# Patient Record
Sex: Female | Born: 1935 | Race: White | Hispanic: No | Smoking: Never smoker
Health system: Southern US, Community
[De-identification: ages and names within clinical notes are randomized; demographics above are authoritative.]

## PROBLEM LIST (undated history)

## (undated) DIAGNOSIS — R600 Localized edema: Secondary | ICD-10-CM

## (undated) DIAGNOSIS — I509 Heart failure, unspecified: Secondary | ICD-10-CM

## (undated) DIAGNOSIS — R630 Anorexia: Secondary | ICD-10-CM

## (undated) DIAGNOSIS — N182 Chronic kidney disease, stage 2 (mild): Secondary | ICD-10-CM

## (undated) DIAGNOSIS — IMO0002 Reserved for concepts with insufficient information to code with codable children: Secondary | ICD-10-CM

## (undated) DIAGNOSIS — E119 Type 2 diabetes mellitus without complications: Secondary | ICD-10-CM

## (undated) DIAGNOSIS — K219 Gastro-esophageal reflux disease without esophagitis: Secondary | ICD-10-CM

## (undated) DIAGNOSIS — R0602 Shortness of breath: Secondary | ICD-10-CM

## (undated) DIAGNOSIS — I219 Acute myocardial infarction, unspecified: Secondary | ICD-10-CM

## (undated) DIAGNOSIS — E785 Hyperlipidemia, unspecified: Secondary | ICD-10-CM

## (undated) DIAGNOSIS — I639 Cerebral infarction, unspecified: Secondary | ICD-10-CM

## (undated) DIAGNOSIS — R413 Other amnesia: Secondary | ICD-10-CM

## (undated) DIAGNOSIS — M1990 Unspecified osteoarthritis, unspecified site: Secondary | ICD-10-CM

## (undated) DIAGNOSIS — I10 Essential (primary) hypertension: Secondary | ICD-10-CM

## (undated) DIAGNOSIS — I2510 Atherosclerotic heart disease of native coronary artery without angina pectoris: Secondary | ICD-10-CM

## (undated) HISTORY — PX: LUMBAR DISC SURGERY: SHX700

## (undated) HISTORY — PX: CHOLECYSTECTOMY: SHX55

## (undated) HISTORY — PX: ABDOMINAL HYSTERECTOMY: SHX81

## (undated) HISTORY — PX: BACK SURGERY: SHX140

## (undated) HISTORY — DX: Other amnesia: R41.3

## (undated) HISTORY — DX: Reserved for concepts with insufficient information to code with codable children: IMO0002

## (undated) HISTORY — PX: APPENDECTOMY: SHX54

## (undated) HISTORY — PX: CARPAL TUNNEL RELEASE: SHX101

## (undated) HISTORY — PX: HAMMER TOE SURGERY: SHX385

## (undated) HISTORY — PX: COLECTOMY: SHX59

## (undated) HISTORY — DX: Anorexia: R63.0

---

## 1999-02-10 ENCOUNTER — Encounter: Admission: RE | Admit: 1999-02-10 | Discharge: 1999-02-10 | Payer: Self-pay | Admitting: Internal Medicine

## 1999-02-10 ENCOUNTER — Encounter: Payer: Self-pay | Admitting: Internal Medicine

## 2000-12-26 ENCOUNTER — Ambulatory Visit (HOSPITAL_COMMUNITY): Admission: RE | Admit: 2000-12-26 | Discharge: 2000-12-26 | Payer: Self-pay | Admitting: Gastroenterology

## 2003-08-13 ENCOUNTER — Encounter: Admission: RE | Admit: 2003-08-13 | Discharge: 2003-08-13 | Payer: Self-pay | Admitting: Internal Medicine

## 2004-09-21 ENCOUNTER — Encounter: Admission: RE | Admit: 2004-09-21 | Discharge: 2004-09-21 | Payer: Self-pay | Admitting: Internal Medicine

## 2004-09-28 ENCOUNTER — Ambulatory Visit: Payer: Self-pay | Admitting: Gastroenterology

## 2004-10-14 ENCOUNTER — Ambulatory Visit: Payer: Self-pay | Admitting: Gastroenterology

## 2006-09-21 ENCOUNTER — Ambulatory Visit: Payer: Self-pay | Admitting: Vascular Surgery

## 2010-05-04 ENCOUNTER — Other Ambulatory Visit: Payer: Self-pay | Admitting: Internal Medicine

## 2010-05-04 DIAGNOSIS — R413 Other amnesia: Secondary | ICD-10-CM

## 2010-05-06 ENCOUNTER — Ambulatory Visit
Admission: RE | Admit: 2010-05-06 | Discharge: 2010-05-06 | Disposition: A | Discharge status: Home / Self Care | Payer: MEDICARE | Source: Ambulatory Visit | Attending: Internal Medicine | Admitting: Internal Medicine

## 2010-05-06 DIAGNOSIS — R413 Other amnesia: Secondary | ICD-10-CM

## 2010-06-28 NOTE — Procedures (Signed)
DUPLEX DEEP VENOUS EXAM - LOWER EXTREMITY   INDICATION:  Rule out DVT.   HISTORY:  Edema:  Right leg for 1 week  Trauma/Surgery:  No  Pain:  Right leg for 1 week  PE:  No  Previous DVT:  No  Anticoagulants:  No  Other:   DUPLEX EXAM:                CFV   SFV   PopV  PTV    GSV                R  L  R  L  R  L  R   L  R  L  Thrombosis    0  0  0  0  0  0  0   0  0  0  Spontaneous   +  +  +     +     +  Phasic        +  +  +     +     +  Augmentation  +  +  +     +     +  Compressible  +  +  +     +     +      +  Competent     +  +  +     +     +   Legend:  + - yes  o - no  p - partial  D - decreased   IMPRESSION:  1. No evidence of right leg DVT.  2. Right popliteal cyst in the distal thigh to popliteal fossa      measuring approximately 1.49 cm AP x      1.88 cm transverse.  3. Dr. Jacky Kindle was notified of these results.    _____________________________  Di Kindle. Edilia Bo, M.D.   DP/MEDQ  D:  09/21/2006  T:  09/22/2006  Job:  [id number]

## 2011-12-06 ENCOUNTER — Inpatient Hospital Stay (HOSPITAL_COMMUNITY)
Admission: AD | Admit: 2011-12-06 | Discharge: 2011-12-10 | DRG: 287 | Disposition: A | Discharge status: Home / Self Care | Payer: Medicare Other | Source: Ambulatory Visit | Attending: Cardiovascular Disease | Admitting: Cardiovascular Disease

## 2011-12-06 ENCOUNTER — Encounter (HOSPITAL_COMMUNITY): Payer: Self-pay | Admitting: Cardiology

## 2011-12-06 DIAGNOSIS — Z7982 Long term (current) use of aspirin: Secondary | ICD-10-CM

## 2011-12-06 DIAGNOSIS — Y9210 Unspecified residential institution as the place of occurrence of the external cause: Secondary | ICD-10-CM | POA: Diagnosis not present

## 2011-12-06 DIAGNOSIS — I2510 Atherosclerotic heart disease of native coronary artery without angina pectoris: Principal | ICD-10-CM | POA: Diagnosis present

## 2011-12-06 DIAGNOSIS — I129 Hypertensive chronic kidney disease with stage 1 through stage 4 chronic kidney disease, or unspecified chronic kidney disease: Secondary | ICD-10-CM | POA: Diagnosis present

## 2011-12-06 DIAGNOSIS — E119 Type 2 diabetes mellitus without complications: Secondary | ICD-10-CM | POA: Diagnosis present

## 2011-12-06 DIAGNOSIS — R600 Localized edema: Secondary | ICD-10-CM

## 2011-12-06 DIAGNOSIS — IMO0002 Reserved for concepts with insufficient information to code with codable children: Secondary | ICD-10-CM | POA: Diagnosis not present

## 2011-12-06 DIAGNOSIS — I10 Essential (primary) hypertension: Secondary | ICD-10-CM | POA: Diagnosis present

## 2011-12-06 DIAGNOSIS — N182 Chronic kidney disease, stage 2 (mild): Secondary | ICD-10-CM

## 2011-12-06 DIAGNOSIS — M129 Arthropathy, unspecified: Secondary | ICD-10-CM | POA: Diagnosis present

## 2011-12-06 DIAGNOSIS — K219 Gastro-esophageal reflux disease without esophagitis: Secondary | ICD-10-CM | POA: Diagnosis present

## 2011-12-06 DIAGNOSIS — Z8673 Personal history of transient ischemic attack (TIA), and cerebral infarction without residual deficits: Secondary | ICD-10-CM

## 2011-12-06 DIAGNOSIS — E785 Hyperlipidemia, unspecified: Secondary | ICD-10-CM

## 2011-12-06 DIAGNOSIS — E876 Hypokalemia: Secondary | ICD-10-CM | POA: Diagnosis present

## 2011-12-06 DIAGNOSIS — Y840 Cardiac catheterization as the cause of abnormal reaction of the patient, or of later complication, without mention of misadventure at the time of the procedure: Secondary | ICD-10-CM | POA: Diagnosis not present

## 2011-12-06 DIAGNOSIS — I200 Unstable angina: Secondary | ICD-10-CM | POA: Diagnosis present

## 2011-12-06 DIAGNOSIS — R609 Edema, unspecified: Secondary | ICD-10-CM | POA: Diagnosis present

## 2011-12-06 DIAGNOSIS — I2582 Chronic total occlusion of coronary artery: Secondary | ICD-10-CM | POA: Diagnosis present

## 2011-12-06 DIAGNOSIS — Z79899 Other long term (current) drug therapy: Secondary | ICD-10-CM

## 2011-12-06 HISTORY — DX: Chronic kidney disease, stage 2 (mild): N18.2

## 2011-12-06 HISTORY — DX: Hyperlipidemia, unspecified: E78.5

## 2011-12-06 HISTORY — DX: Unspecified osteoarthritis, unspecified site: M19.90

## 2011-12-06 HISTORY — DX: Atherosclerotic heart disease of native coronary artery without angina pectoris: I25.10

## 2011-12-06 HISTORY — DX: Cerebral infarction, unspecified: I63.9

## 2011-12-06 HISTORY — DX: Gastro-esophageal reflux disease without esophagitis: K21.9

## 2011-12-06 HISTORY — DX: Essential (primary) hypertension: I10

## 2011-12-06 HISTORY — DX: Localized edema: R60.0

## 2011-12-06 LAB — COMPREHENSIVE METABOLIC PANEL
BUN: 13 mg/dL (ref 6–23)
Calcium: 9.9 mg/dL (ref 8.4–10.5)
Creatinine, Ser: 0.68 mg/dL (ref 0.50–1.10)
GFR calc Af Amer: >90 mL/min (ref >90)
Glucose, Bld: 76 mg/dL (ref 70–99)
Sodium: 140 mEq/L (ref 135–145)
Total Protein: 6.1 g/dL (ref 6.0–8.3)

## 2011-12-06 LAB — PROTIME-INR
INR: 1 (ref 0.00–1.49)
Prothrombin Time: 13.1 seconds (ref 11.6–15.2)

## 2011-12-06 LAB — TROPONIN I: Troponin I: <0.3 ng/mL (ref <0.30)

## 2011-12-06 LAB — CBC WITH DIFFERENTIAL/PLATELET
Basophils Relative: 0 % (ref 0–1)
Eosinophils Absolute: 0.2 10*3/uL (ref 0.0–0.7)
Eosinophils Relative: 3 % (ref 0–5)
Hemoglobin: 12.1 g/dL (ref 12.0–15.0)
MCH: 29.5 pg (ref 26.0–34.0)
MCHC: 35.4 g/dL (ref 30.0–36.0)
Monocytes Relative: 9 % (ref 3–12)
Neutrophils Relative %: 64 % (ref 43–77)

## 2011-12-06 LAB — PRO B NATRIURETIC PEPTIDE: Pro B Natriuretic peptide (BNP): 579.5 pg/mL — ABNORMAL HIGH (ref 0–450)

## 2011-12-06 LAB — MAGNESIUM: Magnesium: 1.4 mg/dL — ABNORMAL LOW (ref 1.5–2.5)

## 2011-12-06 LAB — CK TOTAL AND CKMB (NOT AT ARMC): Relative Index: INVALID (ref 0.0–2.5)

## 2011-12-06 MED ORDER — OMEGA-3-ACID ETHYL ESTERS 1 G PO CAPS
1.0000 g | ORAL_CAPSULE | Freq: Two times a day (BID) | ORAL | Status: DC
  Administered 2011-12-06 – 2011-12-10 (×8): 1 g via ORAL
  Filled 2011-12-06 (×9): qty 1

## 2011-12-06 MED ORDER — ONDANSETRON HCL 4 MG/2ML IJ SOLN: 4.0000 mg | Freq: Four times a day (QID) | INTRAMUSCULAR | Status: DC | PRN

## 2011-12-06 MED ORDER — NITROGLYCERIN 0.4 MG SL SUBL: 0.4000 mg | SUBLINGUAL_TABLET | SUBLINGUAL | Status: DC | PRN

## 2011-12-06 MED ORDER — HEPARIN BOLUS VIA INFUSION
2500.0000 [IU] | Freq: Once | INTRAVENOUS | Status: AC
  Administered 2011-12-06: 2500 [IU] via INTRAVENOUS
  Filled 2011-12-06: qty 2500

## 2011-12-06 MED ORDER — PANTOPRAZOLE SODIUM 40 MG PO TBEC
40.0000 mg | DELAYED_RELEASE_TABLET | Freq: Every day | ORAL | Status: DC
  Administered 2011-12-07 – 2011-12-10 (×4): 40 mg via ORAL
  Filled 2011-12-06 (×4): qty 1

## 2011-12-06 MED ORDER — INSULIN ASPART 100 UNIT/ML ~~LOC~~ SOLN: 0.0000 [IU] | SUBCUTANEOUS | Status: DC

## 2011-12-06 MED ORDER — HEPARIN (PORCINE) IN NACL 100-0.45 UNIT/ML-% IJ SOLN
700.0000 [IU]/h | INTRAMUSCULAR | Status: DC
  Administered 2011-12-06: 550 [IU]/h via INTRAVENOUS
  Filled 2011-12-06 (×2): qty 250

## 2011-12-06 MED ORDER — ASPIRIN 81 MG PO CHEW
324.0000 mg | CHEWABLE_TABLET | ORAL | Status: AC
Start: 2011-12-07 — End: 2011-12-07
  Administered 2011-12-07: 324 mg via ORAL
  Filled 2011-12-06: qty 4

## 2011-12-06 MED ORDER — SODIUM CHLORIDE 0.9 % IJ SOLN: 3.0000 mL | INTRAMUSCULAR | Status: DC | PRN

## 2011-12-06 MED ORDER — METOPROLOL TARTRATE 25 MG PO TABS
25.0000 mg | ORAL_TABLET | Freq: Two times a day (BID) | ORAL | Status: DC
  Administered 2011-12-06 – 2011-12-09 (×6): 25 mg via ORAL
  Filled 2011-12-06 (×8): qty 1

## 2011-12-06 MED ORDER — SODIUM CHLORIDE 0.9 % IV SOLN: 250.0000 mL | INTRAVENOUS | Status: DC | PRN

## 2011-12-06 MED ORDER — ALPRAZOLAM 0.25 MG PO TABS: 0.2500 mg | ORAL_TABLET | Freq: Two times a day (BID) | ORAL | Status: DC | PRN

## 2011-12-06 MED ORDER — INSULIN ASPART 100 UNIT/ML ~~LOC~~ SOLN
0.0000 [IU] | Freq: Three times a day (TID) | SUBCUTANEOUS | Status: DC
  Administered 2011-12-08 – 2011-12-09 (×2): 1 [IU] via SUBCUTANEOUS
  Administered 2011-12-09: 13:00:00 via SUBCUTANEOUS
  Administered 2011-12-10 (×2): 2 [IU] via SUBCUTANEOUS

## 2011-12-06 MED ORDER — ASPIRIN EC 81 MG PO TBEC
81.0000 mg | DELAYED_RELEASE_TABLET | Freq: Every day | ORAL | Status: DC
  Administered 2011-12-08 – 2011-12-10 (×3): 81 mg via ORAL
  Filled 2011-12-06 (×4): qty 1

## 2011-12-06 MED ORDER — SODIUM CHLORIDE 0.9 % IV SOLN: INTRAVENOUS | Status: DC

## 2011-12-06 MED ORDER — ATORVASTATIN CALCIUM 10 MG PO TABS
10.0000 mg | ORAL_TABLET | Freq: Every day | ORAL | Status: DC
  Filled 2011-12-06: qty 1

## 2011-12-06 MED ORDER — SODIUM CHLORIDE 0.9 % IV SOLN
INTRAVENOUS | Status: DC
  Administered 2011-12-06: 20:00:00 via INTRAVENOUS

## 2011-12-06 MED ORDER — DIPHENHYDRAMINE HCL 25 MG PO CAPS: 25.0000 mg | ORAL_CAPSULE | Freq: Every evening | ORAL | Status: DC | PRN

## 2011-12-06 MED ORDER — ACETAMINOPHEN 325 MG PO TABS: 650.0000 mg | ORAL_TABLET | ORAL | Status: DC | PRN

## 2011-12-06 MED ORDER — SODIUM CHLORIDE 0.9 % IJ SOLN: 3.0000 mL | Freq: Two times a day (BID) | INTRAMUSCULAR | Status: DC

## 2011-12-06 NOTE — Progress Notes (Signed)
ANTICOAGULATION CONSULT NOTE - Initial Consult  Pharmacy Consult for heparin Indication: chest pain/ACS  No Known Allergies  Patient Measurements:   Heparin Dosing Weight: 40 kg  Vital Signs: Temp: 98.3 F (36.8 C) (10/23 1930) Temp src: Oral (10/23 1930) BP: 207/84 mmHg (10/23 1930) Pulse Rate: 66  (10/23 1930)  Labs: No results found for this basename: HGB:2,HCT:3,PLT:3,APTT:3,LABPROT:3,INR:3,HEPARINUNFRC:3,CREATININE:3,CKTOTAL:3,CKMB:3,TROPONINI:3 in the last 72 hours  CrCl is unknown because no creatinine reading has been taken and the patient has no height on file.   Medical History: Past Medical History  Diagnosis Date  . Hypertension   . Diabetes mellitus without complication   . Arthritis   . Stroke   . GERD (gastroesophageal reflux disease)   . Chronic kidney disease   . Unstable angina 12/06/2011  . CKD (chronic kidney disease) stage 3, GFR 30-59 ml/min 12/06/2011  . CKD (chronic kidney disease) stage 2, GFR 60-89 ml/min 12/06/2011  . Edema leg 12/06/2011  . Hyperlipidemia 12/06/2011    Medications:  Prescriptions prior to admission  Medication Sig Dispense Refill  . aspirin 81 MG chewable tablet Chew 81 mg by mouth daily.      . fish oil-omega-3 fatty acids 1000 MG capsule Take 1 g by mouth 2 (two) times daily.      Marland Kitchen glipiZIDE (GLUCOTROL) 10 MG tablet Take 10 mg by mouth daily.      . metFORMIN (GLUCOPHAGE) 500 MG tablet Take 500 mg by mouth 2 (two) times daily with a meal.      . Multiple Vitamin (MULTIVITAMIN WITH MINERALS) TABS Take 1 tablet by mouth daily.      Marland Kitchen omeprazole (PRILOSEC) 20 MG capsule Take 20 mg by mouth daily.      . propranolol-hydrochlorothiazide (INDERIDE) 40-25 MG per tablet Take 1 tablet by mouth daily.        Assessment: 78 year old woman to be admitted for ACS.  Heparin IV to start. Goal of Therapy:  Heparin level 0.3-0.7 units/ml Monitor platelets by anticoagulation protocol: Yes   Plan:  Give 2500 units bolus x  1 Start heparin infusion at 550 units/hr Check anti-Xa level in 8 hours and daily while on heparin Continue to monitor H&H and platelets  Mickeal Skinner 12/06/2011,7:46 PM

## 2011-12-06 NOTE — H&P (Signed)
Julia [name] is an 78 y.o. female.   Chief Complaint: chest pain, DOE  HPI: 78 yo, [name] with 2 week episodes of chest and neck pressure associated with DOE.  On Sunday several episodes while walking in [employer],   Significantly SOB, but with rest would resolve.  But every time she exerted the DOE would return along with neck and chest pressure.  Another episode occurred yesterday.   In the office with minimal activity she is symptom free.  She was seen by Dr. Allyson Sabal in the office and felt this was significant unstable angina and needed hospitalization.  She did have an echo here  In the office with normal EF and essentially normal Echo.  Admitting to CCU.  Past Medical History  Diagnosis Date  . Hypertension   . Diabetes mellitus without complication   . Arthritis   . Stroke   . GERD (gastroesophageal reflux disease)   . Chronic kidney disease   . Unstable angina 12/06/2011  . CKD (chronic kidney disease) stage 3, GFR 30-59 ml/min 12/06/2011  . CKD (chronic kidney disease) stage 2, GFR 60-89 ml/min 12/06/2011  . Edema leg 12/06/2011  . Hyperlipidemia 12/06/2011    History reviewed. No pertinent past surgical history.  History reviewed. No pertinent family history. Social History:  reports that she has never smoked. She has never used smokeless tobacco. She reports that she does not drink alcohol or use illicit drugs. Widowed 3 children, 5 grandchildren  Allergies: No Known Allergies  OUTPATIENT MEDICATIONS: ASA 81 mg daily Propanolol 40/25 mg daily glucophage 500 mg BID Multivitamin Nexium 40 mg daily Fish oil 1000mg  BID    No results found for this or any previous visit (from the past 48 hour(s)). No results found.  ROS: General:no colds or fevers,no wt changes  Skin:no rashes or ulcers HEENT:no blurred vision ZO:XWRUE pressure as described PUL:DOE GI:no nausea or vomiting, no indigestion though with hx of GERD, no diarrhea, no melena GU:no hematuria, no  dysuria MS:+ arthritis Neuro:near syncope with DOE on Sunday Endo:+ DM, no thyroid disease   VS: BP 150/80 Lt  150/80 Rt  P 88  R P  SP02 97%, room air,   Weight  88 pounds PE: General:Alert and oriented, pleasant affect, NAD Skin:warm and dry, brisk capillary refill HEENT:normocephalic, sclera clear Neck:supple, no JVD, no bruits Heart:S1S2 RRR, without murmur gallup rub or click Lungs:clear without rales, rhonchi or wheezes Abd:+ BS, soft non tender Ext:no edema Neuro:alert and oriented X 3, MAE, follows commands    Assessment/Plan Principal Problem:  *Unstable angina Active Problems:  HTN (hypertension)  CKD (chronic kidney disease) stage 2, GFR 60-89 ml/min  Edema leg  Hyperlipidemia  PLAN:admit, rule out MI with cardiac enzymes, IV heparin, sl NTG (though without ambulation hopefully no symptoms)  Will change propranolol to lopressor for now and hold HCTZ with cardiac cath for tomorrow. Admit to step down for close observation.  Hx stage 2 CKD and diabetes and hyperlipidemia which make for high risk CAD.   AVWUJW,JXBJY R 12/06/2011, 6:16 PM    Agree with note written by Julia [name] RNP  Pt with + CRF and s/o progressive DOE and sscp with presyncope. Exam benign. EKG w/o acute changes. Plan admit to step down, IV NTG/Hep. Diagnostic cath in AM. Preliminary 2D in office showed nl LV fxn with signif LVH.  Runell Gess 12/06/2011 9:28 PM

## 2011-12-07 ENCOUNTER — Encounter (HOSPITAL_COMMUNITY): Payer: Self-pay | Admitting: Cardiology

## 2011-12-07 ENCOUNTER — Encounter (HOSPITAL_COMMUNITY)
Admission: AD | Disposition: A | Discharge status: Home / Self Care | Payer: Self-pay | Source: Ambulatory Visit | Attending: Cardiovascular Disease

## 2011-12-07 ENCOUNTER — Ambulatory Visit (HOSPITAL_COMMUNITY): Admit: 2011-12-07 | Payer: Self-pay | Admitting: Cardiology

## 2011-12-07 HISTORY — PX: CARDIAC CATHETERIZATION: SHX172

## 2011-12-07 HISTORY — PX: LEFT HEART CATHETERIZATION WITH CORONARY ANGIOGRAM: SHX5451

## 2011-12-07 LAB — GLUCOSE, CAPILLARY
Glucose-Capillary: 67 mg/dL — ABNORMAL LOW (ref 70–99)
Glucose-Capillary: 68 mg/dL — ABNORMAL LOW (ref 70–99)
Glucose-Capillary: 73 mg/dL (ref 70–99)
Glucose-Capillary: 95 mg/dL (ref 70–99)
Glucose-Capillary: 96 mg/dL (ref 70–99)

## 2011-12-07 LAB — TROPONIN I: Troponin I: <0.3 ng/mL (ref <0.30)

## 2011-12-07 LAB — BASIC METABOLIC PANEL
CO2: 29 mEq/L (ref 19–32)
Glucose, Bld: 68 mg/dL — ABNORMAL LOW (ref 70–99)
Potassium: 3.1 mEq/L — ABNORMAL LOW (ref 3.5–5.1)
Sodium: 137 mEq/L (ref 135–145)

## 2011-12-07 LAB — HEMOGLOBIN A1C
Hgb A1c MFr Bld: 7 % — ABNORMAL HIGH (ref <5.7)
Mean Plasma Glucose: 154 mg/dL — ABNORMAL HIGH (ref <117)

## 2011-12-07 LAB — LIPID PANEL
Cholesterol: 207 mg/dL — ABNORMAL HIGH (ref 0–200)
HDL: 37 mg/dL — ABNORMAL LOW (ref >39)
Total CHOL/HDL Ratio: 5.6 RATIO
Triglycerides: 276 mg/dL — ABNORMAL HIGH (ref <150)
VLDL: 55 mg/dL — ABNORMAL HIGH (ref 0–40)

## 2011-12-07 LAB — CBC
HCT: 35.4 % — ABNORMAL LOW (ref 36.0–46.0)
MCH: 29.1 pg (ref 26.0–34.0)
MCHC: 34.7 g/dL (ref 30.0–36.0)
MCV: 83.9 fL (ref 78.0–100.0)
RDW: 12.3 % (ref 11.5–15.5)

## 2011-12-07 LAB — POCT ACTIVATED CLOTTING TIME: Activated Clotting Time: 135 seconds

## 2011-12-07 SURGERY — LEFT HEART CATHETERIZATION WITH CORONARY ANGIOGRAM
Anesthesia: LOCAL

## 2011-12-07 MED ORDER — TICAGRELOR 90 MG PO TABS
90.0000 mg | ORAL_TABLET | Freq: Two times a day (BID) | ORAL | Status: DC
  Administered 2011-12-08: 90 mg via ORAL
  Filled 2011-12-07 (×2): qty 1

## 2011-12-07 MED ORDER — BIVALIRUDIN 250 MG IV SOLR
INTRAVENOUS | Status: AC
  Filled 2011-12-07: qty 250

## 2011-12-07 MED ORDER — HEPARIN (PORCINE) IN NACL 2-0.9 UNIT/ML-% IJ SOLN
INTRAMUSCULAR | Status: AC
  Filled 2011-12-07: qty 1000

## 2011-12-07 MED ORDER — FENTANYL CITRATE 0.05 MG/ML IJ SOLN
INTRAMUSCULAR | Status: AC
  Filled 2011-12-07: qty 2

## 2011-12-07 MED ORDER — SODIUM CHLORIDE 0.9 % IV SOLN: 1.0000 mL/kg/h | INTRAVENOUS | Status: AC

## 2011-12-07 MED ORDER — SODIUM CHLORIDE 0.9 % IJ SOLN: 3.0000 mL | Freq: Two times a day (BID) | INTRAMUSCULAR | Status: DC

## 2011-12-07 MED ORDER — HEPARIN (PORCINE) IN NACL 100-0.45 UNIT/ML-% IJ SOLN
700.0000 [IU]/h | INTRAMUSCULAR | Status: DC
  Filled 2011-12-07: qty 250

## 2011-12-07 MED ORDER — HEPARIN (PORCINE) IN NACL 100-0.45 UNIT/ML-% IJ SOLN
700.0000 [IU]/h | INTRAMUSCULAR | Status: DC
  Administered 2011-12-08: 700 [IU]/h via INTRAVENOUS
  Filled 2011-12-07: qty 250

## 2011-12-07 MED ORDER — ATORVASTATIN CALCIUM 80 MG PO TABS
80.0000 mg | ORAL_TABLET | Freq: Every day | ORAL | Status: DC
  Administered 2011-12-07 – 2011-12-09 (×3): 80 mg via ORAL
  Filled 2011-12-07 (×4): qty 1

## 2011-12-07 MED ORDER — DEXTROSE-NACL 5-0.45 % IV SOLN
INTRAVENOUS | Status: DC
  Administered 2011-12-07: 09:00:00 via INTRAVENOUS

## 2011-12-07 MED ORDER — PANTOPRAZOLE SODIUM 40 MG PO TBEC: 40.0000 mg | DELAYED_RELEASE_TABLET | Freq: Every day | ORAL | Status: DC

## 2011-12-07 MED ORDER — ACETAMINOPHEN 325 MG PO TABS
650.0000 mg | ORAL_TABLET | ORAL | Status: DC | PRN
  Administered 2011-12-07: 650 mg via ORAL
  Filled 2011-12-07 (×2): qty 2

## 2011-12-07 MED ORDER — ONDANSETRON HCL 4 MG/2ML IJ SOLN: 4.0000 mg | Freq: Four times a day (QID) | INTRAMUSCULAR | Status: DC | PRN

## 2011-12-07 MED ORDER — ZOLPIDEM TARTRATE 5 MG PO TABS: 5.0000 mg | ORAL_TABLET | Freq: Every evening | ORAL | Status: DC | PRN

## 2011-12-07 MED ORDER — ASPIRIN 81 MG PO CHEW: 81.0000 mg | CHEWABLE_TABLET | Freq: Every day | ORAL | Status: DC

## 2011-12-07 MED ORDER — TICAGRELOR 90 MG PO TABS
180.0000 mg | ORAL_TABLET | Freq: Once | ORAL | Status: AC
  Administered 2011-12-07: 180 mg via ORAL
  Filled 2011-12-07: qty 2

## 2011-12-07 MED ORDER — MIDAZOLAM HCL 2 MG/2ML IJ SOLN
INTRAMUSCULAR | Status: AC
  Filled 2011-12-07: qty 2

## 2011-12-07 MED ORDER — NITROGLYCERIN 0.2 MG/ML ON CALL CATH LAB
INTRAVENOUS | Status: AC
  Filled 2011-12-07: qty 1

## 2011-12-07 MED ORDER — SODIUM CHLORIDE 0.9 % IV SOLN
250.0000 mL | INTRAVENOUS | Status: DC
  Administered 2011-12-08: 250 mL via INTRAVENOUS

## 2011-12-07 MED ORDER — POTASSIUM CHLORIDE CRYS ER 20 MEQ PO TBCR
40.0000 meq | EXTENDED_RELEASE_TABLET | Freq: Once | ORAL | Status: AC
  Administered 2011-12-07: 40 meq via ORAL
  Filled 2011-12-07: qty 2

## 2011-12-07 MED ORDER — SODIUM CHLORIDE 0.9 % IJ SOLN: 3.0000 mL | INTRAMUSCULAR | Status: DC | PRN

## 2011-12-07 MED ORDER — LIDOCAINE HCL (PF) 1 % IJ SOLN
INTRAMUSCULAR | Status: AC
  Filled 2011-12-07: qty 30

## 2011-12-07 MED ORDER — HEPARIN BOLUS VIA INFUSION
1000.0000 [IU] | Freq: Once | INTRAVENOUS | Status: AC
  Administered 2011-12-07: 1000 [IU] via INTRAVENOUS
  Filled 2011-12-07: qty 1000

## 2011-12-07 NOTE — CV Procedure (Signed)
[facility] CARDIAC CATHETERIZATION REPORT  NAME:  Julia [name]   MRN: [id number] DOB:  03-14-1927   ADMIT DATE: 12/06/2011  INTERVENTIONAL CARDIOLOGIST: Marykay Lex, M.D., MS PRIMARY CARE PROVIDER: Minda Meo, MD PRIMARY CARDIOLOGIST:   PATIENT:  Julia [name] is a 78 y.o. female with 2 week episodes of chest and neck pressure associated with DOE. On Sunday several episodes while walking in [employer], Significantly SOB, but with rest would resolve. But every time she exerted the DOE would return along with neck and chest pressure. Another episode occurred yesterday. In the office with minimal activity she is symptom free.  She was seen by Dr. Allyson Sabal in the office and felt this was significant unstable angina and needed hospitalization. She did have an echo In the office with normal EF and essentially normal Echo.  She was admitted with the plan for LHC with possible PCI today.  PRE-OPERATIVE DIAGNOSIS:    Unstable Angina  PROCEDURES PERFORMED:    Left Heart Catheterization with Native Coronary Angiography via 5 Fr Right Common Femoral Artery access  Left Ventriculography in RAO progection.  PROCEDURE: Consent: Risks of procedure as well as the alternatives and risks of each were explained to the (patient/caregiver).  Consent for procedure obtained. Consent for signed by MD and patient with RN witness -- placed on chart.  PROCEDURE: The patient was brought to the 2nd Floor [facility] in the fasting state and prepped and draped in the usual sterile fashion for Right groin access. Sterile technique was used including antiseptics, cap, gloves, gown, hand hygiene, mask and sheet.  Skin prep: Chlorhexidine  Time Out: Verified patient identification, verified procedure, site/side was marked, verified correct patient position, special equipment/implants available, medications/allergies/relevent history reviewed, required  imaging and test results available.  Performed  Access: Right Common Femora Artery; 5 Fr Sheath -- Fluoroscopically guided Seldinger Technique. Diagnostic:  Catheters advanced and exchanged over standard 0.35 J wire  Left Coronary Artery Angiography: JL4  Right Coronary Artery Angiography: JR4  LV Hemodynamics (LV Gram): Angled pigtail; LV Gram performed using hand injection of 12ml  Hemodynamics:  Central Aortic / Mean Pressures: 118/44 mmHg; 70 mmHg  Left Ventricular Pressures / EDP: 117/4 mmHg; 10 mmHg  Left Ventriculography:  EF: ~65 %  Wall Motion: normal  Coronary Anatomy:  Left Main: Large caliber vessel with an "upward takeoff" that bifurcates normally into the LAD and Circumflex; angiographically normal LAD: Large caliber vessel, heavily calcified proximally with mild luminal irregularities; shortly after a small SP1 and a larger SP2, the vessel is ~50-60% stenosed before and ~99-100% (subtotally occluded) after D1.  This bifurcation is calcified.  The distal LAD is filled via "homocollaterals" from the SP and D1 branches.  There is TIMI 1 flow from the "re-constitution" site (~12-42mm beyond the occlusion) distally to the apex  D1: Small to moderate caliber vessel, minimal ostial involvement in the LAD stenosis.  The vessel bifurcates with minimal luminal irregularities. Left Circumflex: Large caliber vessel with 2 small OM branches proximally before giving off OM1 in the AV Groove.  The vessel then continues in the AV Groove where it bifurcates into OM2 and the Left posterolateral system with OM3 and a bifurcating LPL (LPL1&2)  OM1: Small caliber, proximal ~50%  OM 2: Small to moderate caliber, proximal eccentric ~60%  OM 3: Small to moderate caliber, minimal luminal irregularities.  RCA: Moderate-to-large Caliber, "domininant" vessel with ~50-60% irregular stenosis in the mid vessel just proximal to the "  genu".  The vessel continues on as the RPDA with a minimal RPL  branch providing the AV Nodal artery.  RPDA: moderate caliber vessel that tapers to the apex; mid ~50-60% short tubular stenosis, otherwise minimal luminal irregularities.  ANESTHESIA:   Local Lidocaine 16 ml SEDATION:  1 mg IV Versed, 25 mcg IV fentanyl MEDICATIONS: Omnipaque 55ml  EBL:   < 10 ml  PATIENT DISPOSITION:    The patient was transferred to the PACU holding area in a hemodynamicaly stable, chest pain free condition.  The patient tolerated the procedure well, and there were no complications.  The patient was stable before, during, and after the procedure.  The Sheath will be removed in the PACU with direct manual pressure for hemostasis  POST-OPERATIVE DIAGNOSIS:    Severe single vessel CAD with what appears to be sub-acute subtotal occlusion of the mid LAD after the take-off of D1 and SP2 in a calcified segment of vessel.    Moderate mid RCA stenosis  Preserved LVEF with normal LVEDP with no WMA noted on LV Gram or Echocardiogram  With normal LAD territory wall motion, there is reason to presume that this distribution is viable and is therefore the most obvious "culprit lesion"  PLAN OF CARE:  At the time of planning to move forward with possible PCI of the LAD, I was alerted to a potential emergency "acute MI" patient in the [facility] ER.  As [name] condition is far less critical, I felt if prudent to abort the plan to proceed with PCI, in order to be available for the ER patient.  After it was determined that ER patient could be stabilized without emergent catheterization, I reviewed the films again to decide whether to proceed with PCI.  I am somewhat concerned about the extent of calcification at the occlusion site, and feel that it is prudent to post-pone the PCI until the films can be reviewed by my colleague, Dr. Tresa Endo, to determine if Rotablator therapy may be beneficial for this lesion.    Standard post-diagnostic catheterization care,restarting Heparin  8 hrs after hemostasis  Will load with Brilinta tonite, and as Dr. Tresa Endo to review th films in the AM to plan "staged" PCI of the Mid LAD and determine if Rotablation will be necessary.  I did discuss the case in detail with the patient and her son & daughter.  I have also discussed her with Drs. Allyson Sabal and Jacky Kindle who are in agreement with the plan.   Marykay Lex, M.D., M.S. [facility] [address] [place], [place]  [address]  [phone]  12/07/2011 7:53 PM

## 2011-12-07 NOTE — Progress Notes (Signed)
Inpatient Diabetes Program Recommendations  AACE/ADA: New Consensus Statement on Inpatient Glycemic Control (2013)  Target Ranges:  Prepandial:   less than 140 mg/dL      Peak postprandial:   less than 180 mg/dL (1-2 hours)      Critically ill patients:  140 - 180 mg/dL   Novolog orders need clarification.  TID and Q 4 coverage is entered.  So far today, patient is not needing any insulin.    Inpatient Diabetes Program Recommendations Correction (SSI): DC Sensitive correction scale TID if patient is NPO use Q 4  Thank you  Piedad Climes RN,BSN,CDE Inpatient Diabetes Coordinator

## 2011-12-07 NOTE — Progress Notes (Signed)
ANTICOAGULATION CONSULT NOTE  Pharmacy Consult for heparin Indication: chest pain/ACS  No Known Allergies  Patient Measurements: Height: 4\' 8"  (142.2 cm) Weight: 85 lb 12.1 oz (38.9 kg) IBW/kg (Calculated) : 36.3  Heparin Dosing Weight: 40 kg  Vital Signs: Temp: 98.4 F (36.9 C) (10/24 1154) Temp src: Oral (10/24 1154) BP: 163/54 mmHg (10/24 1154) Pulse Rate: 52  (10/24 1437)  Labs:  Basename 12/07/11 0800 12/07/11 0314 12/07/11 0313 12/06/11 2203 12/06/11 2202  HGB 12.3 -- -- -- 12.1  HCT 35.4* -- -- -- 34.2*  PLT 186 -- -- -- 191  APTT -- -- -- -- 38*  LABPROT -- 13.7 -- -- 13.1  INR -- 1.06 -- -- 1.00  HEPARINUNFRC 0.53 0.23* -- -- --  CREATININE -- 0.65 -- -- 0.68  CKTOTAL -- -- -- 57 --  CKMB -- -- -- 3.1 --  TROPONINI <0.30 -- <0.30 <0.30 --    Estimated Creatinine Clearance: 31.1 ml/min (by C-G formula based on Cr of 0.65).  Assessment: 78 year old female with chest pain was admitted for cardiac work up.  She was started on IV heparin last night at heparin rate of 700 units/hr.  Her heparin level this morning was 0.54 which is within the desired goal range.  She is without noted bleeding complications and plans are for cardiac cath.  Her CBC is stable as well as her platelets.  Goal of Therapy:  Heparin level 0.3-0.7 units/ml Monitor platelets by anticoagulation protocol: Yes   Plan:  1.  Continue current heparin therapy at 700 units/hr 2.  F/U after cath   Nadara Mustard, PharmD., MS Clinical Pharmacist Pager:  [phone] Thank you for allowing pharmacy to be part of this patients care team. 12/07/2011,3:13 PM

## 2011-12-07 NOTE — Progress Notes (Signed)
To the cath lab by bed. Stable. 

## 2011-12-07 NOTE — Progress Notes (Signed)
Pt CBG 67 at 0340. Pt given 4 oz orange juice. Pt CBG 89 at 0400. Pt resting comfortably. Will continue to monitor.

## 2011-12-07 NOTE — Progress Notes (Signed)
ANTICOAGULATION CONSULT NOTE  Pharmacy Consult for heparin Indication: chest pain/ACS  No Known Allergies   Labs:  Basename 12/07/11 0800 12/07/11 0314 12/07/11 0313 12/06/11 2203 12/06/11 2202  HGB 12.3 -- -- -- 12.1  HCT 35.4* -- -- -- 34.2*  PLT 186 -- -- -- 191  APTT -- -- -- -- 38*  LABPROT -- 13.7 -- -- 13.1  INR -- 1.06 -- -- 1.00  HEPARINUNFRC 0.53 0.23* -- -- --  CREATININE -- 0.65 -- -- 0.68  CKTOTAL -- -- -- 57 --  CKMB -- -- -- 3.1 --  TROPONINI <0.30 -- <0.30 <0.30 --    Estimated Creatinine Clearance: 31.1 ml/min (by C-G formula based on Cr of 0.65).  Assessment: 78 year old female with chest pain was admitted for cardiac work up.    S/p cath with restart of heparin 8 hours s/p sheath removal  Goal of Therapy:  Heparin level 0.3-0.7 units/ml Monitor platelets by anticoagulation protocol: Yes   Plan:  1.  Continue current heparin therapy at 700 units/hr (to start at 3 AM) 2.  PCI planned for tomorrow at 12 noon  Okey Regal, PharmD [phone]  12/07/2011,5:38 PM

## 2011-12-07 NOTE — Progress Notes (Signed)
ANTICOAGULATION CONSULT NOTE  Pharmacy Consult for heparin Indication: chest pain/ACS  No Known Allergies  Patient Measurements: Height: 4\' 8"  (142.2 cm) Weight: 88 lb (39.917 kg) IBW/kg (Calculated) : 36.3  Heparin Dosing Weight: 40 kg  Vital Signs: Temp: 97.8 F (36.6 C) (10/24 0338) Temp src: Oral (10/24 0338) BP: 142/58 mmHg (10/24 0338) Pulse Rate: 54  (10/24 0338)  Labs:  Basename 12/07/11 0314 12/07/11 0313 12/06/11 2203 12/06/11 2202  HGB -- -- -- 12.1  HCT -- -- -- 34.2*  PLT -- -- -- 191  APTT -- -- -- 38*  LABPROT 13.7 -- -- 13.1  INR 1.06 -- -- 1.00  HEPARINUNFRC 0.23* -- -- --  CREATININE 0.65 -- -- 0.68  CKTOTAL -- -- 57 --  CKMB -- -- 3.1 --  TROPONINI -- <0.30 <0.30 --    Estimated Creatinine Clearance: 31.1 ml/min (by C-G formula based on Cr of 0.65).  Assessment: 78 year old gemale with chest pain for Heparin. Goal of Therapy:  Heparin level 0.3-0.7 units/ml Monitor platelets by anticoagulation protocol: Yes   Plan:  Heparin 1000 units IV bolus, then increase heparin 700 units/hr F/U after cath  Lafayette Dunlevy, Gary Fleet 12/07/2011,4:37 AM

## 2011-12-07 NOTE — Progress Notes (Addendum)
[facility] DAILY PROGRESS NOTE  NAME:  Julia [name]  MRN: [id number] DOB:  07/14/1927   ADMIT DATE: 12/06/2011  Patient Description   78 y.o. female with PMH below: presented with SSx c/w unstable (crescendo) angina -- DOE & chest / neck pressure with minimal exertion.   Past Medical History  Diagnosis Date  . Hypertension   . Diabetes mellitus without complication   . Arthritis   . Stroke   . GERD (gastroesophageal reflux disease)   . Chronic kidney disease   . Unstable angina 12/06/2011  . CKD (chronic kidney disease) stage 2, GFR 60-89 ml/min 12/06/2011  . Edema leg 12/06/2011  . Hyperlipidemia 12/06/2011   Length of Stay:  LOS: 1 day   Subjective:   Today Julia [name] is resting comfortably.  No CP or SOB overnight while on bed rest. Did have a leg cramp last PM.  Objective:  Temp:  [97.3 F (36.3 C)-98.3 F (36.8 C)] 97.8 F (36.6 C) (10/24 0338) Pulse Rate:  [52-66] 57  (10/24 0400) Resp:  [15-25] 19  (10/24 0400) BP: (134-207)/(47-84) 165/47 mmHg (10/24 0801) SpO2:  [95 %-99 %] 95 % (10/24 0400) Weight:  [38.9 kg (85 lb 12.1 oz)-39.917 kg (88 lb)] 38.9 kg (85 lb 12.1 oz) (10/24 0500) Weight change:  Physical Exam: General appearance: alert, cooperative, appears stated age and thin & frail elderly woman. Lungs: clear to auscultation bilaterally and normal percussion bilaterally Heart: regular rate and rhythm, S1, S2 normal, no murmur, click, rub or gallop Extremities: extremities normal, atraumatic, no cyanosis or edema Pulses: 2+ and symmetric; no femoral bruit.  Intake/Output from previous day: 10/23 0701 - 10/24 0700 In: 329.5 [I.V.:329.5] Out: 2 [Urine:2]  Intake/Output Summary (Last 24 hours) at 12/07/11 0803 Last data filed at 12/07/11 0700  Gross per 24 hour  Intake  329.5 ml  Output      2 ml  Net  327.5 ml    Lab 12/07/11 0314 12/06/11 2202  NA 137 140  K 3.1* 3.0*  CL 100 103  CO2 29 28  BUN 13 13    CREATININE 0.65 0.68     Lab 12/06/11 2202  WBC 5.6  HGB 12.1  HCT 34.2*  PLT 191   No components found with this basename: TROP:3, BNP:3  Imaging:  Echo: from office - nl LVEF & no WMA.  MAR Reviewed  Assessment/Plan:  Principal Problem:  *Unstable angina Active Problems:  HTN (hypertension)  CKD (chronic kidney disease) stage 2, GFR 60-89 ml/min  Edema leg  Hyperlipidemia  Admitted overnight for SSx concerning for progressive / crescendo angina -- Unstable Angina -- following clinic visit with Dr. Allyson Sabal.   Hydrated o/n due to ~CKD2.   Plan is LHC +/- PCI today. Will replete K+.  Performing MD:  Marykay Lex, M.D., M.S.  Procedure:  Left Heart Catheterization with Coronary Angiography +/- Percutaneous Coronary Intervention.  The procedure with Risks/Benefits/Alternatives and Indications was reviewed with the patient.  All questions were answered.    Risks / Complications include, but not limited to: Death, MI, CVA/TIA, VF/VT (with defibrillation), Bradycardia (need for temporary pacer placement), contrast induced nephropathy - increased risk with CKD2, bleeding / bruising / hematoma / pseudoaneurysm, vascular or coronary injury (with possible emergent CT or Vascular Surgery), adverse medication reactions, infection.    The patient voice understanding and agree to proceed.   I have signed the consent form and placed it on the chart for patient signature  and RN witness.    Marykay Lex, M.D., M.S. [facility] [address] [place], [place]  [address]  [phone]  12/07/2011 8:05 AM   Time Spent Directly with Patient:  15 minutes

## 2011-12-08 ENCOUNTER — Encounter (HOSPITAL_COMMUNITY)
Admission: AD | Disposition: A | Discharge status: Home / Self Care | Payer: Self-pay | Source: Ambulatory Visit | Attending: Cardiovascular Disease

## 2011-12-08 ENCOUNTER — Encounter (HOSPITAL_COMMUNITY): Payer: Self-pay | Admitting: Cardiology

## 2011-12-08 DIAGNOSIS — I2510 Atherosclerotic heart disease of native coronary artery without angina pectoris: Secondary | ICD-10-CM

## 2011-12-08 DIAGNOSIS — E876 Hypokalemia: Secondary | ICD-10-CM | POA: Diagnosis present

## 2011-12-08 HISTORY — PX: CORONARY ANGIOPLASTY WITH STENT PLACEMENT: SHX49

## 2011-12-08 HISTORY — DX: Atherosclerotic heart disease of native coronary artery without angina pectoris: I25.10

## 2011-12-08 HISTORY — PX: PERCUTANEOUS CORONARY STENT INTERVENTION (PCI-S): SHX5485

## 2011-12-08 LAB — BASIC METABOLIC PANEL
BUN: 10 mg/dL (ref 6–23)
CO2: 22 mEq/L (ref 19–32)
Chloride: 98 mEq/L (ref 96–112)
GFR calc Af Amer: >90 mL/min (ref >90)
Glucose, Bld: 112 mg/dL — ABNORMAL HIGH (ref 70–99)
Potassium: 3.5 mEq/L (ref 3.5–5.1)
Sodium: 134 mEq/L — ABNORMAL LOW (ref 135–145)

## 2011-12-08 LAB — CBC
HCT: 35.7 % — ABNORMAL LOW (ref 36.0–46.0)
HCT: 37.4 % (ref 36.0–46.0)
Hemoglobin: 12.7 g/dL (ref 12.0–15.0)
Hemoglobin: 13.5 g/dL (ref 12.0–15.0)
MCH: 29.9 pg (ref 26.0–34.0)
MCHC: 35.6 g/dL (ref 30.0–36.0)
MCHC: 36.1 g/dL — ABNORMAL HIGH (ref 30.0–36.0)
RBC: 4.29 MIL/uL (ref 3.87–5.11)
RBC: 4.51 MIL/uL (ref 3.87–5.11)
WBC: 7.7 10*3/uL (ref 4.0–10.5)
WBC: 8.4 10*3/uL (ref 4.0–10.5)

## 2011-12-08 LAB — GLUCOSE, CAPILLARY
Glucose-Capillary: 122 mg/dL — ABNORMAL HIGH (ref 70–99)
Glucose-Capillary: 73 mg/dL (ref 70–99)
Glucose-Capillary: 96 mg/dL (ref 70–99)

## 2011-12-08 LAB — POCT ACTIVATED CLOTTING TIME: Activated Clotting Time: 539 seconds

## 2011-12-08 LAB — HEMOGLOBIN AND HEMATOCRIT, BLOOD
HCT: 31.1 % — ABNORMAL LOW (ref 36.0–46.0)
Hemoglobin: 10.9 g/dL — ABNORMAL LOW (ref 12.0–15.0)

## 2011-12-08 SURGERY — PERCUTANEOUS CORONARY STENT INTERVENTION (PCI-S)
Anesthesia: LOCAL | Laterality: Bilateral

## 2011-12-08 MED ORDER — DIAZEPAM 2 MG PO TABS
2.0000 mg | ORAL_TABLET | ORAL | Status: AC
  Administered 2011-12-08: 2 mg via ORAL
  Filled 2011-12-08: qty 1

## 2011-12-08 MED ORDER — FENTANYL CITRATE 0.05 MG/ML IJ SOLN
INTRAMUSCULAR | Status: AC
  Filled 2011-12-08: qty 2

## 2011-12-08 MED ORDER — ISOSORBIDE MONONITRATE ER 30 MG PO TB24
30.0000 mg | ORAL_TABLET | Freq: Every day | ORAL | Status: DC
  Administered 2011-12-08 – 2011-12-10 (×3): 30 mg via ORAL
  Filled 2011-12-08 (×3): qty 1

## 2011-12-08 MED ORDER — SODIUM CHLORIDE 0.9 % IJ SOLN: 3.0000 mL | INTRAMUSCULAR | Status: DC | PRN

## 2011-12-08 MED ORDER — ASPIRIN 81 MG PO CHEW
324.0000 mg | CHEWABLE_TABLET | ORAL | Status: DC
Start: 2011-12-09 — End: 2011-12-08
  Filled 2011-12-08: qty 4

## 2011-12-08 MED ORDER — ASPIRIN EC 81 MG PO TBEC: 81.0000 mg | DELAYED_RELEASE_TABLET | Freq: Every day | ORAL | Status: DC

## 2011-12-08 MED ORDER — ACETAMINOPHEN 325 MG PO TABS: 650.0000 mg | ORAL_TABLET | ORAL | Status: DC | PRN

## 2011-12-08 MED ORDER — POTASSIUM CHLORIDE CRYS ER 20 MEQ PO TBCR
40.0000 meq | EXTENDED_RELEASE_TABLET | Freq: Two times a day (BID) | ORAL | Status: DC
  Administered 2011-12-08 – 2011-12-09 (×3): 40 meq via ORAL
  Filled 2011-12-08 (×4): qty 2

## 2011-12-08 MED ORDER — SODIUM CHLORIDE 0.9 % IV SOLN: INTRAVENOUS | Status: DC

## 2011-12-08 MED ORDER — SODIUM CHLORIDE 0.9 % IJ SOLN
3.0000 mL | Freq: Two times a day (BID) | INTRAMUSCULAR | Status: DC
Start: 2011-12-08 — End: 2011-12-08

## 2011-12-08 MED ORDER — RANOLAZINE ER 500 MG PO TB12
500.0000 mg | ORAL_TABLET | Freq: Two times a day (BID) | ORAL | Status: DC
  Administered 2011-12-08 – 2011-12-10 (×4): 500 mg via ORAL
  Filled 2011-12-08 (×5): qty 1

## 2011-12-08 MED ORDER — ONDANSETRON HCL 4 MG/2ML IJ SOLN: 4.0000 mg | Freq: Four times a day (QID) | INTRAMUSCULAR | Status: DC | PRN

## 2011-12-08 MED ORDER — ATROPINE SULFATE 1 MG/ML IJ SOLN
INTRAMUSCULAR | Status: AC
  Filled 2011-12-08: qty 1

## 2011-12-08 MED ORDER — BIVALIRUDIN 250 MG IV SOLR
INTRAVENOUS | Status: AC
  Filled 2011-12-08: qty 250

## 2011-12-08 MED ORDER — SODIUM CHLORIDE 0.9 % IV SOLN
250.0000 mL | INTRAVENOUS | Status: DC
Start: 2011-12-08 — End: 2011-12-08

## 2011-12-08 MED ORDER — LIDOCAINE HCL (PF) 1 % IJ SOLN
INTRAMUSCULAR | Status: AC
  Filled 2011-12-08: qty 30

## 2011-12-08 MED ORDER — HEPARIN (PORCINE) IN NACL 2-0.9 UNIT/ML-% IJ SOLN
INTRAMUSCULAR | Status: AC
  Filled 2011-12-08: qty 1000

## 2011-12-08 MED ORDER — HYDRALAZINE HCL 20 MG/ML IJ SOLN: 10.0000 mg | Freq: Once | INTRAMUSCULAR | Status: DC

## 2011-12-08 MED ORDER — NITROGLYCERIN 0.2 MG/ML ON CALL CATH LAB
INTRAVENOUS | Status: AC
  Filled 2011-12-08: qty 1

## 2011-12-08 MED ORDER — MIDAZOLAM HCL 2 MG/2ML IJ SOLN
INTRAMUSCULAR | Status: AC
  Filled 2011-12-08: qty 2

## 2011-12-08 NOTE — Progress Notes (Signed)
Pt's rt. groin still bruised and swollen. Pressure held for 30 mins. Site is soft and tender. Site also auscultated to chest for bruit. Swishing sound heard over the Rt. Femoral artery. 2 more nurses asked to auscultate the site and also confirmed  they heard bruit. Wilburt Finlay ,PA on call notified  and ordered to check for H and H. Ice pack place over the area with instruction to keep the rt leg still. Pt VS has remained stable. No complaints of pain. Will continue to frequently monitor patient. Pt instructed to call if she feel any different and if she starts to have back pain.Chrisandra Wiemers [place]

## 2011-12-08 NOTE — Care Management Note (Signed)
    Page 1 of 1   12/08/2011     12:10:35 PM   CARE MANAGEMENT NOTE 12/08/2011  Patient:  Julia [name], Julia [name]   Account Number:  [id number]  Date Initiated:  12/08/2011  Documentation initiated by:  Junius Creamer  Subjective/Objective Assessment:   adm  w angina     Action/Plan:   lives w fam, pcp dr Jacky Kindle   Anticipated DC Date:     Anticipated DC Plan:        DC Planning Services  CM consult      Choice offered to / List presented to:             Status of service:   Medicare Important Message given?   (If response is "NO", the following Medicare IM given date fields will be blank) Date Medicare IM given:   Date Additional Medicare IM given:    Discharge Disposition:    Per UR Regulation:  Reviewed for med. necessity/level of care/duration of stay  If discussed at Long Length of Stay Meetings, dates discussed:    Comments:  10/25 12n debbie Shaymus Eveleth rn,bsn [phone] gave pt brilinta card for 30days free and copay assist that may help w meds.

## 2011-12-08 NOTE — Progress Notes (Signed)
Right groin sheath removed with BP_146/55 tolerated well. Below the site noted to get puffy while manual pressure is applied.  Site pressed by 2 staff for 20 min. Puffiness subsided and soft to touch. Pressure dressing applied and instructed to call for any sign of bleeding and bedrest emphasized. Continue to monitor. Latest BP 130/55.

## 2011-12-08 NOTE — CV Procedure (Signed)
Attempted PCI to chronically occluded severely calcified LAD  Tonna Corner, 78 y.o., female  Full note dictated.  DICTATION # [id number], [id number]  Unsuccessful attempt at PCI due to never able to cross calcified chronic occlusion. 3.0 XB LAD guide, multiple wires including PT2 moderate support, Fielder XT, Whisper, and [place] all 300 cm with 1.25x10 Sprinter balloon.  Mid distal LAD collateralized via septals with TIMI 3 flow.  Pt tolerated well with stable hemodynamics and no evidence for dissection or perforation.   REC:  Increased medical therapy with beta blocker, nitrates, ranexa, and possibly amlodipine.  Consider elective CABG if fails medical therapy.  [name], MD, [facility] 12/08/2011 4:50 PM

## 2011-12-08 NOTE — Progress Notes (Signed)
Back from the cath lab sleepy, right groin sheath intact. Bedrest emphasized and not to bend right knee.

## 2011-12-08 NOTE — Progress Notes (Signed)
Courtesy visit

## 2011-12-08 NOTE — Progress Notes (Signed)
To cath lab by bed stable. 

## 2011-12-08 NOTE — Progress Notes (Signed)
Subjective: Mild chest tightness last pm, no complaints currently  Objective: Vital signs in last 24 hours: Temp:  [97.8 F (36.6 C)-98.4 F (36.9 C)] 98.4 F (36.9 C) (10/25 0352) Pulse Rate:  [49-69] 60  (10/25 0352) Resp:  [15-25] 18  (10/25 0352) BP: (125-163)/(40-63) 147/61 mmHg (10/25 0352) SpO2:  [97 %-99 %] 98 % (10/25 0352) Weight:  [38.8 kg (85 lb 8.6 oz)] 38.8 kg (85 lb 8.6 oz) (10/25 0500) Weight change: -1.116 kg (-2 lb 7.4 oz)   Intake/Output from previous day:+195 10/24 0701 - 10/25 0700 In: 806.3 [I.V.:806.3] Out: 651 [Urine:651] Intake/Output this shift:    PE: General:alert and oriented, mild chest tightness last pm Heart:S1S2 RRR Lungs:clear  ZOX:WRUE non tender Ext:no edema, rt groin without hematoma    Lab Results:  Basename 12/08/11 0458 12/07/11 0800  WBC 7.7 6.8  HGB 12.7 12.3  HCT 35.7* 35.4*  PLT 199 186   BMET  Basename 12/07/11 0314 12/06/11 2202  NA 137 140  K 3.1* 3.0*  CL 100 103  CO2 29 28  GLUCOSE 68* 76  BUN 13 13  CREATININE 0.65 0.68  CALCIUM 9.4 9.9    Basename 12/07/11 0800 12/07/11 0313  TROPONINI <0.30 <0.30    Lab Results  Component Value Date   CHOL 207* 12/07/2011   HDL 37* 12/07/2011   LDLCALC 115* 12/07/2011   TRIG 276* 12/07/2011   CHOLHDL 5.6 12/07/2011   Lab Results  Component Value Date   HGBA1C 7.0* 12/06/2011     Lab Results  Component Value Date   TSH 1.764 12/06/2011    Hepatic Function Panel  Basename 12/06/11 2202  PROT 6.1  ALBUMIN 3.2*  AST 19  ALT 14  ALKPHOS 58  BILITOT 0.9  BILIDIR --  IBILI --    Basename 12/07/11 0314  CHOL 207*   No results found for this basename: PROTIME in the last 72 hours    EKG: Orders placed during the hospital encounter of 12/06/11  . EKG 12-LEAD  . EKG 12-LEAD  . EKG 12-LEAD    Studies/Results: Cardiac cath: POST-OPERATIVE DIAGNOSIS:  Severe single vessel CAD with what appears to be sub-acute subtotal occlusion of the mid LAD  after the take-off of D1 and SP2 in a calcified segment of vessel.  Moderate mid RCA stenosis  Preserved LVEF with normal LVEDP with no WMA noted on LV Gram or Echocardiogram  With normal LAD territory wall motion, there is reason to presume that this distribution is viable and is therefore the most obvious "culprit lesion"    Medications: I have reviewed the patient's current medications.    Marland Kitchen aspirin EC  81 mg Oral Daily  . atorvastatin  80 mg Oral q1800  . bivalirudin      . fentaNYL      . heparin      . insulin aspart  0-9 Units Subcutaneous TID WC  . lidocaine      . metoprolol tartrate  25 mg Oral BID  . midazolam      . nitroGLYCERIN      . omega-3 acid ethyl esters  1 g Oral BID  . pantoprazole  40 mg Oral Q1200  . pantoprazole  40 mg Oral Daily  . potassium chloride  40 mEq Oral Once  . sodium chloride  3 mL Intravenous Q12H  . Ticagrelor  180 mg Oral Once  . Ticagrelor  90 mg Oral BID  . DISCONTD: aspirin  81 mg Oral Daily  .  DISCONTD: atorvastatin  10 mg Oral q1800  . DISCONTD: insulin aspart  0-9 Units Subcutaneous Q4H  . DISCONTD: sodium chloride  3 mL Intravenous Q12H   Assessment/Plan: Principal Problem:  *Unstable angina Active Problems:  HTN (hypertension)  CKD (chronic kidney disease) stage 2, GFR 60-89 ml/min  Edema leg  Hyperlipidemia  CAD (coronary artery disease), severe single vessel LAD disease, nl. EF  Hypokalemia  PLAN: for PCI today with Dr. Tresa Endo of LAD.   Recheck labs and place cath orders.  LOS: 2 days   INGOLD,LAURA R 12/08/2011, 8:04 AM   Patient seen and examined. Agree with assessment and plan. I have reviewed angios. I am concerned that this is a chronic calcified occlusion. Discussed risk of vessel perforation. With significant calcification may need HSRA if able to cross lesion. I expressed risk concerns with [patient and family. Discussed CABG options. Pt would like to proceed with attempt at percutaneous  intervention.   [name], MD, [facility] 12/08/2011 2:18 PM

## 2011-12-09 LAB — BASIC METABOLIC PANEL
CO2: 19 mEq/L (ref 19–32)
Calcium: 8.2 mg/dL — ABNORMAL LOW (ref 8.4–10.5)
Creatinine, Ser: 0.7 mg/dL (ref 0.50–1.10)
GFR calc Af Amer: >90 mL/min (ref >90)
GFR calc non Af Amer: 79 mL/min — ABNORMAL LOW (ref >90)
Sodium: 137 mEq/L (ref 135–145)

## 2011-12-09 LAB — CBC
MCH: 28.8 pg (ref 26.0–34.0)
MCHC: 34.2 g/dL (ref 30.0–36.0)
Platelets: 196 10*3/uL (ref 150–400)
RDW: 12.5 % (ref 11.5–15.5)

## 2011-12-09 LAB — GLUCOSE, CAPILLARY
Glucose-Capillary: 147 mg/dL — ABNORMAL HIGH (ref 70–99)
Glucose-Capillary: 142 mg/dL — ABNORMAL HIGH (ref 70–99)

## 2011-12-09 MED ORDER — SODIUM CHLORIDE 0.9 % IJ SOLN
3.0000 mL | Freq: Two times a day (BID) | INTRAMUSCULAR | Status: DC
  Administered 2011-12-09 – 2011-12-10 (×3): 3 mL via INTRAVENOUS

## 2011-12-09 MED ORDER — TRAMADOL HCL 50 MG PO TABS: 50.0000 mg | ORAL_TABLET | Freq: Four times a day (QID) | ORAL | Status: DC | PRN

## 2011-12-09 MED ORDER — METOPROLOL TARTRATE 25 MG PO TABS
37.5000 mg | ORAL_TABLET | Freq: Two times a day (BID) | ORAL | Status: DC
  Administered 2011-12-09 – 2011-12-10 (×2): 37.5 mg via ORAL
  Filled 2011-12-09 (×3): qty 1

## 2011-12-09 NOTE — Cardiovascular Report (Signed)
Julia [name], Julia [name]              ACCOUNT NO.:  [id number]  MEDICAL RECORD NO.:  [id number]  LOCATION:  2916                         FACILITY:  [facility]  PHYSICIAN:  Nicki Guadalajara, M.D.     DATE OF BIRTH:  03-19-1927  DATE OF PROCEDURE:  12/08/2011 DATE OF DISCHARGE:                           CARDIAC CATHETERIZATION   PROCEDURE:  Attempted percutaneous coronary intervention of the left anterior descending.  INDICATIONS:  Ms. Bona Ravi is an 78 year old female who has a history of hypertension, diabetes mellitus, GERD, mild renal insufficiency, and remote stroke.  The patient had experienced 2 weeks of increasing shortness of breath, chest and neck pressure.  She had been seen recently in the office and was felt to have unstable angina and needing hospitalization.  Yesterday, she underwent cardiac catheterization by Dr. Herbie Baltimore, which revealed a probable chronic total occlusion of the LAD and a severely calcified segment after a diagonal vessel with TIMI II flow distally, in addition to 60% RCA stenosis, 80% PDA stenosis.  Dr. Herbie Baltimore was considering intervening on the patient when the patient arrived with possible __________.  Consequently, I was asked that I evaluate the patient for consideration of possible attempt at opening up the chronic occlusion with need for probable rotational atherectomy.  I did review the angiographic findings.  This did suggest probable chronic occlusion of the LAD after diagonal vessel.  This segment was severely calcified.  There did not appear to be any antegrade flow through the stenosis and occlusion, but there were septal collaterals, which seemed to supply the mid LAD and beyond.  The risks and benefits of the procedure were discussed in detail with the patient and family including potential risk for vessel perforation, inability to cross the lesion, and potential risk for need for emergent bypass surgery.  The patient understood the risks,  benefits, and wished to proceed with an attempt at trying to open her LAD percutaneously.  PROCEDURE:  After premedication with Versed 1 mg plus fentanyl 25 mcg, the patient was prepped and draped in usual fashion.  Her right femoral artery was punctured anteriorly and a 7-French sheath was placed in the event high-speed rotational atherectomy was to be performed.  The patient had already been pre-loaded with Brilinta by Dr. Herbie Baltimore. Angiomax bolus plus infusion was administered and ACT was documented to be therapeutic.  A 6-French FL 3 XB LAD guide was used.  Initially, a 300 cm Choice PT2 moderate support was advanced and was able to enter into this probably chronically occluded LAD segment after the diagonal vessel.  However, multiple attempts at crossing this area were unsuccessful.  A 1.25-x10 mm over-the-wire sprinter balloon was then used for wire support.  Despite the balloon, the wire was unable to cross the occlusion.  This wire was then exchanged for a Fielder XT 300 wire.  Again multiple attempts were made to cross the occlusion with the [facility] wire.  Each time it seemed as if the wire was abutting against a brick wall.  Despite low-level balloon dilatation for additional support.  This was not successful in crossing the occlusion.  This wire was then removed and exchanged for long 300 cm Whisper wire.  Again multiple  attempts were made to cross the occlusion unsuccessfully. Finally a Mailman wire was inserted.  Again despite attempts at crossing the calcified chronic total occlusion, this was unsuccessful.  At this point, it was felt that since there was fairly improved TIMI-III flow down the mid LAD through septal collaterals that the best approach would be to abort further attempt.  The vessel was very stable.  There was no evidence for any dissection or perforation.  I explained this to the patient in detail.  Angiomax was discontinued.  During the procedure, she did  receive IC nitroglycerin.  She left the catheterization laboratory with stable hemodynamics.  HEMODYNAMIC DATA:  Central aortic pressure was 132/50.  ANGIOGRAPHIC DATA:  Left main coronary artery was angiographically normal and bifurcated into an LAD and left circumflex system.  The LAD after septal perforating artery and after a diagonal vessel was totally occluded.  There was approximately an inch gap where there was no antegrade flow and a very calcified segment representing the chronic total occlusion.  However, there were septal collaterals, which seemed to fill the mid distal LAD and flow to this vessel today seemed improved and was TIMI-III.  After multiple attempts at trying to cross the chronic total occlusion, these were all unsuccessful and consequently a decision was made to discontinue the procedure since the patient was stable and there was no evidence for any dissection or vessel perforation.  I did discuss this with the family in detail.  The patient will be aggressively treated with medical therapy and if continued recurrent symptomatology develops, she may be a candidate for elective CABG revascularization surgery.          ______________________________ Nicki Guadalajara, M.D.     TK/MEDQ  D:  12/08/2011  T:  12/09/2011  Job:  [id number]  cc:   Nanetta Batty, M.D. Landry Corporal, MD

## 2011-12-09 NOTE — Progress Notes (Signed)
CARDIAC REHAB PHASE I   PRE:  Rate/Rhythm: 64 sinus rhythm  BP:  Supine:   Sitting: 128/41  Standing:    SaO2: 98% RA  MODE:  Ambulation: 300 ft   POST:  Rate/Rhythem: 62 sinus rhythm  BP:  Supine:   Sitting: 138/51  Standing:    SaO2: 96% RA  Pt ambulated in hallway x1 assist with rolling walker.  Steady gait, slight pull to left. Pt education completed.  Understanding verbalized.  Pt oriented to outpatient cardiac rehab.  At pt request, referral will be made to [facility].    [place], [place]

## 2011-12-09 NOTE — Progress Notes (Signed)
[facility] Progress Note  Subjective:  No chest pain. No SOB.  Objective:   Vital Signs in the last 24 hours: Temp:  [97.2 F (36.2 C)-98.3 F (36.8 C)] 97.9 F (36.6 C) (10/26 0730) Pulse Rate:  [44-66] 66  (10/26 0956) Resp:  [12-16] 16  (10/26 0730) BP: (85-175)/(33-58) 118/40 mmHg (10/26 0800) SpO2:  [97 %-100 %] 98 % (10/26 0800)  Intake/Output from previous day: 10/25 0701 - 10/26 0700 In: 1977 [I.V.:1977] Out: 300 [Urine:300]  Scheduled:   . aspirin EC  81 mg Oral Daily  . atorvastatin  80 mg Oral q1800  . bivalirudin      . diazepam  2 mg Oral On Call  . fentaNYL      . heparin      . hydrALAZINE  10 mg Intravenous Once  . insulin aspart  0-9 Units Subcutaneous TID WC  . isosorbide mononitrate  30 mg Oral Daily  . lidocaine      . metoprolol tartrate  25 mg Oral BID  . midazolam      . nitroGLYCERIN      . omega-3 acid ethyl esters  1 g Oral BID  . pantoprazole  40 mg Oral Q1200  . potassium chloride  40 mEq Oral BID  . ranolazine  500 mg Oral BID  . sodium chloride  3 mL Intravenous Q12H  . DISCONTD: aspirin EC  81 mg Oral Daily  . DISCONTD: sodium chloride  3 mL Intravenous Q12H  . DISCONTD: sodium chloride  3 mL Intravenous Q12H  . DISCONTD: Ticagrelor  90 mg Oral BID    Physical Exam:   General appearance: alert, cooperative and no distress Neck: no adenopathy, no carotid bruit, no JVD and supple, symmetrical, trachea midline Lungs: clear to auscultation bilaterally Heart: S1, S2 normal and 1/6 sem Abdomen: soft, non-tender; bowel sounds normal; no masses,  no organomegaly Extremities: no edema, redness or tenderness in the calves or thighs ecchymosis R groin, distal pulses 2+.   Rate: 67  Rhythm: normal sinus rhythm  Lab Results: CBC    Component Value Date/Time   WBC 6.8 12/09/2011 0450   RBC 3.09* 12/09/2011 0450   HGB 8.9* 12/09/2011 0450   HCT 26.0* 12/09/2011 0450   PLT 196 12/09/2011 0450   MCV  84.1 12/09/2011 0450   MCH 28.8 12/09/2011 0450   MCHC 34.2 12/09/2011 0450   RDW 12.5 12/09/2011 0450   LYMPHSABS 1.4 12/06/2011 2202   MONOABS 0.5 12/06/2011 2202   EOSABS 0.2 12/06/2011 2202   BASOSABS 0.0 12/06/2011 2202      Basename 12/09/11 0450 12/08/11 0931  NA 137 134*  K 4.8 3.5  CL 111 98  CO2 19 22  GLUCOSE 91 112*  BUN 10 10  CREATININE 0.70 0.57    Basename 12/07/11 0800 12/07/11 0313  TROPONINI <0.30 <0.30   Hepatic Function Panel  Basename 12/06/11 2202  PROT 6.1  ALBUMIN 3.2*  AST 19  ALT 14  ALKPHOS 58  BILITOT 0.9  BILIDIR --  IBILI --    Basename 12/07/11 0314  INR 1.06    Lipid Panel     Component Value Date/Time   CHOL 207* 12/07/2011 0314   TRIG 276* 12/07/2011 0314   HDL 37* 12/07/2011 0314   CHOLHDL 5.6 12/07/2011 0314   VLDL 55* 12/07/2011 0314   LDLCALC 115* 12/07/2011 0314     Imaging:  No results found.    Assessment/Plan:   Principal  Problem:  *Unstable angina Active Problems:  HTN (hypertension)  CKD (chronic kidney disease) stage 2, GFR 60-89 ml/min  Edema leg  Hyperlipidemia  CAD (coronary artery disease), severe single vessel LAD disease, nl. EF  Hypokalemia  No chest pain. No SOB. Improved flow to mid-distal LAD post more proximal chronic occlusion from septal collaterals. Decreased HB today; s/p angiomax. Ecchymosis right groin.  No active bleeding. Will check stool, Fe studies.  Will increase lopressor to 37.5 mg bid. Transfer to telemetry. Possible home tomorrow.    [name], MD, [facility] 12/09/2011, 10:26 AM

## 2011-12-09 NOTE — Progress Notes (Signed)
12/09/11 0800  Vitals  Pulse Rate 62   Pulse Rate Source Monitor;Apical;Radial  ECG Heart Rate 62   Cardiac Rhythm NSR;Heart block  Heart Block Type 1st degree AVB  BP ! 118/40 mmHg  MAP (mmHg) 59   BP Location Left arm  BP Method Automatic  Patient Position, if appropriate Lying  Oxygen Therapy  SpO2 98 %  O2 Device None (Room air)   Dr.Harding notified of 2 point decrease in hemoglobin since last night. Right femoral site level 1, soft with bruising noted.  Orders received.  Will continue to monitor patient closely.

## 2011-12-09 NOTE — Progress Notes (Deleted)
Pt's rt. groin still bruised and swollen. Pressure held for 30 mins. Site is soft and tender. Site also auscultated to check for bruit. Swishing sound heard over the Rt. Femoral artery. 2 more nurses asked to auscultate the site and also confirmed  they heard bruit. Wilburt Finlay ,PA on call notified  and ordered to check for H and H. Ice pack place over the area with instruction to keep the rt leg still. Pt VS has remained stable. No complaints of pain. Will continue to frequently monitor patient. Pt instructed to call if she feel any different and if she starts to have back pain.Trysta Showman [place]

## 2011-12-10 LAB — CBC
Hemoglobin: 8.3 g/dL — ABNORMAL LOW (ref 12.0–15.0)
MCH: 29.1 pg (ref 26.0–34.0)
MCHC: 34.6 g/dL (ref 30.0–36.0)
RDW: 12.5 % (ref 11.5–15.5)

## 2011-12-10 LAB — FERRITIN: Ferritin: 68 ng/mL (ref 10–291)

## 2011-12-10 LAB — GLUCOSE, CAPILLARY

## 2011-12-10 MED ORDER — METOPROLOL TARTRATE 12.5 MG HALF TABLET: 37.5000 mg | ORAL_TABLET | Freq: Two times a day (BID) | ORAL | Status: DC

## 2011-12-10 MED ORDER — OMEGA-3-ACID ETHYL ESTERS 1 G PO CAPS: 1.0000 g | ORAL_CAPSULE | Freq: Two times a day (BID) | ORAL | Status: DC

## 2011-12-10 MED ORDER — ATORVASTATIN CALCIUM 80 MG PO TABS: 80.0000 mg | ORAL_TABLET | Freq: Every day | ORAL | Status: DC

## 2011-12-10 MED ORDER — FERROUS SULFATE 325 (65 FE) MG PO TABS
325.0000 mg | ORAL_TABLET | Freq: Three times a day (TID) | ORAL | Status: DC
  Filled 2011-12-10 (×2): qty 1

## 2011-12-10 MED ORDER — NITROGLYCERIN 0.4 MG SL SUBL: 0.4000 mg | SUBLINGUAL_TABLET | SUBLINGUAL | Status: DC | PRN

## 2011-12-10 MED ORDER — FERROUS SULFATE 325 (65 FE) MG PO TABS: 325.0000 mg | ORAL_TABLET | Freq: Three times a day (TID) | ORAL | Status: DC

## 2011-12-10 MED ORDER — ISOSORBIDE MONONITRATE ER 30 MG PO TB24: 30.0000 mg | ORAL_TABLET | Freq: Every day | ORAL | Status: DC

## 2011-12-10 MED ORDER — RANOLAZINE ER 500 MG PO TB12: 500.0000 mg | ORAL_TABLET | Freq: Two times a day (BID) | ORAL | Status: DC

## 2011-12-10 NOTE — Progress Notes (Signed)
[facility]  Subjective: Feeling better.  Objective: Vital signs in last 24 hours: Temp:  [97.5 F (36.4 C)-98.3 F (36.8 C)] 98.3 F (36.8 C) (10/27 0500) Pulse Rate:  [61-71] 67  (10/27 1003) Resp:  [17-18] 18  (10/27 0500) BP: (111-138)/(41-57) 129/41 mmHg (10/27 1003) SpO2:  [95 %-97 %] 95 % (10/27 0500) Last BM Date: 12/08/11  Intake/Output from previous day: 10/26 0701 - 10/27 0700 In: 600 [P.O.:600] Out: 300 [Urine:300] Intake/Output this shift: Total I/O In: 243 [P.O.:240; I.V.:3] Out: -   Medications Current Facility-Administered Medications  Medication Dose Route Frequency Provider Last Rate Last Dose  . acetaminophen (TYLENOL) tablet 650 mg  650 mg Oral Q4H PRN [name], MD      . ALPRAZolam Prudy Feeler) tablet 0.25 mg  0.25 mg Oral BID PRN Nada Boozer, NP      . aspirin EC tablet 81 mg  81 mg Oral Daily Nada Boozer, NP   81 mg at 12/10/11 1001  . atorvastatin (LIPITOR) tablet 80 mg  80 mg Oral q1800 Marykay Lex, MD   80 mg at 12/09/11 1727  . diphenhydrAMINE (BENADRYL) capsule 25 mg  25 mg Oral QHS PRN Abelino Derrick, PA      . hydrALAZINE (APRESOLINE) injection 10 mg  10 mg Intravenous Once Wilburt Finlay, PA      . insulin aspart (novoLOG) injection 0-9 Units  0-9 Units Subcutaneous TID WC Nada Boozer, NP   2 Units at 12/10/11 0758  . isosorbide mononitrate (IMDUR) 24 hr tablet 30 mg  30 mg Oral Daily [name], MD   30 mg at 12/10/11 1003  . metoprolol tartrate (LOPRESSOR) tablet 37.5 mg  37.5 mg Oral BID [name], MD   37.5 mg at 12/10/11 1003  . nitroGLYCERIN (NITROSTAT) SL tablet 0.4 mg  0.4 mg Sublingual Q5 Min x 3 PRN Nada Boozer, NP      . omega-3 acid ethyl esters (LOVAZA) capsule 1 g  1 g Oral BID Nada Boozer, NP   1 g at 12/10/11 1001  . ondansetron (ZOFRAN) injection 4 mg  4 mg Intravenous Q6H PRN Nada Boozer, NP      . ondansetron [facility]) injection 4 mg  4 mg Intravenous Q6H PRN Marykay Lex, MD       . pantoprazole (PROTONIX) EC tablet 40 mg  40 mg Oral Q1200 Nada Boozer, NP   40 mg at 12/09/11 1300  . ranolazine (RANEXA) 12 hr tablet 500 mg  500 mg Oral BID [name], MD   500 mg at 12/10/11 1003  . sodium chloride 0.9 % injection 3 mL  3 mL Intravenous Q12H [name], MD   3 mL at 12/10/11 1002  . traMADol (ULTRAM) tablet 50 mg  50 mg Oral Q6H PRN Abelino Derrick, PA      . zolpidem (AMBIEN) tablet 5 mg  5 mg Oral QHS PRN Abelino Derrick, PA        PE: General appearance: alert, cooperative and no distress Lungs: clear to auscultation bilaterally Heart: regular rate and rhythm, S1, S2 normal, no murmur, click, rub or gallop Extremities: No LEE Pulses: 2+ and symmetric Skin: Moderate hematoma in the right groin.  Large area of ecchymosis.  MIld tender.  Neurologic: Grossly normal  Lab Results:   Basename 12/10/11 0530 12/09/11 0450 12/08/11 2217 12/08/11 0931  WBC 5.9 6.8 -- 8.4  HGB 8.3* 8.9* 10.9* --  HCT  24.0* 26.0* 31.1* --  PLT 162 196 -- 209   BMET  Basename 12/09/11 0450 12/08/11 0931  NA 137 134*  K 4.8 3.5  CL 111 98  CO2 19 22  GLUCOSE 91 112*  BUN 10 10  CREATININE 0.70 0.57  CALCIUM 8.2* 9.3    Assessment/Plan  Principal Problem:  *Unstable angina Active Problems:  HTN (hypertension)  CKD (chronic kidney disease) stage 2, GFR 60-89 ml/min  Edema leg  Hyperlipidemia  CAD (coronary artery disease), severe single vessel LAD disease, nl. EF  Hypokalemia  Plan:   1.  Hgb 8.3 down from 10.9 on 10/25.  The patient had a BM this AM but it was not heme checked.  2.  BP and HR stable. 3.  S/P LHC with unsuccessful attempt at PCI due to not being able to cross calcified chronic occlusion.  Groin hematoma.  Appears stable.  Mildly tender. 4.  Disposition.  Monitor Hgb as OPt.   LOS: 4 days    HAGER, BRYAN 12/10/2011 11:25 AM  I have seen & examined the patient along with Mr. Leron Croak.  I agree with his findings, exam & recommendations.  She  seems to be relatively stable with no further anginal symptoms.  Her R groin hematoma is large, but soft & only mildly tender with a soft bruit.   H/H drifting down but hemodynamically stable, not tachycardic & no dyspnea.  She is hoping to go home today.  We discussed the concerns about her anemia, but that with her stability does not warrant transfusion.  Will start Iron supplementation.  We compromised on our plan -- if she is able to ambulate in the hall without difficulty -- no angina, dyspnea or leg pain, she can be discharged with OP H/H check early this week along with a R groin arterial doppler US to evaluate for possible pseudoaneurysm.  She will be discharged on Imdur, Ranexa & BB for anginal relief.  ROV by early next week unless Vascular US or H/H check grossly abnormal.  Marykay Lex, M.D., M.S. [facility] [address] [place], [place]  [address]  [phone] Pager # [phone] 12/10/2011 12:01 PM

## 2011-12-11 MED FILL — Dextrose Inj 5%: INTRAVENOUS | Qty: 50 | Status: AC

## 2011-12-11 NOTE — Discharge Summary (Signed)
Physician Discharge Summary  Patient ID: Julia [name] MRN: [id number] DOB/AGE: 17-Oct-1927 78 y.o.  Admit date: 12/06/2011 Discharge date: 12/11/2011  Admission Diagnoses:  Unstable angina  Discharge Diagnoses:  Principal Problem:  *Unstable angina Active Problems:  HTN (hypertension)  CKD (chronic kidney disease) stage 2, GFR 60-89 ml/min  Edema leg  Hyperlipidemia  CAD (coronary artery disease), severe single vessel LAD disease, nl. EF  Hypokalemia   Discharged Condition: stable  Hospital Course:   78 yo, [name] with 2 week episodes of chest and neck pressure associated with DOE. On Sunday several episodes while walking in [employer], Significantly SOB, but with rest would resolve. But every time she exerted the DOE would return along with neck and chest pressure. Another episode occurred yesterday. In the office with minimal activity she is symptom free.   She was seen by Dr. Allyson Sabal in the office and felt this was significant unstable angina and needed hospitalization. She did have an echo here In the office with normal EF and essentially normal Echo.  Left heart cath was completed and an unsuccessful attempt at PCI. Unable to cross calcified chronic occlusion.   Patient developed a moderate sized right groin hematoma.  HGB decreased to  8.3 but was relatively stable compared to previous day -- hemodynamically stable.   Lopressor was increased to 37.5mg , BID The patient ambulated through the unit prior to discharge without difficulty.  She was discharged in stable condition after being seen by Dr. Herbie Baltimore.  Right groin ultrasound ordered as an OP for 10/29 as well as recheck of HGB.     Consults: None  Significant Diagnostic Studies:  Unsuccessful attempt at PCI due to never able to cross calcified chronic occlusion.  3.0 XB LAD guide, multiple wires including PT2 moderate support, Fielder XT, Whisper, and [place] all 300 cm with 1.25x10 Sprinter balloon. Mid distal LAD collateralized  via septals with TIMI 3 flow.  Pt tolerated well with stable hemodynamics and no evidence for dissection or perforation.  REC: Increased medical therapy with beta blocker, nitrates, ranexa, and possibly amlodipine. Consider elective CABG if fails medical therapy.  [name], MD, [facility]  12/08/2011  Treatments:  Lopressor 37.5mg  bid  Discharge Exam: Blood pressure 106/44, pulse 58, temperature 98.5 F (36.9 C), temperature source Oral, resp. rate 16, height 4\' 8"  (1.422 m), weight 38.8 kg (85 lb 8.6 oz), SpO2 99.00%.   Disposition: 01-Home or Self Care  Discharge Orders    Future Orders Please Complete By Expires   Amb Referral to Cardiac Rehabilitation      Diet - low sodium heart healthy      Increase activity slowly      Discharge instructions      Comments:   No Lifting more than a half gallon of milk for three days.       Medication List     As of 12/11/2011  1:21 PM    STOP taking these medications         propranolol-hydrochlorothiazide 40-25 MG per tablet   Commonly known as: INDERIDE      TAKE these medications         aspirin 81 MG chewable tablet   Chew 81 mg by mouth daily.      atorvastatin 80 MG tablet   Commonly known as: LIPITOR   Take 1 tablet (80 mg total) by mouth daily at 6 PM.      ferrous sulfate 325 (65 FE) MG tablet   Take 1 tablet (325  mg total) by mouth 3 (three) times daily with meals.      fish oil-omega-3 fatty acids 1000 MG capsule   Take 1 g by mouth 2 (two) times daily.      glipiZIDE 10 MG tablet   Commonly known as: GLUCOTROL   Take 10 mg by mouth daily.      isosorbide mononitrate 30 MG 24 hr tablet   Commonly known as: IMDUR   Take 1 tablet (30 mg total) by mouth daily.      metFORMIN 500 MG tablet   Commonly known as: GLUCOPHAGE   Take 500 mg by mouth 2 (two) times daily with a meal.      metoprolol tartrate 12.5 mg Tabs   Commonly known as: LOPRESSOR   Take 1.5 tablets (37.5 mg total) by mouth 2 (two) times  daily.      multivitamin with minerals Tabs   Take 1 tablet by mouth daily.      nitroGLYCERIN 0.4 MG SL tablet   Commonly known as: NITROSTAT   Place 1 tablet (0.4 mg total) under the tongue every 5 (five) minutes x 3 doses as needed for chest pain.      omega-3 acid ethyl esters 1 G capsule   Commonly known as: LOVAZA   Take 1 capsule (1 g total) by mouth 2 (two) times daily.      omeprazole 20 MG capsule   Commonly known as: PRILOSEC   Take 20 mg by mouth daily.      ranolazine 500 MG 12 hr tablet   Commonly known as: RANEXA   Take 1 tablet (500 mg total) by mouth 2 (two) times daily.           Follow-up Information    Follow up with Runell Gess, MD. (Our office will call you with the appointment date and time.)    Contact information:   [address] [place] [address] [phone]         Signed: Wilburt Finlay 12/11/2011, 1:21 PM  I personally saw & examined the patient on the day of discharge.   Admitted with SSx of crescendo angina, found to have 100% mid LAD occlusion.  On staged procedure, unsuccessful attempt to cross the lesion.  Had relatively god homocollaterals.  Plan was to optimize Med Rx.  Ranexa added & BB dosed.  There was difficulty with sheath pull from attempted PCI -- relatively large hematoma with residual ecchymoses.  She did have a drop in H/H but remained hemodynamically stable & did not require transfusion.  She is d/c'd on Iron supplementation with plan for H/H check & groin doppler early this week.  Then ROV with Dr.Berry.  Marykay Lex, M.D., M.S. [facility] [address] [place], [place]  [address]  [phone] Pager # [phone] 12/11/2011 2:16 PM

## 2011-12-12 ENCOUNTER — Other Ambulatory Visit: Payer: Self-pay | Admitting: Thoracic Diseases

## 2011-12-12 ENCOUNTER — Observation Stay (HOSPITAL_COMMUNITY)
Admission: AD | Admit: 2011-12-12 | Discharge: 2011-12-12 | Disposition: A | Discharge status: Home / Self Care | Payer: Medicare Other | Source: Ambulatory Visit | Attending: Vascular Surgery | Admitting: Vascular Surgery

## 2011-12-12 ENCOUNTER — Other Ambulatory Visit: Payer: Self-pay | Admitting: Vascular Surgery

## 2011-12-12 ENCOUNTER — Encounter (HOSPITAL_COMMUNITY): Payer: Self-pay | Admitting: General Practice

## 2011-12-12 DIAGNOSIS — I729 Aneurysm of unspecified site: Secondary | ICD-10-CM

## 2011-12-12 DIAGNOSIS — I724 Aneurysm of artery of lower extremity: Principal | ICD-10-CM | POA: Insufficient documentation

## 2011-12-12 HISTORY — DX: Type 2 diabetes mellitus without complications: E11.9

## 2011-12-12 HISTORY — DX: Shortness of breath: R06.02

## 2011-12-12 HISTORY — PX: PSEUDOANEURYSM REPAIR: SHX2272

## 2011-12-12 MED ORDER — SODIUM CHLORIDE 0.9 % IV SOLN: INTRAVENOUS | Status: DC

## 2011-12-12 NOTE — Progress Notes (Signed)
*  PRELIMINARY RESULTS* Vascular Ultrasound Right common femoral artery pseudoaneurysm compression has been completed.  Appears to be successful compression of pseudoaneurysm.   Farrel Demark, RDMS, RVT 12/12/2011, 7:18 PM

## 2011-12-12 NOTE — Progress Notes (Signed)
Successful compression of right femoral pseudoaneurysm.  Will d/c at 930 pm  Follow up with duplex US in our office this Thursday.  Fabienne Bruns, MD

## 2011-12-12 NOTE — Progress Notes (Signed)
[facility] [place]  HISTORY AND PHYSICAL  History of Present Illness: Patient is a 78 y.o. year old female who presents for evaluation of a right femoral pseudoaneurysm. Pt had cath by Dr Kelly several days ago. Pt with hematoma on discharge. Pt was noted to have right femoral pseudoaneurysm on duplex at Dr Kelly's office today by report. Pt has minimal pain in groin. She has no numbness or tingling in foot. Other medical problems include hypertension, diabetes, angina,CKD 2 all currently stable  Past Medical History   Diagnosis  Date   .  Hypertension    .  Diabetes mellitus without complication    .  Arthritis    .  Stroke    .  GERD (gastroesophageal reflux disease)    .  Chronic kidney disease    .  Unstable angina  12/06/2011   .  CKD (chronic kidney disease) stage 2, GFR 60-89 ml/min  12/06/2011   .  Edema leg  12/06/2011   .  Hyperlipidemia  12/06/2011   .  CAD (coronary artery disease), severe single vessel LAD disease, nl. EF  12/08/2011   PSH  Prior abdominal surgery unknown reason  Social History  History   Substance Use Topics   .  Smoking status:  Never Smoker   .  Smokeless tobacco:  Never Used   .  Alcohol Use:  No   Family History  No family history on file.  Allergies  No Known Allergies  Current Outpatient Prescriptions   Medication  Sig  Dispense  Refill   .  aspirin 81 MG chewable tablet  Chew 81 mg by mouth daily.     .  atorvastatin (LIPITOR) 80 MG tablet  Take 1 tablet (80 mg total) by mouth daily at 6 PM.  30 tablet  5   .  ferrous sulfate 325 (65 FE) MG tablet  Take 1 tablet (325 mg total) by mouth 3 (three) times daily with meals.  90 tablet  5   .  fish oil-omega-3 fatty acids 1000 MG capsule  Take 1 g by mouth 2 (two) times daily.     .  glipiZIDE (GLUCOTROL) 10 MG tablet  Take 10 mg by mouth daily.     .  isosorbide mononitrate (IMDUR) 30 MG 24 hr tablet  Take 1 tablet (30 mg total) by mouth daily.  30 tablet  5   .  metFORMIN  (GLUCOPHAGE) 500 MG tablet  Take 500 mg by mouth 2 (two) times daily with a meal.     .  metoprolol tartrate (LOPRESSOR) 12.5 mg TABS  Take 1.5 tablets (37.5 mg total) by mouth 2 (two) times daily.  90 tablet  5   .  Multiple Vitamin (MULTIVITAMIN WITH MINERALS) TABS  Take 1 tablet by mouth daily.     .  nitroGLYCERIN (NITROSTAT) 0.4 MG SL tablet  Place 1 tablet (0.4 mg total) under the tongue every 5 (five) minutes x 3 doses as needed for chest pain.  25 tablet  5   .  omega-3 acid ethyl esters (LOVAZA) 1 G capsule  Take 1 capsule (1 g total) by mouth 2 (two) times daily.  60 capsule  5   .  omeprazole (PRILOSEC) 20 MG capsule  Take 20 mg by mouth daily.     .  ranolazine (RANEXA) 500 MG 12 hr tablet  Take 1 tablet (500 mg total) by mouth 2 (two) times daily.  60 tablet  5     ROS:  General: No weight loss, Fever, chills  HEENT: No recent headaches, no nasal bleeding, no visual changes, no sore throat  Neurologic: No dizziness, blackouts, seizures. No recent symptoms of stroke or mini- stroke. No recent episodes of slurred speech, or temporary blindness.  Cardiac: + shortness of breath with exertion.  Vascular: No history of rest pain in feet. No history of claudication. No history of non-healing ulcer, No history of DVT  Pulmonary: No home oxygen, no productive cough, no hemoptysis, No asthma or wheezing  Musculoskeletal: [ ] Arthritis, [ ] Low back pain, [ ] Joint pain  Hematologic:No history of hypercoagulable state. No history of easy bleeding. No history of anemia  Gastrointestinal: No hematochezia or melena, No gastroesophageal reflux, no trouble swallowing  Urinary: [ x] chronic Kidney disease, [ ] on HD - [ ] MWF or [ ] TTHS, [ ] Burning with urination, [ ] Frequent urination, [ ] Difficulty urinating;  Skin: No rashes  Psychological: No history of anxiety, No history of depression  Physical Examination  General: Alert and oriented, no acute distress  HEENT: Normal  Pulmonary: Clear to  auscultation bilaterally  Cardiac: Regular Rate and Rhythm  Abdomen: Soft, non-tender, non-distended, no mass, well healed laparotomy scar  Skin: No rash, ecchymosis right groin thigh 20 cm  Extremity Pulses: 2+ radial, brachial, femoral, absent dorsalis pedis, posterior tibial pulses bilaterally, 4 x 4 cm firm nonpulsatile mass right groin  Musculoskeletal: No deformity or edema  Neurologic: Upper and lower extremity motor 5/5 and symmetric  ASSESSMENT: Pseudoaneurysm right groin no evidence of skin compromise expansion or ischemia  PLAN: Ultrasound guided compression possible thrombin injection today. Keep NPO  Charles Fields, MD  [facility] [place]  Office: [phone]  Pager: [phone]  

## 2011-12-12 NOTE — Addendum Note (Signed)
Addended by: Marlowe Shores on: 12/12/2011 04:19 PM   Modules accepted: Orders

## 2011-12-12 NOTE — H&P (Signed)
[facility] [place]  HISTORY AND PHYSICAL  History of Present Illness: Patient is a 78 y.o. year old female who presents for evaluation of a right femoral pseudoaneurysm. Pt had cath by Dr Tresa Endo several days ago. Pt with hematoma on discharge. Pt was noted to have right femoral pseudoaneurysm on duplex at Dr Landry Dyke office today by report. Pt has minimal pain in groin. She has no numbness or tingling in foot. Other medical problems include hypertension, diabetes, angina,CKD 2 all currently stable  Past Medical History   Diagnosis  Date   .  Hypertension    .  Diabetes mellitus without complication    .  Arthritis    .  Stroke    .  GERD (gastroesophageal reflux disease)    .  Chronic kidney disease    .  Unstable angina  12/06/2011   .  CKD (chronic kidney disease) stage 2, GFR 60-89 ml/min  12/06/2011   .  Edema leg  12/06/2011   .  Hyperlipidemia  12/06/2011   .  CAD (coronary artery disease), severe single vessel LAD disease, nl. EF  12/08/2011   PSH  Prior abdominal surgery unknown reason  Social History  History   Substance Use Topics   .  Smoking status:  Never Smoker   .  Smokeless tobacco:  Never Used   .  Alcohol Use:  No   Family History  No family history on file.  Allergies  No Known Allergies  Current Outpatient Prescriptions   Medication  Sig  Dispense  Refill   .  aspirin 81 MG chewable tablet  Chew 81 mg by mouth daily.     Marland Kitchen  atorvastatin (LIPITOR) 80 MG tablet  Take 1 tablet (80 mg total) by mouth daily at 6 PM.  30 tablet  5   .  ferrous sulfate 325 (65 FE) MG tablet  Take 1 tablet (325 mg total) by mouth 3 (three) times daily with meals.  90 tablet  5   .  fish oil-omega-3 fatty acids 1000 MG capsule  Take 1 g by mouth 2 (two) times daily.     Marland Kitchen  glipiZIDE (GLUCOTROL) 10 MG tablet  Take 10 mg by mouth daily.     .  isosorbide mononitrate (IMDUR) 30 MG 24 hr tablet  Take 1 tablet (30 mg total) by mouth daily.  30 tablet  5   .  metFORMIN  (GLUCOPHAGE) 500 MG tablet  Take 500 mg by mouth 2 (two) times daily with a meal.     .  metoprolol tartrate (LOPRESSOR) 12.5 mg TABS  Take 1.5 tablets (37.5 mg total) by mouth 2 (two) times daily.  90 tablet  5   .  Multiple Vitamin (MULTIVITAMIN WITH MINERALS) TABS  Take 1 tablet by mouth daily.     .  nitroGLYCERIN (NITROSTAT) 0.4 MG SL tablet  Place 1 tablet (0.4 mg total) under the tongue every 5 (five) minutes x 3 doses as needed for chest pain.  25 tablet  5   .  omega-3 acid ethyl esters (LOVAZA) 1 G capsule  Take 1 capsule (1 g total) by mouth 2 (two) times daily.  60 capsule  5   .  omeprazole (PRILOSEC) 20 MG capsule  Take 20 mg by mouth daily.     .  ranolazine (RANEXA) 500 MG 12 hr tablet  Take 1 tablet (500 mg total) by mouth 2 (two) times daily.  60 tablet  5  ROS:  General: No weight loss, Fever, chills  HEENT: No recent headaches, no nasal bleeding, no visual changes, no sore throat  Neurologic: No dizziness, blackouts, seizures. No recent symptoms of stroke or mini- stroke. No recent episodes of slurred speech, or temporary blindness.  Cardiac: + shortness of breath with exertion.  Vascular: No history of rest pain in feet. No history of claudication. No history of non-healing ulcer, No history of DVT  Pulmonary: No home oxygen, no productive cough, no hemoptysis, No asthma or wheezing  Musculoskeletal: [ ]  Arthritis, [ ]  Low back pain, [ ]  Joint pain  Hematologic:No history of hypercoagulable state. No history of easy bleeding. No history of anemia  Gastrointestinal: No hematochezia or melena, No gastroesophageal reflux, no trouble swallowing  Urinary: [ x] chronic Kidney disease, [ ]  on HD - [ ]  MWF or [ ]  TTHS, [ ]  Burning with urination, [ ]  Frequent urination, [ ]  Difficulty urinating;  Skin: No rashes  Psychological: No history of anxiety, No history of depression  Physical Examination  General: Alert and oriented, no acute distress  HEENT: Normal  Pulmonary: Clear to  auscultation bilaterally  Cardiac: Regular Rate and Rhythm  Abdomen: Soft, non-tender, non-distended, no mass, well healed laparotomy scar  Skin: No rash, ecchymosis right groin thigh 20 cm  Extremity Pulses: 2+ radial, brachial, femoral, absent dorsalis pedis, posterior tibial pulses bilaterally, 4 x 4 cm firm nonpulsatile mass right groin  Musculoskeletal: No deformity or edema  Neurologic: Upper and lower extremity motor 5/5 and symmetric  ASSESSMENT: Pseudoaneurysm right groin no evidence of skin compromise expansion or ischemia  PLAN: Ultrasound guided compression possible thrombin injection today. Keep NPO  Fabienne Bruns, MD  [facility] [place]  Office: [phone]  Pager: [phone]

## 2011-12-12 NOTE — Addendum Note (Signed)
Addended by: Marlowe Shores on: 12/12/2011 03:23 PM   Modules accepted: Orders

## 2011-12-12 NOTE — Addendum Note (Signed)
Addended by: Marlowe Shores on: 12/12/2011 04:12 PM   Modules accepted: Orders

## 2011-12-13 ENCOUNTER — Telehealth: Payer: Self-pay | Admitting: Vascular Surgery

## 2011-12-13 ENCOUNTER — Other Ambulatory Visit: Payer: Self-pay | Admitting: *Deleted

## 2011-12-13 DIAGNOSIS — I724 Aneurysm of artery of lower extremity: Secondary | ICD-10-CM

## 2011-12-13 DIAGNOSIS — Z48812 Encounter for surgical aftercare following surgery on the circulatory system: Secondary | ICD-10-CM

## 2011-12-13 NOTE — Discharge Summary (Signed)
[facility] Discharge Summary   Patient ID:  Julia [name] MRN: [id number] DOB/AGE: 76-Aug-1929 76 y.o.  Admit date: 12/12/2011 Discharge date: 12/12/11   Admission Diagnosis: Right femoral pseudoaneurysm  Discharge Diagnoses:  Right femoral pseudoaneurysm  Secondary Diagnoses: Past Medical History  Diagnosis Date  . Hypertension   . GERD (gastroesophageal reflux disease)   . Unstable angina 12/06/2011  . Edema leg 12/06/2011  . Hyperlipidemia 12/06/2011  . CAD (coronary artery disease), severe single vessel LAD disease, nl. EF 12/08/2011  . Shortness of breath     "all the time since last Friday" (12/12/2011)  . Type II diabetes mellitus   . CKD (chronic kidney disease) stage 2, GFR 60-89 ml/min 12/06/2011  . Stroke     "several small strokes"; denies residual  (12/12/2011)  . Arthritis     "all my joints" (12/12/2011)      Discharged Condition: good  HPI:  Patient is a 78 y.o. year old female who presents for evaluation of a right femoral pseudoaneurysm. Pt had cath by Dr Tresa Endo several days ago. Pt with hematoma on discharge. Pt was noted to have right femoral pseudoaneurysm on duplex at Dr Landry Dyke office today by report. Pt has minimal pain in groin. She has no numbness or tingling in foot. Other medical problems include hypertension, diabetes, angina,CKD 2 all currently stable. She is admitted to observation for right fem pseudoaneurysm compression by Vascular Tech.   Hospital Course:  Julia [name] is a 78 y.o. female is S/P Right  fem pseudoaneurysm compression Pt tolerated procedure well. Dr Darrick Penna at bedside. Pt. Ambulating, voiding and taking PO diet without difficulty. Pt pain controlled with PO pain meds. Labs as below Complications:none  Consults:     Significant Diagnostic Studies: CBC Lab Results  Component Value Date   WBC 5.9 12/10/2011   HGB 8.3* 12/10/2011   HCT 24.0* 12/10/2011   MCV 84.2 12/10/2011   PLT 162  12/10/2011    BMET    Component Value Date/Time   NA 137 12/09/2011 0450   K 4.8 12/09/2011 0450   CL 111 12/09/2011 0450   CO2 19 12/09/2011 0450   GLUCOSE 91 12/09/2011 0450   BUN 10 12/09/2011 0450   CREATININE 0.70 12/09/2011 0450   CALCIUM 8.2* 12/09/2011 0450   GFRNONAA 79* 12/09/2011 0450   GFRAA >90 12/09/2011 0450   COAG Lab Results  Component Value Date   INR 1.06 12/07/2011   INR 1.00 12/06/2011     Disposition:  Discharge to :Home Discharge Orders    Future Appointments: Provider: Department: Dept Phone: Center:   12/14/2011 11:30 AM [facility] [phone] [facility]     Future Orders Please Complete By Expires   Resume previous diet      Call MD for:  temperature >100.5      Call MD for:  redness, tenderness, or signs of infection (pain, swelling, bleeding, redness, odor or green/yellow discharge around incision site)      Call MD for:  severe or increased pain, loss or decreased feeling  in affected limb(s)      Increase activity slowly      Comments:   Walk with assistance use walker or cane as needed   May shower       No dressing needed      may wash over wound with mild soap and water         Julia, [name]  Home Medication Instructions ONG:[id number]  Printed on:12/13/11 1506  Medication Information                    aspirin 81 MG chewable tablet Chew 81 mg by mouth daily.           metFORMIN (GLUCOPHAGE) 500 MG tablet Take 500 mg by mouth 2 (two) times daily with a meal.           Multiple Vitamin (MULTIVITAMIN WITH MINERALS) TABS Take 1 tablet by mouth daily.           fish oil-omega-3 fatty acids 1000 MG capsule Take 1 g by mouth 2 (two) times daily.           omeprazole (PRILOSEC) 20 MG capsule Take 20 mg by mouth daily.           glipiZIDE (GLUCOTROL) 10 MG tablet Take 10 mg by mouth daily.           atorvastatin (LIPITOR) 80 MG tablet Take 1 tablet (80 mg total) by mouth daily at 6 PM.           ferrous  sulfate 325 (65 FE) MG tablet Take 1 tablet (325 mg total) by mouth 3 (three) times daily with meals.           isosorbide mononitrate (IMDUR) 30 MG 24 hr tablet Take 1 tablet (30 mg total) by mouth daily.           omega-3 acid ethyl esters (LOVAZA) 1 G capsule Take 1 capsule (1 g total) by mouth 2 (two) times daily.           ranolazine (RANEXA) 500 MG 12 hr tablet Take 1 tablet (500 mg total) by mouth 2 (two) times daily.           metoprolol tartrate (LOPRESSOR) 25 MG tablet Take 37.5 mg by mouth 2 (two) times daily.           nitroGLYCERIN (NITROSTAT) 0.4 MG SL tablet Place 0.4 mg under the tongue every 5 (five) minutes as needed. For chest pain            Verbal and written Discharge instructions given to the patient. Wound care per Discharge AVS Follow-up Information    Follow up with Sherren Kerns, MD. On 12/14/2011. (our office will call with appt)    Contact information:   [address] [place] [address] [phone]          Signed: Marlowe Shores 12/13/2011, 3:06 PM

## 2011-12-13 NOTE — Telephone Encounter (Addendum)
Message copied by Rosalyn Charters on Wed Dec 13, 2011 11:35 AM ------      Message from: Melene Plan      Created: Wed Dec 13, 2011 11:12 AM                   ----- Message -----         From: Sherren Kerns, MD         Sent: 12/12/2011   6:38 PM           To: Melene Plan, RN            After a 5 hr debacle trying to get this pt in EPIC we were able to compress her pseudoaneurysm.  She got here at 1 pm.  We started at 600 pm.  Any other pts like this need to be admitted thru short stay just like the Diatek catheters.  Huge waste of my time Regina's time and the pt's time!  We need to do better than this!            She needs a follow up lab only right femoral ultrasound on Thursday.  Please have the lab call her with appt.            Charles  notified pt. of follow up lab only right femoral ultrasound on thursday 12-14-11 11:30

## 2011-12-14 ENCOUNTER — Ambulatory Visit (INDEPENDENT_AMBULATORY_CARE_PROVIDER_SITE_OTHER): Payer: Medicare Other | Admitting: Vascular Surgery

## 2011-12-14 DIAGNOSIS — IMO0002 Reserved for concepts with insufficient information to code with codable children: Secondary | ICD-10-CM

## 2011-12-14 DIAGNOSIS — I724 Aneurysm of artery of lower extremity: Secondary | ICD-10-CM

## 2011-12-14 DIAGNOSIS — Z48812 Encounter for surgical aftercare following surgery on the circulatory system: Secondary | ICD-10-CM

## 2012-01-27 ENCOUNTER — Emergency Department (HOSPITAL_COMMUNITY): Payer: Medicare Other

## 2012-01-27 ENCOUNTER — Inpatient Hospital Stay (HOSPITAL_COMMUNITY): Payer: Medicare Other

## 2012-01-27 ENCOUNTER — Encounter (HOSPITAL_COMMUNITY): Payer: Self-pay | Admitting: Emergency Medicine

## 2012-01-27 ENCOUNTER — Observation Stay (HOSPITAL_COMMUNITY)
Admission: EM | Admit: 2012-01-27 | Discharge: 2012-01-29 | DRG: 312 | Disposition: A | Discharge status: Home / Self Care | Payer: Medicare Other | Attending: Internal Medicine | Admitting: Internal Medicine

## 2012-01-27 DIAGNOSIS — R0602 Shortness of breath: Secondary | ICD-10-CM | POA: Insufficient documentation

## 2012-01-27 DIAGNOSIS — M542 Cervicalgia: Secondary | ICD-10-CM | POA: Insufficient documentation

## 2012-01-27 DIAGNOSIS — R079 Chest pain, unspecified: Secondary | ICD-10-CM

## 2012-01-27 DIAGNOSIS — I10 Essential (primary) hypertension: Secondary | ICD-10-CM | POA: Diagnosis present

## 2012-01-27 DIAGNOSIS — I129 Hypertensive chronic kidney disease with stage 1 through stage 4 chronic kidney disease, or unspecified chronic kidney disease: Secondary | ICD-10-CM | POA: Insufficient documentation

## 2012-01-27 DIAGNOSIS — R112 Nausea with vomiting, unspecified: Secondary | ICD-10-CM

## 2012-01-27 DIAGNOSIS — B9789 Other viral agents as the cause of diseases classified elsewhere: Secondary | ICD-10-CM | POA: Insufficient documentation

## 2012-01-27 DIAGNOSIS — N182 Chronic kidney disease, stage 2 (mild): Secondary | ICD-10-CM | POA: Insufficient documentation

## 2012-01-27 DIAGNOSIS — I798 Other disorders of arteries, arterioles and capillaries in diseases classified elsewhere: Secondary | ICD-10-CM | POA: Insufficient documentation

## 2012-01-27 DIAGNOSIS — W19XXXA Unspecified fall, initial encounter: Secondary | ICD-10-CM

## 2012-01-27 DIAGNOSIS — I2510 Atherosclerotic heart disease of native coronary artery without angina pectoris: Secondary | ICD-10-CM | POA: Insufficient documentation

## 2012-01-27 DIAGNOSIS — Z7982 Long term (current) use of aspirin: Secondary | ICD-10-CM | POA: Insufficient documentation

## 2012-01-27 DIAGNOSIS — E1129 Type 2 diabetes mellitus with other diabetic kidney complication: Secondary | ICD-10-CM | POA: Insufficient documentation

## 2012-01-27 DIAGNOSIS — R42 Dizziness and giddiness: Secondary | ICD-10-CM

## 2012-01-27 DIAGNOSIS — Z8673 Personal history of transient ischemic attack (TIA), and cerebral infarction without residual deficits: Secondary | ICD-10-CM | POA: Insufficient documentation

## 2012-01-27 DIAGNOSIS — E785 Hyperlipidemia, unspecified: Secondary | ICD-10-CM | POA: Insufficient documentation

## 2012-01-27 DIAGNOSIS — R55 Syncope and collapse: Principal | ICD-10-CM | POA: Insufficient documentation

## 2012-01-27 DIAGNOSIS — E1159 Type 2 diabetes mellitus with other circulatory complications: Secondary | ICD-10-CM | POA: Insufficient documentation

## 2012-01-27 LAB — URINALYSIS, ROUTINE W REFLEX MICROSCOPIC
Glucose, UA: NEGATIVE mg/dL
Hgb urine dipstick: NEGATIVE
Ketones, ur: >80 mg/dL — AB
Protein, ur: 100 mg/dL — AB

## 2012-01-27 LAB — CBC WITH DIFFERENTIAL/PLATELET
Eosinophils Relative: 0 % (ref 0–5)
HCT: 33.2 % — ABNORMAL LOW (ref 36.0–46.0)
Hemoglobin: 11.3 g/dL — ABNORMAL LOW (ref 12.0–15.0)
Lymphocytes Relative: 4 % — ABNORMAL LOW (ref 12–46)
Lymphs Abs: 0.4 10*3/uL — ABNORMAL LOW (ref 0.7–4.0)
MCV: 86.5 fL (ref 78.0–100.0)
Monocytes Relative: 4 % (ref 3–12)
Platelets: 211 10*3/uL (ref 150–400)
RBC: 3.84 MIL/uL — ABNORMAL LOW (ref 3.87–5.11)
WBC: 8.8 10*3/uL (ref 4.0–10.5)

## 2012-01-27 LAB — COMPREHENSIVE METABOLIC PANEL
ALT: 12 U/L (ref 0–35)
Alkaline Phosphatase: 52 U/L (ref 39–117)
CO2: 25 mEq/L (ref 19–32)
Calcium: 9 mg/dL (ref 8.4–10.5)
GFR calc Af Amer: >90 mL/min (ref >90)
GFR calc non Af Amer: 84 mL/min — ABNORMAL LOW (ref >90)
Glucose, Bld: 156 mg/dL — ABNORMAL HIGH (ref 70–99)
Potassium: 3.9 mEq/L (ref 3.5–5.1)
Sodium: 133 mEq/L — ABNORMAL LOW (ref 135–145)

## 2012-01-27 LAB — PRO B NATRIURETIC PEPTIDE: Pro B Natriuretic peptide (BNP): 3020 pg/mL — ABNORMAL HIGH (ref 0–450)

## 2012-01-27 LAB — URINE MICROSCOPIC-ADD ON

## 2012-01-27 MED ORDER — ACETAMINOPHEN 325 MG PO TABS: 650.0000 mg | ORAL_TABLET | Freq: Four times a day (QID) | ORAL | Status: DC | PRN

## 2012-01-27 MED ORDER — ATORVASTATIN CALCIUM 20 MG PO TABS
20.0000 mg | ORAL_TABLET | Freq: Every day | ORAL | Status: DC
  Administered 2012-01-27 – 2012-01-28 (×2): 20 mg via ORAL
  Filled 2012-01-27 (×3): qty 1

## 2012-01-27 MED ORDER — ONDANSETRON HCL 4 MG PO TABS: 4.0000 mg | ORAL_TABLET | Freq: Four times a day (QID) | ORAL | Status: DC | PRN

## 2012-01-27 MED ORDER — ACETAMINOPHEN 650 MG RE SUPP: 650.0000 mg | Freq: Four times a day (QID) | RECTAL | Status: DC | PRN

## 2012-01-27 MED ORDER — METOPROLOL TARTRATE 25 MG PO TABS
37.5000 mg | ORAL_TABLET | Freq: Two times a day (BID) | ORAL | Status: DC
  Administered 2012-01-27 – 2012-01-29 (×4): 37.5 mg via ORAL
  Filled 2012-01-27 (×5): qty 1

## 2012-01-27 MED ORDER — ISOSORBIDE MONONITRATE ER 60 MG PO TB24
60.0000 mg | ORAL_TABLET | Freq: Every day | ORAL | Status: DC
Start: 2012-01-27 — End: 2012-01-29
  Administered 2012-01-27 – 2012-01-29 (×3): 60 mg via ORAL
  Filled 2012-01-27 (×3): qty 1

## 2012-01-27 MED ORDER — PANTOPRAZOLE SODIUM 40 MG PO TBEC
40.0000 mg | DELAYED_RELEASE_TABLET | Freq: Every day | ORAL | Status: DC
  Administered 2012-01-27 – 2012-01-29 (×3): 40 mg via ORAL
  Filled 2012-01-27 (×3): qty 1

## 2012-01-27 MED ORDER — ADULT MULTIVITAMIN W/MINERALS CH
1.0000 | ORAL_TABLET | Freq: Every day | ORAL | Status: DC
  Administered 2012-01-27 – 2012-01-29 (×3): 1 via ORAL
  Filled 2012-01-27 (×3): qty 1

## 2012-01-27 MED ORDER — OMEGA-3-ACID ETHYL ESTERS 1 G PO CAPS
1.0000 g | ORAL_CAPSULE | Freq: Two times a day (BID) | ORAL | Status: DC
  Administered 2012-01-27 – 2012-01-29 (×4): 1 g via ORAL
  Filled 2012-01-27 (×5): qty 1

## 2012-01-27 MED ORDER — INSULIN ASPART 100 UNIT/ML ~~LOC~~ SOLN
0.0000 [IU] | Freq: Every day | SUBCUTANEOUS | Status: DC
  Administered 2012-01-28: 3 [IU] via SUBCUTANEOUS

## 2012-01-27 MED ORDER — RANOLAZINE ER 500 MG PO TB12
1000.0000 mg | ORAL_TABLET | Freq: Two times a day (BID) | ORAL | Status: DC
  Administered 2012-01-27 – 2012-01-29 (×4): 1000 mg via ORAL
  Filled 2012-01-27 (×5): qty 2

## 2012-01-27 MED ORDER — ONDANSETRON HCL 4 MG/2ML IJ SOLN
4.0000 mg | Freq: Four times a day (QID) | INTRAMUSCULAR | Status: DC | PRN
  Administered 2012-01-28 – 2012-01-29 (×2): 4 mg via INTRAVENOUS
  Filled 2012-01-27 (×2): qty 2

## 2012-01-27 MED ORDER — INSULIN ASPART 100 UNIT/ML ~~LOC~~ SOLN
0.0000 [IU] | Freq: Three times a day (TID) | SUBCUTANEOUS | Status: DC
  Administered 2012-01-28: 2 [IU] via SUBCUTANEOUS
  Administered 2012-01-28: 1 [IU] via SUBCUTANEOUS
  Administered 2012-01-29: 3 [IU] via SUBCUTANEOUS

## 2012-01-27 MED ORDER — OMEGA-3 FATTY ACIDS 1000 MG PO CAPS: 1.0000 g | ORAL_CAPSULE | Freq: Two times a day (BID) | ORAL | Status: DC

## 2012-01-27 MED ORDER — ASPIRIN 81 MG PO CHEW
81.0000 mg | CHEWABLE_TABLET | Freq: Every day | ORAL | Status: DC
  Administered 2012-01-27 – 2012-01-29 (×3): 81 mg via ORAL
  Filled 2012-01-27 (×3): qty 1

## 2012-01-27 MED ORDER — SODIUM CHLORIDE 0.9 % IV SOLN: INTRAVENOUS | Status: DC

## 2012-01-27 MED ORDER — NITROGLYCERIN 0.4 MG SL SUBL: 0.4000 mg | SUBLINGUAL_TABLET | SUBLINGUAL | Status: DC | PRN

## 2012-01-27 NOTE — ED Notes (Signed)
Patient transported to CT 

## 2012-01-27 NOTE — ED Provider Notes (Signed)
History     CSN: [id number]  Arrival date & time 01/27/12  1221   First MD Initiated Contact with Patient 01/27/12 1232      Chief Complaint  Patient presents with  . Loss of Consciousness    (Consider location/radiation/quality/duration/timing/severity/associated sxs/prior treatment) HPIRebecca Judie Petit [name] is a 78 y.o. female with a history of coronary artery disease-severe single-vessel LAD with a normal EF, some chronic shortness of breath, hypertension and small strokes without residual neurologic deficits. Patient also has diabetes and hyperlipidemia. Patient had a syncopal episode early this morning, without prodromal symptom. Patient says she has some associated dizziness, and describes that dizziness is both when she is going to pass out and of vertigo-she does have a history of taking meclizine for vertigo. Patient had an episode of vertigo this morning on waking with nausea and vomiting. Patient also arrives with complaints about left-sided chest pain this is sharp it was severe it was relieved with 2 nitroglycerin at home. She has got associated shortness of breath but no diaphoresis, no nausea or vomiting. Says she hit the back of her head, there is no laceration there is no bleeding. She is not complaining about any other pain.  Past Medical History  Diagnosis Date  . Hypertension   . GERD (gastroesophageal reflux disease)   . Unstable angina 12/06/2011  . Edema leg 12/06/2011  . Hyperlipidemia 12/06/2011  . CAD (coronary artery disease), severe single vessel LAD disease, nl. EF 12/08/2011  . Shortness of breath     "all the time since last Friday" (12/12/2011)  . Type II diabetes mellitus   . CKD (chronic kidney disease) stage 2, GFR 60-89 ml/min 12/06/2011  . Stroke     "several small strokes"; denies residual  (12/12/2011)  . Arthritis     "all my joints" (12/12/2011)    Past Surgical History  Procedure Date  . Pseudoaneurysm repair 12/12/2011    "no surgery; did  ultrasonic; right femoral compression"  . Appendectomy   . Cholecystectomy   . Colectomy     "took out 27 1/2 inches" (12/12/2011)  . Lumbar disc surgery     "2 discs removed, spacer put in" (12/12/2011)  . Carpal tunnel release     bilaterally  . Hammer toe surgery     right  . Abdominal hysterectomy   . Back surgery   . Cardiac catheterization 12/07/2011  . Coronary angioplasty with stent placement 12/08/2011    "failed"    No family history on file.  History  Substance Use Topics  . Smoking status: Never Smoker   . Smokeless tobacco: Never Used  . Alcohol Use: No    OB History    Grav Para Term Preterm Abortions TAB SAB Ect Mult Living                  Review of Systems At least 10pt or greater review of systems completed and are negative except where specified in the HPI.  Allergies  Review of patient's allergies indicates no known allergies.  Home Medications   Current Outpatient Rx  Name  Route  Sig  Dispense  Refill  . ASPIRIN 81 MG PO CHEW   Oral   Chew 81 mg by mouth daily.         . ATORVASTATIN CALCIUM 40 MG PO TABS   Oral   Take 20 mg by mouth every evening.         Marland Kitchen FERROUS SULFATE 325 (65 FE) MG PO TABS  Oral   Take 1 tablet (325 mg total) by mouth 3 (three) times daily with meals.   90 tablet   5   . OMEGA-3 FATTY ACIDS 1000 MG PO CAPS   Oral   Take 1 g by mouth 2 (two) times daily.         Marland Kitchen GLIPIZIDE 10 MG PO TABS   Oral   Take 10 mg by mouth daily.         . ISOSORBIDE MONONITRATE ER 30 MG PO TB24   Oral   Take 60 mg by mouth daily.         Marland Kitchen METFORMIN HCL 500 MG PO TABS   Oral   Take 500 mg by mouth 2 (two) times daily with a meal.         . METOPROLOL TARTRATE 25 MG PO TABS   Oral   Take 37.5 mg by mouth 2 (two) times daily.         . ADULT MULTIVITAMIN W/MINERALS CH   Oral   Take 1 tablet by mouth daily.         Marland Kitchen NITROGLYCERIN 0.4 MG SL SUBL   Sublingual   Place 0.4 mg under the tongue every 5  (five) minutes as needed. For chest pain         . OMEGA-3-ACID ETHYL ESTERS 1 G PO CAPS   Oral   Take 1 capsule (1 g total) by mouth 2 (two) times daily.   60 capsule   5   . OMEPRAZOLE 20 MG PO CPDR   Oral   Take 20 mg by mouth daily.         Marland Kitchen RANOLAZINE ER 500 MG PO TB12   Oral   Take 1,000 mg by mouth 2 (two) times daily.           BP 181/80  Pulse 68  Temp 98.2 F (36.8 C) (Oral)  Resp 16  SpO2 95%  Physical Exam  Nursing notes reviewed.  Electronic medical record reviewed. VITAL SIGNS:   Filed Vitals:   01/27/12 1231 01/27/12 1504 01/27/12 1505 01/27/12 1509  BP: 181/80 173/62 184/63 186/75  Pulse: 68 70 70 78  Temp: 98.2 F (36.8 C)     TempSrc: Oral     Resp: 16     SpO2: 95%      CONSTITUTIONAL: Awake, oriented, appears non-toxic HENT: Very small hematoma to the occiput, normocephalic, oral mucosa pink and moist, airway patent. Nares patent without drainage. External ears normal. EYES: Palpebral conjunctiva somewhat pale, EOMI, PERRLA, bilateral pseudophakia NECK: Trachea midline, non-tender in the midline cervical spine - mild tenderness in the right sided sternocleidomastoid, supple CARDIOVASCULAR: Normal heart rate, Normal rhythm PULMONARY/CHEST: Clear to auscultation, no rhonchi, wheezes, or rales. Symmetrical breath sounds. Non-tender. ABDOMINAL: Non-distended, soft, non-tender - no rebound or guarding.  BS normal. NEUROLOGIC: Non-focal, moving all four extremities, no gross sensory or motor deficits. On turning the patient's head rapidly to the left she has symptoms of vertigo including nausea and some mild left facet is nystagmus EXTREMITIES: No clubbing, cyanosis, or edema SKIN: Warm, Dry, No erythema, No rash  ED Course  Procedures (including critical care time)  Date: 01/27/2012  Rate: 69  Rhythm: normal sinus rhythm  QRS Axis: normal  Intervals: normal  ST/T Wave abnormalities: Mild flattening of T waves in V2 and V3  Conduction  Disutrbances: none  Narrative Interpretation: unremarkable - some flattening of T waves in V2 and V3 however no significant  change from last EKG dated 12/08/2011     Labs Reviewed  CBC WITH DIFFERENTIAL - Abnormal; Notable for the following:    RBC 3.84 (*)     Hemoglobin 11.3 (*)     HCT 33.2 (*)     Neutrophils Relative 92 (*)     Neutro Abs 8.1 (*)     Lymphocytes Relative 4 (*)     Lymphs Abs 0.4 (*)     All other components within normal limits  COMPREHENSIVE METABOLIC PANEL - Abnormal; Notable for the following:    Sodium 133 (*)     Glucose, Bld 156 (*)     Total Protein 5.8 (*)     Albumin 3.0 (*)     GFR calc non Af Amer 84 (*)     All other components within normal limits  URINALYSIS, ROUTINE W REFLEX MICROSCOPIC - Abnormal; Notable for the following:    Color, Urine AMBER (*)  BIOCHEMICALS MAY BE AFFECTED BY COLOR   APPearance TURBID (*)     Bilirubin Urine SMALL (*)     Ketones, ur >80 (*)     Protein, ur 100 (*)     All other components within normal limits  URINE MICROSCOPIC-ADD ON - Abnormal; Notable for the following:    Squamous Epithelial / LPF MANY (*)     All other components within normal limits  PROTIME-INR  POCT I-STAT TROPONIN I  POCT CBG (FASTING - GLUCOSE)-MANUAL ENTRY   Ct Head Wo Contrast  01/27/2012  *RADIOLOGY REPORT*  Clinical Data:  Syncopal episode.  Fall.  Head trauma.  Nausea vomiting.  Dizziness.  Neck pain.  CT HEAD WITHOUT CONTRAST CT CERVICAL SPINE WITHOUT CONTRAST  Technique:  Multidetector CT imaging of the head and cervical spine was performed following the standard protocol without intravenous contrast.  Multiplanar CT image reconstructions of the cervical spine were also generated.  Comparison:   None  CT HEAD  Findings: There is no evidence of intracranial hemorrhage, brain edema or other signs of acute infarction.  There is no evidence of intracranial mass lesion or mass effect.  No abnormal extra-axial fluid collections are  identified.  Mild chronic small vessel disease is demonstrated with old bilateral basal ganglia lacuna.  No evidence of hydrocephalus.  No evidence of skull fracture or other significant bone abnormality.  IMPRESSION:  1.  No acute intracranial findings. 2.  Chronic small vessel disease and old bilateral basal ganglia lacunes.  CT CERVICAL SPINE  Findings: No evidence of acute cervical spine fracture or subluxation.  Mild degenerative disc disease is multiple cervical levels.  Advanced facet DJD is seen bilaterally at most cervical levels.  There is also arthropathy with associated pain mass involving the atlantoaxial articulation.  IMPRESSION:  1.  No evidence of acute cervical spine fracture or subluxation. 2.  Cervical spondylosis.   Original Report Authenticated By: Myles Rosenthal, M.D.    Ct Cervical Spine Wo Contrast  01/27/2012  *RADIOLOGY REPORT*  Clinical Data:  Syncopal episode.  Fall.  Head trauma.  Nausea vomiting.  Dizziness.  Neck pain.  CT HEAD WITHOUT CONTRAST CT CERVICAL SPINE WITHOUT CONTRAST  Technique:  Multidetector CT imaging of the head and cervical spine was performed following the standard protocol without intravenous contrast.  Multiplanar CT image reconstructions of the cervical spine were also generated.  Comparison:   None  CT HEAD  Findings: There is no evidence of intracranial hemorrhage, brain edema or other signs of acute infarction.  There is  no evidence of intracranial mass lesion or mass effect.  No abnormal extra-axial fluid collections are identified.  Mild chronic small vessel disease is demonstrated with old bilateral basal ganglia lacuna.  No evidence of hydrocephalus.  No evidence of skull fracture or other significant bone abnormality.  IMPRESSION:  1.  No acute intracranial findings. 2.  Chronic small vessel disease and old bilateral basal ganglia lacunes.  CT CERVICAL SPINE  Findings: No evidence of acute cervical spine fracture or subluxation.  Mild degenerative disc  disease is multiple cervical levels.  Advanced facet DJD is seen bilaterally at most cervical levels.  There is also arthropathy with associated pain mass involving the atlantoaxial articulation.  IMPRESSION:  1.  No evidence of acute cervical spine fracture or subluxation. 2.  Cervical spondylosis.   Original Report Authenticated By: Myles Rosenthal, M.D.    Dg Chest Port 1 View  01/27/2012  *RADIOLOGY REPORT*  Clinical Data: Loss of consciousness  PORTABLE CHEST - 1 VIEW  Comparison: Brain MRI 05/06/2010  Findings: Heart silhouette is enlarged.  Aorta is ectatic.  No effusion, infiltrate, or pneumothorax.  Chronic bronchitic changes are noted.  Small volume of fluid along the right horizontal fissure.  There is a potential nodule in the right perihilar lung measuring 11 mm.  IMPRESSION:  1.  Hyperinflated lungs and small left effusion. 2.  Potential perihilar nodule versus vascular shadow on the right. No comparison available.  Consider CT thorax with contrast for evaluation versus follow-up chest radiograph.   Original Report Authenticated By: Genevive Bi, M.D.      1. Syncope   2. Chest pain   3. Dizziness   4. Vertigo   5. Fall   6. Shortness of breath       MDM  Julia [name] is a 78 y.o. female pertinent coronary artery disease history presents with syncope and a mixed complaints of dizziness, vertigo and some chest pain as well. Patient has no midline neck pain. Presentation is concerning for ACS, arrhythmia - this point neurologic exam is negative aside some likely peripheral vertigo. Patient does have a small lump in the back of her head we'll obtain a CT of the patient's head and neck. Labs obtained including cardiac biomarkers.  Patient is not orthostatic, her EKG is normal. She appears a little dehydrated. CTs of the head and neck are within normal limits. Troponins negative. CBC and CMP are unremarkable acutely.  Discussed with Dr. Timothy Lasso to admit the patient for further  observation and treatment.  D/w Dr. Timothy Lasso of [facility] for admission.  D/w NP Annie Paras from [facility] to see the pt today.   Jones Skene, MD 01/27/12 1649

## 2012-01-27 NOTE — Consult Note (Signed)
Reason for Consult: syncope and chest pain  PCP:  Dr. Jacky Kindle Cardiologist: Dr. Allyson Sabal  Referring Physician: Dr. Gilford Raid Julia [name] is an 78 y.o. female.    Chief Complaint: syncope today, followed by chest pain   HPI: 78 y.o. female with a history of coronary artery disease-severe single-vessel LAD with a normal EF, some chronic shortness of breath, hypertension and small strokes without residual neurologic deficits. Patient also has diabetes and hyperlipidemia. Patient had a syncopal episode last pm, with minimal dizziness just prior to syncope.  She did not want to be evaluated last night, she just went to bed.   Patient says she has some associated dizziness, and describes that dizziness is both when she is going to pass out and of vertigo-she does have a history of taking meclizine for vertigo. Patient had an episode of vertigo this morning on waking with nausea and vomiting.  This morning it was severe vertigo, she could not sit up. Patient also arrives with complaints about left-sided chest chest pressure, it was relieved with 2 nitroglycerin at home. She has got associated shortness of breath but no diaphoresis, no nausea or vomiting. Says she hit the back of her head, there is no laceration there is no bleeding. She is not complaining about any other pain.  She has not been able to eat much the last 2-3 days.  No appetite, though today with nausea.   She was seen by Dr. Tresa Endo on 01/24/12 secondary to recurrent episodes of chest pain.  At that visit medications were adjusted.  Pt had cardiac cath 12/07/11 revealing a severely calcified subtotal/total LAD occlusion proximally in the region of a diagonal vessel.   Also a 60% RCA stenosis prior to the crux and 50% mid PDA stenosis.  An attempt to intervene on LAD by Dr. Tresa Endo was unsuccessful due to a total occlusion not amenable to PCI despite use of multiple wires to cross the legion.  Medication therapy was then recommended.  She was  started on Ranexa and Imdur and her previous Inderal was changed to metoprolol.   At the office visit, her symptoms overall may have improved she continued with multiple episodes of chest pain.  Dr. Tresa Endo increased her Ranexa to 1000 BID, and IMDUR to 60 mg.  He also added HCTZ 12.5 (she had 1+ ankle edema on that visit), but she did not have HCTZ filled because pharmacy was out of medication.     EKG without acute EKG changes.   Troponin negative on arrival.  No chest pressure now. BP sitting 181/80 and standing 156/64.   Past Medical History  Diagnosis Date  . Hypertension   . GERD (gastroesophageal reflux disease)   . Unstable angina 12/06/2011  . Edema leg 12/06/2011  . Hyperlipidemia 12/06/2011  . CAD (coronary artery disease), severe single vessel LAD disease, nl. EF 12/08/2011  . Shortness of breath     "all the time since last Friday" (12/12/2011)  . Type II diabetes mellitus   . CKD (chronic kidney disease) stage 2, GFR 60-89 ml/min 12/06/2011  . Stroke     "several small strokes"; denies residual  (12/12/2011)  . Arthritis     "all my joints" (12/12/2011)    Past Surgical History  Procedure Date  . Pseudoaneurysm repair 12/12/2011    "no surgery; did ultrasonic; right femoral compression"  . Appendectomy   . Cholecystectomy   . Colectomy     "took out 27 1/2 inches" (12/12/2011)  . Lumbar  disc surgery     "2 discs removed, spacer put in" (12/12/2011)  . Carpal tunnel release     bilaterally  . Hammer toe surgery     right  . Abdominal hysterectomy   . Back surgery   . Cardiac catheterization 12/07/2011  . Coronary angioplasty with stent placement 12/08/2011    "failed"    No family history on file. Social History:  reports that she has never smoked. She has never used smokeless tobacco. She reports that she does not drink alcohol or use illicit drugs.  Allergies: No Known Allergies  Outpatient Medications:  Results for orders placed during the hospital  encounter of 01/27/12 (from the past 48 hour(s))  CBC WITH DIFFERENTIAL     Status: Abnormal   Collection Time   01/27/12 12:26 PM      Component Value Range Comment   WBC 8.8  4.0 - 10.5 K/uL    RBC 3.84 (*) 3.87 - 5.11 MIL/uL    Hemoglobin 11.3 (*) 12.0 - 15.0 g/dL    HCT 14.7 (*) 82.9 - 46.0 %    MCV 86.5  78.0 - 100.0 fL    MCH 29.4  26.0 - 34.0 pg    MCHC 34.0  30.0 - 36.0 g/dL    RDW 56.2  13.0 - 86.5 %    Platelets 211  150 - 400 K/uL    Neutrophils Relative 92 (*) 43 - 77 %    Neutro Abs 8.1 (*) 1.7 - 7.7 K/uL    Lymphocytes Relative 4 (*) 12 - 46 %    Lymphs Abs 0.4 (*) 0.7 - 4.0 K/uL    Monocytes Relative 4  3 - 12 %    Monocytes Absolute 0.3  0.1 - 1.0 K/uL    Eosinophils Relative 0  0 - 5 %    Eosinophils Absolute 0.0  0.0 - 0.7 K/uL    Basophils Relative 0  0 - 1 %    Basophils Absolute 0.0  0.0 - 0.1 K/uL   COMPREHENSIVE METABOLIC PANEL     Status: Abnormal   Collection Time   01/27/12 12:26 PM      Component Value Range Comment   Sodium 133 (*) 135 - 145 mEq/L    Potassium 3.9  3.5 - 5.1 mEq/L    Chloride 96  96 - 112 mEq/L    CO2 25  19 - 32 mEq/L    Glucose, Bld 156 (*) 70 - 99 mg/dL    BUN 10  6 - 23 mg/dL    Creatinine, Ser 7.84  0.50 - 1.10 mg/dL    Calcium 9.0  8.4 - 69.6 mg/dL    Total Protein 5.8 (*) 6.0 - 8.3 g/dL    Albumin 3.0 (*) 3.5 - 5.2 g/dL    AST 18  0 - 37 U/L    ALT 12  0 - 35 U/L    Alkaline Phosphatase 52  39 - 117 U/L    Total Bilirubin 1.2  0.3 - 1.2 mg/dL    GFR calc non Af Amer 84 (*) >90 mL/min    GFR calc Af Amer >90  >90 mL/min   PROTIME-INR     Status: Normal   Collection Time   01/27/12 12:26 PM      Component Value Range Comment   Prothrombin Time 12.8  11.6 - 15.2 seconds    INR 0.97  0.00 - 1.49   POCT I-STAT TROPONIN I     Status:  Normal   Collection Time   01/27/12 12:47 PM      Component Value Range Comment   Troponin i, poc 0.01  0.00 - 0.08 ng/mL    Comment 3            URINALYSIS, ROUTINE W REFLEX  MICROSCOPIC     Status: Abnormal   Collection Time   01/27/12  3:14 PM      Component Value Range Comment   Color, Urine AMBER (*) YELLOW BIOCHEMICALS MAY BE AFFECTED BY COLOR   APPearance TURBID (*) CLEAR    Specific Gravity, Urine 1.019  1.005 - 1.030    pH 7.5  5.0 - 8.0    Glucose, UA NEGATIVE  NEGATIVE mg/dL    Hgb urine dipstick NEGATIVE  NEGATIVE    Bilirubin Urine SMALL (*) NEGATIVE    Ketones, ur >80 (*) NEGATIVE mg/dL    Protein, ur 161 (*) NEGATIVE mg/dL    Urobilinogen, UA 0.2  0.0 - 1.0 mg/dL    Nitrite NEGATIVE  NEGATIVE    Leukocytes, UA NEGATIVE  NEGATIVE   URINE MICROSCOPIC-ADD ON     Status: Abnormal   Collection Time   01/27/12  3:14 PM      Component Value Range Comment   Squamous Epithelial / LPF MANY (*) RARE    WBC, UA 3-6  <3 WBC/hpf    Urine-Other AMORPHOUS URATES/PHOSPHATES      Ct Head Wo Contrast  01/27/2012  *RADIOLOGY REPORT*  Clinical Data:  Syncopal episode.  Fall.  Head trauma.  Nausea vomiting.  Dizziness.  Neck pain.  CT HEAD WITHOUT CONTRAST CT CERVICAL SPINE WITHOUT CONTRAST  Technique:  Multidetector CT imaging of the head and cervical spine was performed following the standard protocol without intravenous contrast.  Multiplanar CT image reconstructions of the cervical spine were also generated.  Comparison:   None  CT HEAD  Findings: There is no evidence of intracranial hemorrhage, brain edema or other signs of acute infarction.  There is no evidence of intracranial mass lesion or mass effect.  No abnormal extra-axial fluid collections are identified.  Mild chronic small vessel disease is demonstrated with old bilateral basal ganglia lacuna.  No evidence of hydrocephalus.  No evidence of skull fracture or other significant bone abnormality.  IMPRESSION:  1.  No acute intracranial findings. 2.  Chronic small vessel disease and old bilateral basal ganglia lacunes.  CT CERVICAL SPINE  Findings: No evidence of acute cervical spine fracture or subluxation.   Mild degenerative disc disease is multiple cervical levels.  Advanced facet DJD is seen bilaterally at most cervical levels.  There is also arthropathy with associated pain mass involving the atlantoaxial articulation.  IMPRESSION:  1.  No evidence of acute cervical spine fracture or subluxation. 2.  Cervical spondylosis.   Original Report Authenticated By: Myles Rosenthal, M.D.    Ct Cervical Spine Wo Contrast  01/27/2012  *RADIOLOGY REPORT*  Clinical Data:  Syncopal episode.  Fall.  Head trauma.  Nausea vomiting.  Dizziness.  Neck pain.  CT HEAD WITHOUT CONTRAST CT CERVICAL SPINE WITHOUT CONTRAST  Technique:  Multidetector CT imaging of the head and cervical spine was performed following the standard protocol without intravenous contrast.  Multiplanar CT image reconstructions of the cervical spine were also generated.  Comparison:   None  CT HEAD  Findings: There is no evidence of intracranial hemorrhage, brain edema or other signs of acute infarction.  There is no evidence of intracranial mass lesion or mass effect.  No abnormal extra-axial fluid collections are identified.  Mild chronic small vessel disease is demonstrated with old bilateral basal ganglia lacuna.  No evidence of hydrocephalus.  No evidence of skull fracture or other significant bone abnormality.  IMPRESSION:  1.  No acute intracranial findings. 2.  Chronic small vessel disease and old bilateral basal ganglia lacunes.  CT CERVICAL SPINE  Findings: No evidence of acute cervical spine fracture or subluxation.  Mild degenerative disc disease is multiple cervical levels.  Advanced facet DJD is seen bilaterally at most cervical levels.  There is also arthropathy with associated pain mass involving the atlantoaxial articulation.  IMPRESSION:  1.  No evidence of acute cervical spine fracture or subluxation. 2.  Cervical spondylosis.   Original Report Authenticated By: Myles Rosenthal, M.D.    Dg Chest Port 1 View  01/27/2012  *RADIOLOGY REPORT*  Clinical  Data: Loss of consciousness  PORTABLE CHEST - 1 VIEW  Comparison: Brain MRI 05/06/2010  Findings: Heart silhouette is enlarged.  Aorta is ectatic.  No effusion, infiltrate, or pneumothorax.  Chronic bronchitic changes are noted.  Small volume of fluid along the right horizontal fissure.  There is a potential nodule in the right perihilar lung measuring 11 mm.  IMPRESSION:  1.  Hyperinflated lungs and small left effusion. 2.  Potential perihilar nodule versus vascular shadow on the right. No comparison available.  Consider CT thorax with contrast for evaluation versus follow-up chest radiograph.   Original Report Authenticated By: Genevive Bi, M.D.     ROS: General:no colds or fevers, no weight changes Skin:no rashes or ulcers HEENT:no blurred vision, no congestion CV:see HPI PUL:see HPI- some SOB GI:no diarrhea + constipation with IRON, no melena, no indigestion, + nausea and vomiting GU:no hematuria, no dysuria MS:no joint pain, no claudication Neuro:+ syncope, + room spinning dizziness Endo:+ diabetes, no thyroid disease   Blood pressure 186/75, pulse 78, temperature 98.2 F (36.8 C), temperature source Oral, resp. rate 16, SpO2 95.00%. PE: General:alert and oriented, pleasant affect. No acute distress Skin:warm to very warm and dry. Brisk capillary refill. HEENT:normocephalic, sclera clear Neck:supple, no carotid bruits Heart:S1S2 RRR soft, systolic murmur Lungs: clear without rales or rhonchi Abd:+ BS soft, non tender Ext:no edema, 2+ pedal pulses Neuro:alert and oriented X 3, MAE follows commands.    Assessment/Plan Principal Problem:  *Syncope Active Problems:  HTN (hypertension)  CAD (coronary artery disease), severe single vessel LAD disease, nl. EF  Nausea and vomiting  PLAN: This may be a combination of increase of medications causing decreased appetite, in addition to her Vertigo  +/- viral gastroenteritis with  Dehydration alone.  Though with severely diseased LAD  will follow cardiac enzymes with her chest pressure.  See Dr. Erin Hearing note for further plan.  INGOLD,LAURA R 01/27/2012, 5:01 PM    I have seen and examined the patient along with [facility] R, NP.  I have reviewed the chart, notes and new data.  I agree with NP's note.  Key new complaints: no angina now, but she consumes large quantities of SL NTG. Some dyspnea on exertion. Key examination changes: BP is high, no arrhythmia, no signs of AF. Considerable orthostatic drop in SBP, but only to 150 mm Hg Key new findings / data: ECG is normal except for mildly prolonged QT interval, attributable to ranexa.  PLAN: Keep on monitor, but presentation is not suggestive of arrhythmia. More likely hypotension. Agree with IVF. Difficult to balance antianginal Rx requirements with side effects of orthostatic hypotension and dizziness. May need  to reconsider CABG as an option, but she is a difficult candidate due to her age and small frame.  Thurmon Fair, MD, [facility] [phone] 01/27/2012, 6:05 PM

## 2012-01-27 NOTE — ED Notes (Signed)
Pt denies any chest pain, sob, dizziness, nausea, or abdominal pain. Pt reports no complaints. Aware of poc.

## 2012-01-27 NOTE — H&P (Signed)
Julia [name] is an 78 y.o. female.   PCP:   Minda Meo, MD   Chief Complaint:  Syncope, Fall, CP s/p 2 SL NTG  HPI: 21 F with know CAD presented to ED after son convinced her to come 12 hrs after her "event" She reports getting dizzy last night and "blacking out - Syncope".  She also had N/V on awakening with sweats and son said her head felt febrile.   Pt with a hx of vertigo and is chronic on/off dizzy. She hit the back of her head, hematoma noted. Pt is alert and oriented with no neuro deficits. Pt reports seeing Dr Tresa Endo on Wednesday who increased Renexa to 1000 BID, Isosrbide 60 Daily and cutting Lipitor to 20 mg daily. Pt with noted drop in O2 sats while talking - pt reports for several months now she has got sob with talking and walking. She also had CP and SOB last night - Took 2 NTG.  In ED W/Up was unremarkable.  CCT showed 1. No acute intracranial findings. 2. Chronic small vessel disease and old bilateral basal ganglia  Lacunes. CT Neck showed 1. No evidence of acute cervical spine fracture or subluxation. 2. Cervical spondylosis.  CXR showed 1. Hyperinflated lungs and small left effusion. 2. Potential perihilar nodule versus vascular shadow on the right. No comparison available. Consider CT thorax with contrast for evaluation versus follow-up chest radiograph.  CMET, CBC and UA unremarkable.  Trop I (-)  Recently seen in our office 01/23/2012 for increased swelling and SOB/DOE - Sats were 97 %.  At that OV they reviewed Julia [name] recent cardiac cath and unsuccessful attempt at LAD PCI.  She had complication of pseudoaneurysm removal by Dr. Donneta Romberg.  She continued to have trouble sleeping since hospitalization including insomnia/can't fall asleep because she is very worried.  She continues to report DOE and CP.  Can't get to the mailbox without stopping to breathe.  She is using nitrogylcerin twice daily.  For her angina the goals are RF control, BP  <130/80 and remain on  max med therapy with close following with Dr Tresa Endo (She saw him on Wednesday with changes listed above.  For her DOE and Edema  - fluid and salt restriction was recommended with LE Elevation - meds were not adjusted.  Her BP was terrible at SBP 190 a and she was very anxious as well.     She will need admit and Cards consultation. She was stood up in ED and Dizzy returned. Last Vomiting episode occurred at @ 3:30 pm    Past Medical History:  Past Medical History  Diagnosis Date  . Hypertension   . GERD (gastroesophageal reflux disease)   . Unstable angina 12/06/2011  . Edema leg 12/06/2011  . Hyperlipidemia 12/06/2011  . CAD (coronary artery disease), severe single vessel LAD disease, nl. EF 12/08/2011  . Shortness of breath     "all the time since last Friday" (12/12/2011)  . Type II diabetes mellitus   . CKD (chronic kidney disease) stage 2, GFR 60-89 ml/min 12/06/2011  . Stroke     "several small strokes"; denies residual  (12/12/2011)  . Arthritis     "all my joints" (12/12/2011)   gerd, lipids, dm2 ,htn, OA memory loss- testing 4/12 cardiac cath-11/13    Past Surgical History  Procedure Date  . Pseudoaneurysm repair 12/12/2011    "no surgery; did ultrasonic; right femoral compression"  . Appendectomy   . Cholecystectomy   . Colectomy     "  took out 27 1/2 inches" (12/12/2011)  . Lumbar disc surgery     "2 discs removed, spacer put in" (12/12/2011)  . Carpal tunnel release     bilaterally  . Hammer toe surgery     right  . Abdominal hysterectomy   . Back surgery   . Cardiac catheterization 12/07/2011  . Coronary angioplasty with stent placement 12/08/2011    "failed"      Allergies:  No Known Allergies   Medications: Prior to Admission medications   Medication Sig Start Date End Date Taking? Authorizing Provider  aspirin 81 MG chewable tablet Chew 81 mg by mouth daily.   Yes Historical Provider, MD  atorvastatin (LIPITOR) 40 MG tablet Take 20 mg by  mouth every evening.   Yes Historical Provider, MD  ferrous sulfate 325 (65 FE) MG tablet Take 1 tablet (325 mg total) by mouth 3 (three) times daily with meals. 12/10/11  Yes Wilburt Finlay, PA  fish oil-omega-3 fatty acids 1000 MG capsule Take 1 g by mouth 2 (two) times daily.   Yes Historical Provider, MD  glipiZIDE (GLUCOTROL) 10 MG tablet Take 10 mg by mouth daily.   Yes Historical Provider, MD  isosorbide mononitrate (IMDUR) 30 MG 24 hr tablet Take 60 mg by mouth daily. 12/10/11  Yes Wilburt Finlay, PA  metFORMIN (GLUCOPHAGE) 500 MG tablet Take 500 mg by mouth 2 (two) times daily with a meal.   Yes Historical Provider, MD  metoprolol tartrate (LOPRESSOR) 25 MG tablet Take 37.5 mg by mouth 2 (two) times daily.   Yes Historical Provider, MD  Multiple Vitamin (MULTIVITAMIN WITH MINERALS) TABS Take 1 tablet by mouth daily.   Yes Historical Provider, MD  nitroGLYCERIN (NITROSTAT) 0.4 MG SL tablet Place 0.4 mg under the tongue every 5 (five) minutes as needed. For chest pain   Yes Historical Provider, MD  omega-3 acid ethyl esters (LOVAZA) 1 G capsule Take 1 capsule (1 g total) by mouth 2 (two) times daily. 12/10/11  Yes Wilburt Finlay, PA  omeprazole (PRILOSEC) 20 MG capsule Take 20 mg by mouth daily.   Yes Historical Provider, MD  ranolazine (RANEXA) 500 MG 12 hr tablet Take 1,000 mg by mouth 2 (two) times daily. 12/10/11  Yes Wilburt Finlay, PA     1)  Ranexa 500 Mg Xr12h-tab (Ranolazine) .... Take 1 tablet by mouth twice daily 2)  Nitrostat 0.4 Mg Subl (Nitroglycerin) .... Take as directed 3)  Metoprolol Tartrate 50 Mg Tabs (Metoprolol tartrate) .... Take 1 tablet by mouth twice daily 4)  Imdur 30 Mg Xr24h-tab (Isosorbide mononitrate) .... Take 1 tablet by mouth everyday 5)  Feosol 325 (65 Fe) Mg Tabs (Ferrous sulfate) .... Take 1 tablet by mouth three times daily 6)  Lipitor 80 Mg Tabs (Atorvastatin calcium) .... Take 1 tablet by mouth everyday 7)  Meclizine Hcl 25 Mg Tabs (Meclizine hcl) .... On  hold 8)  Accu-chek Nano Smartview W/device Kit (Blood glucose monitoring suppl) .... Check bs 2 times qd as directed 9)  Accu-chek Compact Test Drum Strp (Glucose blood) .... Check bs as directed  2 times qd 10)  Aspirin 81 Mg Ec Tab (Aspirin) .... Take one (1) tablet by mouth daily 11)  Glucophage 500 Mg Tabs (Metformin hcl) .... Take one po twice daily 12)  Glucotrol Xl 10 Mg Xr24h-tab (Glipizide) .... Take 1 po daily 13)  Multivitamins Tabs (Multiple vitamin) .... Take one tablet by mouth every day 14)  Protonix 20 Mg Tbec (Pantoprazole sodium) .... Take 1  tablet by mouth daily 15)  Accu-chek Aviva Strp (Glucose blood) .... Check bg bid or prn 16)  Fish Oil 1000 Mg Caps (Omega-3 fatty acids) .... One tablet by mouth twice a day   (Not in a hospital admission)   Social History:  reports that she has never smoked. She has never used smokeless tobacco. She reports that she does not drink alcohol or use illicit drugs.  Family History: No family history on file.  Review of Systems:  Review of Systems - Sleep disturbance, LE Edema, anxiety,  Complains of chest pains, claudication, dyspnea on exertion, peripheral edema.  Constipation dizzy  Physical Exam:  Blood pressure 186/75, pulse 78, temperature 98.2 F (36.8 C), temperature source Oral, resp. rate 16, SpO2 95.00%. Filed Vitals:   01/27/12 1231 01/27/12 1504 01/27/12 1505 01/27/12 1509  BP: 181/80 173/62 184/63 186/75  Pulse: 68 70 70 78  Temp: 98.2 F (36.8 C)     TempSrc: Oral     Resp: 16     SpO2: 95%      General appearance: old and worn out.  Thin and frail Head: posterior hematoma.Eyes: conjunctivae/corneas clear. PERRL, EOM's intact.  Nose: Nares normal. Septum midline. Mucosa normal. No drainage or sinus tenderness. Throat: lips, mucosa, and tongue normal; teeth and gums normal Neck: no adenopathy, no carotid bruit + JVD  Resp: CTA Cardio: Reg, m GI: soft, non-tender; bowel sounds normal; no masses,  no  organomegaly Extremities:  Min E Pulses: 2+ and symmetric Lymph nodes: no cervical lymphadenopathy Neurologic: Alert and oriented X 3, weak and frail appearing.    Labs on Admission:   [facility] 01/27/12 1226  NA 133*  K 3.9  CL 96  CO2 25  GLUCOSE 156*  BUN 10  CREATININE 0.58  CALCIUM 9.0  MG --  PHOS --    Basename 01/27/12 1226  AST 18  ALT 12  ALKPHOS 52  BILITOT 1.2  PROT 5.8*  ALBUMIN 3.0*   No results found for this basename: LIPASE:2,AMYLASE:2 in the last 72 hours  Basename 01/27/12 1226  WBC 8.8  NEUTROABS 8.1*  HGB 11.3*  HCT 33.2*  MCV 86.5  PLT 211   No results found for this basename: CKTOTAL:3,CKMB:3,CKMBINDEX:3,TROPONINI:3 in the last 72 hours Lab Results  Component Value Date   INR 0.97 01/27/2012   INR 1.06 12/07/2011   INR 1.00 12/06/2011     LAB RESULT POCT:  Results for orders placed during the hospital encounter of 01/27/12  CBC WITH DIFFERENTIAL      Component Value Range   WBC 8.8  4.0 - 10.5 K/uL   RBC 3.84 (*) 3.87 - 5.11 MIL/uL   Hemoglobin 11.3 (*) 12.0 - 15.0 g/dL   HCT 16.1 (*) 09.6 - 04.5 %   MCV 86.5  78.0 - 100.0 fL   MCH 29.4  26.0 - 34.0 pg   MCHC 34.0  30.0 - 36.0 g/dL   RDW 40.9  81.1 - 91.4 %   Platelets 211  150 - 400 K/uL   Neutrophils Relative 92 (*) 43 - 77 %   Neutro Abs 8.1 (*) 1.7 - 7.7 K/uL   Lymphocytes Relative 4 (*) 12 - 46 %   Lymphs Abs 0.4 (*) 0.7 - 4.0 K/uL   Monocytes Relative 4  3 - 12 %   Monocytes Absolute 0.3  0.1 - 1.0 K/uL   Eosinophils Relative 0  0 - 5 %   Eosinophils Absolute 0.0  0.0 - 0.7 K/uL  Basophils Relative 0  0 - 1 %   Basophils Absolute 0.0  0.0 - 0.1 K/uL  COMPREHENSIVE METABOLIC PANEL      Component Value Range   Sodium 133 (*) 135 - 145 mEq/L   Potassium 3.9  3.5 - 5.1 mEq/L   Chloride 96  96 - 112 mEq/L   CO2 25  19 - 32 mEq/L   Glucose, Bld 156 (*) 70 - 99 mg/dL   BUN 10  6 - 23 mg/dL   Creatinine, Ser 8.65  0.50 - 1.10 mg/dL   Calcium 9.0  8.4 - 78.4 mg/dL    Total Protein 5.8 (*) 6.0 - 8.3 g/dL   Albumin 3.0 (*) 3.5 - 5.2 g/dL   AST 18  0 - 37 U/L   ALT 12  0 - 35 U/L   Alkaline Phosphatase 52  39 - 117 U/L   Total Bilirubin 1.2  0.3 - 1.2 mg/dL   GFR calc non Af Amer 84 (*) >90 mL/min   GFR calc Af Amer >90  >90 mL/min  PROTIME-INR      Component Value Range   Prothrombin Time 12.8  11.6 - 15.2 seconds   INR 0.97  0.00 - 1.49  URINALYSIS, ROUTINE W REFLEX MICROSCOPIC      Component Value Range   Color, Urine AMBER (*) YELLOW   APPearance TURBID (*) CLEAR   Specific Gravity, Urine 1.019  1.005 - 1.030   pH 7.5  5.0 - 8.0   Glucose, UA NEGATIVE  NEGATIVE mg/dL   Hgb urine dipstick NEGATIVE  NEGATIVE   Bilirubin Urine SMALL (*) NEGATIVE   Ketones, ur >80 (*) NEGATIVE mg/dL   Protein, ur 696 (*) NEGATIVE mg/dL   Urobilinogen, UA 0.2  0.0 - 1.0 mg/dL   Nitrite NEGATIVE  NEGATIVE   Leukocytes, UA NEGATIVE  NEGATIVE  POCT I-STAT TROPONIN I      Component Value Range   Troponin i, poc 0.01  0.00 - 0.08 ng/mL   Comment 3           URINE MICROSCOPIC-ADD ON      Component Value Range   Squamous Epithelial / LPF MANY (*) RARE   WBC, UA 3-6  <3 WBC/hpf   Urine-Other AMORPHOUS URATES/PHOSPHATES        Radiological Exams on Admission: Ct Head Wo Contrast  01/27/2012  *RADIOLOGY REPORT*  Clinical Data:  Syncopal episode.  Fall.  Head trauma.  Nausea vomiting.  Dizziness.  Neck pain.  CT HEAD WITHOUT CONTRAST CT CERVICAL SPINE WITHOUT CONTRAST  Technique:  Multidetector CT imaging of the head and cervical spine was performed following the standard protocol without intravenous contrast.  Multiplanar CT image reconstructions of the cervical spine were also generated.  Comparison:   None  CT HEAD  Findings: There is no evidence of intracranial hemorrhage, brain edema or other signs of acute infarction.  There is no evidence of intracranial mass lesion or mass effect.  No abnormal extra-axial fluid collections are identified.  Mild chronic small  vessel disease is demonstrated with old bilateral basal ganglia lacuna.  No evidence of hydrocephalus.  No evidence of skull fracture or other significant bone abnormality.  IMPRESSION:  1.  No acute intracranial findings. 2.  Chronic small vessel disease and old bilateral basal ganglia lacunes.  CT CERVICAL SPINE  Findings: No evidence of acute cervical spine fracture or subluxation.  Mild degenerative disc disease is multiple cervical levels.  Advanced facet DJD is seen bilaterally at  most cervical levels.  There is also arthropathy with associated pain mass involving the atlantoaxial articulation.  IMPRESSION:  1.  No evidence of acute cervical spine fracture or subluxation. 2.  Cervical spondylosis.   Original Report Authenticated By: Myles Rosenthal, M.D.    Ct Cervical Spine Wo Contrast  01/27/2012  *RADIOLOGY REPORT*  Clinical Data:  Syncopal episode.  Fall.  Head trauma.  Nausea vomiting.  Dizziness.  Neck pain.  CT HEAD WITHOUT CONTRAST CT CERVICAL SPINE WITHOUT CONTRAST  Technique:  Multidetector CT imaging of the head and cervical spine was performed following the standard protocol without intravenous contrast.  Multiplanar CT image reconstructions of the cervical spine were also generated.  Comparison:   None  CT HEAD  Findings: There is no evidence of intracranial hemorrhage, brain edema or other signs of acute infarction.  There is no evidence of intracranial mass lesion or mass effect.  No abnormal extra-axial fluid collections are identified.  Mild chronic small vessel disease is demonstrated with old bilateral basal ganglia lacuna.  No evidence of hydrocephalus.  No evidence of skull fracture or other significant bone abnormality.  IMPRESSION:  1.  No acute intracranial findings. 2.  Chronic small vessel disease and old bilateral basal ganglia lacunes.  CT CERVICAL SPINE  Findings: No evidence of acute cervical spine fracture or subluxation.  Mild degenerative disc disease is multiple cervical  levels.  Advanced facet DJD is seen bilaterally at most cervical levels.  There is also arthropathy with associated pain mass involving the atlantoaxial articulation.  IMPRESSION:  1.  No evidence of acute cervical spine fracture or subluxation. 2.  Cervical spondylosis.   Original Report Authenticated By: Myles Rosenthal, M.D.    Dg Chest Port 1 View  01/27/2012  *RADIOLOGY REPORT*  Clinical Data: Loss of consciousness  PORTABLE CHEST - 1 VIEW  Comparison: Brain MRI 05/06/2010  Findings: Heart silhouette is enlarged.  Aorta is ectatic.  No effusion, infiltrate, or pneumothorax.  Chronic bronchitic changes are noted.  Small volume of fluid along the right horizontal fissure.  There is a potential nodule in the right perihilar lung measuring 11 mm.  IMPRESSION:  1.  Hyperinflated lungs and small left effusion. 2.  Potential perihilar nodule versus vascular shadow on the right. No comparison available.  Consider CT thorax with contrast for evaluation versus follow-up chest radiograph.   Original Report Authenticated By: Genevive Bi, M.D.       Orders placed during the hospital encounter of 01/27/12  . ED EKG  . EKG 12-LEAD  . ED EKG  . EKG 12-LEAD  . EKG 12-LEAD  . EKG 12-LEAD     Assessment/Plan Principal Problem:  *Syncope Active Problems:  HTN (hypertension)  CAD (coronary artery disease), severe single vessel LAD disease, nl. EF  Nausea and vomiting  Syncope with N/V and possible low grade fever.  This may be possible Viral Gastroenteritis and if that is the case some zofran and min fluids may help.  If Cardiac induced Syncope there are only poor options as CABG would be difficult.  If she is having Arrhythmias options would seem limited as well.  Admit, check seriel CK/Trop I.   Cards to consult. Get up tomorrow with PT.  Gentle Hydration.  EF was 65% on cath.  ECHO and KUB ordered.  Check BNP. Severe Single vessel CAD with recent Cath showing 100% LAD not amenable to PCI and  complicated by Pseudoaneurysm requiring procedure. Falls - PT/OT/SW CKDz 2 - Cr 0.58 and CC  fine.  Follow DM2 - ISS, Hold Metformin. Memory loss - stable and not appreciated on todays exam HTN - needs tighter control Lipids - on Rx - Dr Tresa Endo just lowered dose. GERD - PPI Poss Pulm Nodule on CXR  = Potential perihilar nodule versus vascular shadow on the right. No comparison available. Consider CT thorax with contrast for evaluation versus follow-up chest radiograph.  Will start with serial chest x rays Hyperinflated lungs and small left effusion - follow Min Anemia - Hbg 11.3. Hold Iron as she c/o severe constipation. Hyponatremia - Min = 133 DVT proph Consider AL Consider DNR  Alita Waldren M 01/27/2012, 5:18 PM

## 2012-01-27 NOTE — ED Notes (Signed)
Pt arrives to ed via [facility] for syncopal episode last night resulting in fall.  Pt had n/v upon wakening this am.  Pt hx of "dizzyness". Pt caox4. Pt has edema in mid back skull-no bleed/lac.  perrl-yet sluggish.  NAD.

## 2012-01-27 NOTE — ED Notes (Signed)
Cardiology at bedside.

## 2012-01-27 NOTE — ED Notes (Addendum)
Pt reports getting dizzy last night and "blacking out." pt with a hx of vertigo and is on meds for the same. Reports hitting back of head, hematoma noted. Pt is alert and oriented at this time with no neuro deficits. Pt reports seeing cardiology dr Tresa Endo on Wednesday. Pt with noted drop in O2 sats while talking, pt reports for several months now she has got sob with talking.

## 2012-01-27 NOTE — ED Notes (Signed)
Pt sts takes nitro sl for chest "tighness".  Took 2 0.4 sl PTA and last night prior to fall.  Currently under care of [facility] cardiology.

## 2012-01-28 ENCOUNTER — Inpatient Hospital Stay (HOSPITAL_COMMUNITY): Payer: Medicare Other

## 2012-01-28 LAB — GLUCOSE, CAPILLARY
Glucose-Capillary: 104 mg/dL — ABNORMAL HIGH (ref 70–99)
Glucose-Capillary: 183 mg/dL — ABNORMAL HIGH (ref 70–99)
Glucose-Capillary: 135 mg/dL — ABNORMAL HIGH (ref 70–99)

## 2012-01-28 LAB — COMPREHENSIVE METABOLIC PANEL
AST: 14 U/L (ref 0–37)
Albumin: 2.5 g/dL — ABNORMAL LOW (ref 3.5–5.2)
Calcium: 9.2 mg/dL (ref 8.4–10.5)
Chloride: 101 mEq/L (ref 96–112)
Creatinine, Ser: 0.7 mg/dL (ref 0.50–1.10)
Total Bilirubin: 0.7 mg/dL (ref 0.3–1.2)
Total Protein: 4.9 g/dL — ABNORMAL LOW (ref 6.0–8.3)

## 2012-01-28 LAB — TROPONIN I: Troponin I: <0.3 ng/mL (ref <0.30)

## 2012-01-28 LAB — CBC
HCT: 30.1 % — ABNORMAL LOW (ref 36.0–46.0)
Hemoglobin: 10.2 g/dL — ABNORMAL LOW (ref 12.0–15.0)
MCH: 29.2 pg (ref 26.0–34.0)
MCV: 86.2 fL (ref 78.0–100.0)
RBC: 3.49 MIL/uL — ABNORMAL LOW (ref 3.87–5.11)

## 2012-01-28 LAB — CK ISOENZYMES

## 2012-01-28 NOTE — Progress Notes (Signed)
  Echocardiogram 2D Echocardiogram has been performed.  Gresia Isidoro 01/28/2012, 10:26 AM

## 2012-01-28 NOTE — Progress Notes (Signed)
Subjective: Negative cardiac enzymes--no chest pressure, no SOB, no dizziness or nausea  Objective: Vital signs in last 24 hours: Temp:  [98.1 F (36.7 C)-98.3 F (36.8 C)] 98.1 F (36.7 C) (12/15 0500) Pulse Rate:  [61-78] 61  (12/15 0500) Resp:  [16-20] 18  (12/15 0500) BP: (143-187)/(53-80) 143/53 mmHg (12/15 0500) SpO2:  [93 %-98 %] 94 % (12/15 0500) Weight:  [38.2 kg (84 lb 3.5 oz)-38.238 kg (84 lb 4.8 oz)] 38.2 kg (84 lb 3.5 oz) (12/15 0500) Weight change:    Intake/Output from previous day: not documented   Intake/Output this shift:    PE:  General:alert and oriented, brighter than yesterday. Heart:S1S2 RRR soft systolic murmur Lungs:few crackles  Abd:+ BS, soft, non tender Ext:no edema   Lab Results:  Basename 01/28/12 0054 01/27/12 1226  WBC 6.8 8.8  HGB 10.2* 11.3*  HCT 30.1* 33.2*  PLT 206 211   BMET  Basename 01/28/12 0054 01/27/12 1226  NA 137 133*  K 3.5 3.9  CL 101 96  CO2 30 25  GLUCOSE 146* 156*  BUN 10 10  CREATININE 0.70 0.58  CALCIUM 9.2 9.0    Basename 01/28/12 0056 01/27/12 1923  TROPONINI <0.30 <0.30    Lab Results  Component Value Date   CHOL 207* 12/07/2011   HDL 37* 12/07/2011   LDLCALC 115* 12/07/2011   TRIG 276* 12/07/2011   CHOLHDL 5.6 12/07/2011   Lab Results  Component Value Date   HGBA1C 7.0* 12/06/2011     Lab Results  Component Value Date   TSH 1.764 12/06/2011    Hepatic Function Panel  Basename 01/28/12 0054  PROT 4.9*  ALBUMIN 2.5*  AST 14  ALT 10  ALKPHOS 41  BILITOT 0.7  BILIDIR --  IBILI --      Studies/Results: Abd 1 View (kub)  01/27/2012  *RADIOLOGY REPORT*  Clinical Data: Nausea and vomiting  ABDOMEN - 1 VIEW  Comparison: None.  Findings: Bowel gas pattern is nonobstructive.  There are surgical clips of the right upper quadrant.  There are moderate degenerate changes of the spine and mild symmetric degenerative changes of the hips and sacroiliac joints as well as of the symphysis pubis  joint.  IMPRESSION: Nonobstructive bowel gas pattern.   Original Report Authenticated By: Elberta Fortis, M.D.    Ct Head Wo Contrast  01/27/2012  *RADIOLOGY REPORT*  Clinical Data:  Syncopal episode.  Fall.  Head trauma.  Nausea vomiting.  Dizziness.  Neck pain.  CT HEAD WITHOUT CONTRAST CT CERVICAL SPINE WITHOUT CONTRAST  Technique:  Multidetector CT imaging of the head and cervical spine was performed following the standard protocol without intravenous contrast.  Multiplanar CT image reconstructions of the cervical spine were also generated.  Comparison:   None  CT HEAD  Findings: There is no evidence of intracranial hemorrhage, brain edema or other signs of acute infarction.  There is no evidence of intracranial mass lesion or mass effect.  No abnormal extra-axial fluid collections are identified.  Mild chronic small vessel disease is demonstrated with old bilateral basal ganglia lacuna.  No evidence of hydrocephalus.  No evidence of skull fracture or other significant bone abnormality.  IMPRESSION:  1.  No acute intracranial findings. 2.  Chronic small vessel disease and old bilateral basal ganglia lacunes.  CT CERVICAL SPINE  Findings: No evidence of acute cervical spine fracture or subluxation.  Mild degenerative disc disease is multiple cervical levels.  Advanced facet DJD is seen bilaterally at most cervical levels.  There  is also arthropathy with associated pain mass involving the atlantoaxial articulation.  IMPRESSION:  1.  No evidence of acute cervical spine fracture or subluxation. 2.  Cervical spondylosis.   Original Report Authenticated By: Myles Rosenthal, M.D.    Ct Cervical Spine Wo Contrast  01/27/2012  *RADIOLOGY REPORT*  Clinical Data:  Syncopal episode.  Fall.  Head trauma.  Nausea vomiting.  Dizziness.  Neck pain.  CT HEAD WITHOUT CONTRAST CT CERVICAL SPINE WITHOUT CONTRAST  Technique:  Multidetector CT imaging of the head and cervical spine was performed following the standard protocol  without intravenous contrast.  Multiplanar CT image reconstructions of the cervical spine were also generated.  Comparison:   None  CT HEAD  Findings: There is no evidence of intracranial hemorrhage, brain edema or other signs of acute infarction.  There is no evidence of intracranial mass lesion or mass effect.  No abnormal extra-axial fluid collections are identified.  Mild chronic small vessel disease is demonstrated with old bilateral basal ganglia lacuna.  No evidence of hydrocephalus.  No evidence of skull fracture or other significant bone abnormality.  IMPRESSION:  1.  No acute intracranial findings. 2.  Chronic small vessel disease and old bilateral basal ganglia lacunes.  CT CERVICAL SPINE  Findings: No evidence of acute cervical spine fracture or subluxation.  Mild degenerative disc disease is multiple cervical levels.  Advanced facet DJD is seen bilaterally at most cervical levels.  There is also arthropathy with associated pain mass involving the atlantoaxial articulation.  IMPRESSION:  1.  No evidence of acute cervical spine fracture or subluxation. 2.  Cervical spondylosis.   Original Report Authenticated By: Myles Rosenthal, M.D.    Dg Chest Port 1 View  01/27/2012  *RADIOLOGY REPORT*  Clinical Data: Loss of consciousness  PORTABLE CHEST - 1 VIEW  Comparison: Brain MRI 05/06/2010  Findings: Heart silhouette is enlarged.  Aorta is ectatic.  No effusion, infiltrate, or pneumothorax.  Chronic bronchitic changes are noted.  Small volume of fluid along the right horizontal fissure.  There is a potential nodule in the right perihilar lung measuring 11 mm.  IMPRESSION:  1.  Hyperinflated lungs and small left effusion. 2.  Potential perihilar nodule versus vascular shadow on the right. No comparison available.  Consider CT thorax with contrast for evaluation versus follow-up chest radiograph.   Original Report Authenticated By: Genevive Bi, M.D.     Medications: I have reviewed the patient's current  medications.    Marland Kitchen aspirin  81 mg Oral Daily  . atorvastatin  20 mg Oral q1800  . insulin aspart  0-5 Units Subcutaneous QHS  . insulin aspart  0-9 Units Subcutaneous TID WC  . isosorbide mononitrate  60 mg Oral Daily  . metoprolol tartrate  37.5 mg Oral BID  . multivitamin with minerals  1 tablet Oral Daily  . omega-3 acid ethyl esters  1 g Oral BID  . pantoprazole  40 mg Oral Daily  . ranolazine  1,000 mg Oral BID   Assessment/Plan: Principal Problem:  *Syncope Active Problems:  HTN (hypertension)  CAD (coronary artery disease), severe single vessel LAD disease, nl. EF  Nausea and vomiting  PLAN: no complaints today, feeling better, brighter affect.  Afebrile, neg MI.  Awaiting orthostatic BP results.  LOS: 1 day   INGOLD,LAURA R 01/28/2012, 8:37 AM   I have seen and examined the patient along with [facility] R, NP.  I have reviewed the chart, notes and new data.  I agree with NP's note.  Key new complaints: asymptomatic (at least at rest) Key examination changes: no arrhythmia recorded overnight, BP is high in sitting position - not yet checked standing up Key new findings / data: repeat enzymes pending; note relatively low glucose level last night - maybe we should include transient hypoglycemia in the differential diagnosis as well  PLAN: For now she is doing well. Will reassess after ambulation.  Thurmon Fair, MD, [facility] [phone] 01/28/2012, 8:56 AM

## 2012-01-28 NOTE — Progress Notes (Signed)
Physical Therapy Brief Note  SATURATION QUALIFICATIONS: (This note is used to comply with regulatory documentation for home oxygen)  Patient Saturations on Room Air at Rest = 90%  Patient Saturations on Room Air while Ambulating = 85% (observed lowest)  Patient Saturations on 2 Liters of oxygen while Ambulating = 94%  Please briefly explain why patient needs home oxygen: Pt's O2 sats decreased to unacceptable levels while ambulating on Room Air   Please see also full PT Evaluation to follow Thanks,  Van Clines, [place] [phone]

## 2012-01-28 NOTE — Progress Notes (Signed)
Subjective: Admitted yesterday with N/V/Dizzy/Syncope. She is markedly better this am. No CP or SOB today Appreciate Cards input.  Objective: Vital signs in last 24 hours: Temp:  [98.1 F (36.7 C)-98.3 F (36.8 C)] 98.1 F (36.7 C) (12/15 0500) Pulse Rate:  [61-78] 61  (12/15 0500) Resp:  [16-20] 18  (12/15 0500) BP: (143-187)/(53-80) 143/53 mmHg (12/15 0500) SpO2:  [93 %-98 %] 94 % (12/15 0500) Weight:  [38.2 kg (84 lb 3.5 oz)-38.238 kg (84 lb 4.8 oz)] 38.2 kg (84 lb 3.5 oz) (12/15 0500) Weight change:     CBG (last 3)   Basename 01/28/12 0724 01/27/12 2243 01/27/12 2144  GLUCAP 104* 128* 67*    Intake/Output from previous day:  Intake/Output Summary (Last 24 hours) at 01/28/12 0925 Last data filed at 01/27/12 1700  Gross per 24 hour  Intake      0 ml  Output      0 ml  Net      0 ml       Physical Exam General appearance: old and worn out. Thin and frail but looks better than last night. Head: posterior hematoma CDI.  Neck: no adenopathy, no carotid bruit   JVD less this am Resp: CTA  Cardio: Reg, m  GI: soft, non-tender; bowel sounds normal; no masses, no organomegaly  Extremities: Min E  Pulses: 2+ and symmetric  Lymph nodes: no cervical lymphadenopathy  Neurologic: Alert and oriented X 3, weak appearing.    Lab Results:  [facility] 01/28/12 0054 01/27/12 1226  NA 137 133*  K 3.5 3.9  CL 101 96  CO2 30 25  GLUCOSE 146* 156*  BUN 10 10  CREATININE 0.70 0.58  CALCIUM 9.2 9.0  MG -- --  PHOS -- --     Basename 01/28/12 0054 01/27/12 1226  AST 14 18  ALT 10 12  ALKPHOS 41 52  BILITOT 0.7 1.2  PROT 4.9* 5.8*  ALBUMIN 2.5* 3.0*     Basename 01/28/12 0054 01/27/12 1226  WBC 6.8 8.8  NEUTROABS -- 8.1*  HGB 10.2* 11.3*  HCT 30.1* 33.2*  MCV 86.2 86.5  PLT 206 211    Lab Results  Component Value Date   INR 0.97 01/27/2012   INR 1.06 12/07/2011   INR 1.00 12/06/2011     Basename 01/28/12 0820 01/28/12 0056 01/28/12 0054 01/27/12  1923  CKTOTAL -- 31 THIS TEST WAS ORDERED IN ERROR AND HAS BEEN CREDITED. --  CKMB -- 2.5 THIS TEST WAS ORDERED IN ERROR AND HAS BEEN CREDITED. --  CKMBINDEX -- -- -- --  TROPONINI <0.30 <0.30 -- <0.30    No results found for this basename: TSH,T4TOTAL,FREET3,T3FREE,THYROIDAB in the last 72 hours  No results found for this basename: VITAMINB12:2,FOLATE:2,FERRITIN:2,TIBC:2,IRON:2,RETICCTPCT:2 in the last 72 hours  Micro Results: No results found for this or any previous visit (from the past 240 hour(s)).   Studies/Results: Abd 1 View (kub)  01/27/2012  *RADIOLOGY REPORT*  Clinical Data: Nausea and vomiting  ABDOMEN - 1 VIEW  Comparison: None.  Findings: Bowel gas pattern is nonobstructive.  There are surgical clips of the right upper quadrant.  There are moderate degenerate changes of the spine and mild symmetric degenerative changes of the hips and sacroiliac joints as well as of the symphysis pubis joint.  IMPRESSION: Nonobstructive bowel gas pattern.   Original Report Authenticated By: Elberta Fortis, M.D.    Ct Head Wo Contrast  01/27/2012  *RADIOLOGY REPORT*  Clinical Data:  Syncopal episode.  Fall.  Head trauma.  Nausea vomiting.  Dizziness.  Neck pain.  CT HEAD WITHOUT CONTRAST CT CERVICAL SPINE WITHOUT CONTRAST  Technique:  Multidetector CT imaging of the head and cervical spine was performed following the standard protocol without intravenous contrast.  Multiplanar CT image reconstructions of the cervical spine were also generated.  Comparison:   None  CT HEAD  Findings: There is no evidence of intracranial hemorrhage, brain edema or other signs of acute infarction.  There is no evidence of intracranial mass lesion or mass effect.  No abnormal extra-axial fluid collections are identified.  Mild chronic small vessel disease is demonstrated with old bilateral basal ganglia lacuna.  No evidence of hydrocephalus.  No evidence of skull fracture or other significant bone abnormality.   IMPRESSION:  1.  No acute intracranial findings. 2.  Chronic small vessel disease and old bilateral basal ganglia lacunes.  CT CERVICAL SPINE  Findings: No evidence of acute cervical spine fracture or subluxation.  Mild degenerative disc disease is multiple cervical levels.  Advanced facet DJD is seen bilaterally at most cervical levels.  There is also arthropathy with associated pain mass involving the atlantoaxial articulation.  IMPRESSION:  1.  No evidence of acute cervical spine fracture or subluxation. 2.  Cervical spondylosis.   Original Report Authenticated By: Myles Rosenthal, M.D.    Ct Cervical Spine Wo Contrast  01/27/2012  *RADIOLOGY REPORT*  Clinical Data:  Syncopal episode.  Fall.  Head trauma.  Nausea vomiting.  Dizziness.  Neck pain.  CT HEAD WITHOUT CONTRAST CT CERVICAL SPINE WITHOUT CONTRAST  Technique:  Multidetector CT imaging of the head and cervical spine was performed following the standard protocol without intravenous contrast.  Multiplanar CT image reconstructions of the cervical spine were also generated.  Comparison:   None  CT HEAD  Findings: There is no evidence of intracranial hemorrhage, brain edema or other signs of acute infarction.  There is no evidence of intracranial mass lesion or mass effect.  No abnormal extra-axial fluid collections are identified.  Mild chronic small vessel disease is demonstrated with old bilateral basal ganglia lacuna.  No evidence of hydrocephalus.  No evidence of skull fracture or other significant bone abnormality.  IMPRESSION:  1.  No acute intracranial findings. 2.  Chronic small vessel disease and old bilateral basal ganglia lacunes.  CT CERVICAL SPINE  Findings: No evidence of acute cervical spine fracture or subluxation.  Mild degenerative disc disease is multiple cervical levels.  Advanced facet DJD is seen bilaterally at most cervical levels.  There is also arthropathy with associated pain mass involving the atlantoaxial articulation.  IMPRESSION:   1.  No evidence of acute cervical spine fracture or subluxation. 2.  Cervical spondylosis.   Original Report Authenticated By: Myles Rosenthal, M.D.    Dg Chest Port 1 View  01/28/2012  *RADIOLOGY REPORT*  Clinical Data: Follow-up nodule  PORTABLE CHEST - 1 VIEW  Comparison: Chest radiograph 01/27/2012  Findings: Stable mildly enlarged heart silhouette.  Lungs are hyperinflated.  The nodule of concern on comparison exam likely represents a vascular shadow.  There is chronic bronchitic markings bilaterally.  Small left effusion again noted  IMPRESSION: . 1.  Right hilar nodule likely represents a vascular shadow. 2.  Hyperinflated lungs with chronic bronchitic changes small left effusion.   Original Report Authenticated By: Genevive Bi, M.D.    Dg Chest Port 1 View  01/27/2012  *RADIOLOGY REPORT*  Clinical Data: Loss of consciousness  PORTABLE CHEST - 1 VIEW  Comparison: Brain MRI  05/06/2010  Findings: Heart silhouette is enlarged.  Aorta is ectatic.  No effusion, infiltrate, or pneumothorax.  Chronic bronchitic changes are noted.  Small volume of fluid along the right horizontal fissure.  There is a potential nodule in the right perihilar lung measuring 11 mm.  IMPRESSION:  1.  Hyperinflated lungs and small left effusion. 2.  Potential perihilar nodule versus vascular shadow on the right. No comparison available.  Consider CT thorax with contrast for evaluation versus follow-up chest radiograph.   Original Report Authenticated By: Genevive Bi, M.D.      Medications: Scheduled:   . aspirin  81 mg Oral Daily  . atorvastatin  20 mg Oral q1800  . insulin aspart  0-5 Units Subcutaneous QHS  . insulin aspart  0-9 Units Subcutaneous TID WC  . isosorbide mononitrate  60 mg Oral Daily  . metoprolol tartrate  37.5 mg Oral BID  . multivitamin with minerals  1 tablet Oral Daily  . omega-3 acid ethyl esters  1 g Oral BID  . pantoprazole  40 mg Oral Daily  . ranolazine  1,000 mg Oral BID   Continuous:    . sodium chloride       Assessment/Plan: Principal Problem:  *Syncope Active Problems:  HTN (hypertension)  CAD (coronary artery disease), severe single vessel LAD disease, nl. EF  Nausea and vomiting  Syncope with N/V/Dizzy and possible low grade fever yesterday. This may be possible Viral Gastroenteritis vrs Orthostasis vrs angina vrs Hypoglycemia. CBG of 67 last night could have caused her Sxs as well.  I have already taken her off her Sulfonurea and metformin.   Await orthostatics.  S/P Rx c  some zofran and min fluids. If Cardiac induced Syncope there are only poor options as CABG would be difficult. No Arrhythmias overnight. Get up today with PT. EF was 65% on cath. ECHO Ordered and KUB neg.  BNP = 3020  Severe Single vessel CAD with recent Cath showing 100% LAD not amenable to PCI and complicated by Pseudoaneurysm requiring procedure.   Falls - PT/OT/SW   CKDz 2 - Cr 0.58 - 0.7 and CC fine. Follow  DM2 - ISS, Hold Metformin/Sulfonurea.  Memory loss - stable and not appreciated on todays exam  HTN - better this am watch for orthostasis Lipids - on Rx - Dr Tresa Endo just lowered dose.  GERD - PPI  Poss Pulm Nodule on CXR = Potential perihilar nodule versus vascular shadow on the right. No comparison available. Consider CT thorax with contrast for evaluation versus follow-up chest radiograph. Repeat CXR  Showed 1.  Right hilar nodule likely represents a vascular shadow. 2.  Hyperinflated lungs with chronic bronchitic changes small left effusion.   Min Anemia - Hbg 11.3 to 10.2 with the gentle hydration. Hold Iron as she c/o severe constipation.  Hyponatremia - Min = 133.  137 today and better DVT proph  Consider AL  Consider DNR    DM2 -  Basename 01/28/12 0724 01/27/12 2243 01/27/12 2144  GLUCAP 104* 128* 67*    LOS: 1 day   Gabryella Murfin M 01/28/2012, 9:25 AM

## 2012-01-28 NOTE — Progress Notes (Signed)
Physical Therapy Evaluation Patient Details Name: Julia [name] MRN: [id number] DOB: [birth year] Today's Date: 01/28/2012 Time: 4098-1191 PT Time Calculation (min): 20 min  PT Assessment / Plan / Recommendation Clinical Impression  78 yo female admitted with syncope presetns to PT with decr functional mobility; will benefit from PT to maximize independence and safety with mobility and enable pt to safely dc home; Of note, pt is not normally on supplemental O2 and did desat to 85% with amb on Room Air -- perhaps her syncope is related to this    PT Assessment  Patient needs continued PT services    Follow Up Recommendations  Home health PT;Supervision - Intermittent    Does the patient have the potential to tolerate intense rehabilitation      Barriers to Discharge        Equipment Recommendations  Rolling walker with 5" wheels (3in1) Supplemental O2   Recommendations for Other Services OT consult   Frequency Min 3X/week    Precautions / Restrictions Precautions Precautions: Fall;Other (comment) (watch O2 sats) Precaution Comments: desats on Room Air   Pertinent Vitals/Pain Please see previous note for O2 saturations with activity      Mobility  Bed Mobility Bed Mobility: Supine to Sit;Sitting - Scoot to Edge of Bed Supine to Sit: 4: Min guard;With rails (without physical contact) Sitting - Scoot to Edge of Bed: 5: Supervision;With rail Details for Bed Mobility Assistance: Inefficent movement, but not requiring phsyical assist Transfers Transfers: Sit to Stand;Stand to Sit Sit to Stand: 3: Mod assist;With armrests Stand to Sit: 4: Min assist;With armrests Details for Transfer Assistance: Cues fro hand placement and safety; Required anti-gravity lift assist with sit to stand, due to weakness; able to control descentto sit well with armrests Ambulation/Gait Ambulation/Gait Assistance: 4: Min assist Ambulation Distance (Feet): 65 Feet Assistive device: Rolling  walker Ambulation/Gait Assistance Details: Cues for safety, posture, and to self-monitor for activity tol; See previous note for O2 sats Gait Pattern: Step-through pattern Gait velocity: slowed    Shoulder Instructions     Exercises     PT Diagnosis: Difficulty walking;Generalized weakness  PT Problem List: Decreased strength;Decreased activity tolerance;Decreased balance;Decreased mobility;Decreased knowledge of use of DME;Cardiopulmonary status limiting activity PT Treatment Interventions: DME instruction;Gait training;Stair training;Functional mobility training;Therapeutic activities;Therapeutic exercise;Patient/family education   PT Goals Acute Rehab PT Goals PT Goal Formulation: With patient Time For Goal Achievement: 02/11/12 Potential to Achieve Goals: Good Pt will go Supine/Side to Sit: with modified independence PT Goal: Supine/Side to Sit - Progress: Goal set today Pt will go Sit to Supine/Side: with modified independence PT Goal: Sit to Supine/Side - Progress: Goal set today Pt will go Sit to Stand: with modified independence PT Goal: Sit to Stand - Progress: Goal set today Pt will go Stand to Sit: with modified independence PT Goal: Stand to Sit - Progress: Goal set today Pt will Transfer Bed to Chair/Chair to Bed: with modified independence PT Transfer Goal: Bed to Chair/Chair to Bed - Progress: Goal set today Pt will Ambulate: >150 feet;with modified independence;with least restrictive assistive device;with rolling walker PT Goal: Ambulate - Progress: Goal set today Pt will Go Up / Down Stairs: 3-5 stairs;with modified independence;with rail(s) PT Goal: Up/Down Stairs - Progress: Goal set today  Visit Information  Last PT Received On: 01/28/12 Assistance Needed: +1    Subjective Data  Subjective: agreeable to amb Patient Stated Goal: Home   Prior Functioning  Home Living Lives With: Family;Son Available Help at Discharge: Family;Available  PRN/intermittently  Type of Home: House Home Access: Stairs to enter [employer] of Steps: 3 (must be verified) Entrance Stairs-Rails: None Home Layout: One level Home Adaptive Equipment: None Prior Function Level of Independence: Independent Able to Take Stairs?: Yes Communication Communication: No difficulties    Cognition  Overall Cognitive Status: Appears within functional limits for tasks assessed/performed Arousal/Alertness: Awake/alert Orientation Level: Appears intact for tasks assessed Behavior During Session: [facility] for tasks performed    Extremity/Trunk Assessment Right Upper Extremity Assessment RUE ROM/Strength/Tone: [facility] for tasks assessed Left Upper Extremity Assessment LUE ROM/Strength/Tone: WFL for tasks assessed Right Lower Extremity Assessment RLE ROM/Strength/Tone: Deficits RLE ROM/Strength/Tone Deficits: Generally weak; dependent on UE support in standing Left Lower Extremity Assessment LLE ROM/Strength/Tone: Deficits LLE ROM/Strength/Tone Deficits: Generally weak, dependent on UE support in standing   Balance    End of Session PT - End of Session Equipment Utilized During Treatment: Oxygen Activity Tolerance: Patient tolerated treatment well Patient left: in chair;with call bell/phone within reach;with family/visitor present Nurse Communication: Mobility status;Other (comment) (O2 sats)  GP     Van Clines [facility] [place], [place] [phone]'  01/28/2012, 3:30 PM

## 2012-01-28 NOTE — Progress Notes (Signed)
SAO2 96% on 2L O2 per N/C. Cannula removed . 1130- recheck SAO2 on R/A 86% O2 reapplied at 2L back up to 95% pt reports much easier to breathe since she has been in the hospital.

## 2012-01-29 LAB — GLUCOSE, CAPILLARY: Glucose-Capillary: 288 mg/dL — ABNORMAL HIGH (ref 70–99)

## 2012-01-29 MED ORDER — BOOST / RESOURCE BREEZE PO LIQD: 1.0000 | Freq: Three times a day (TID) | ORAL | Status: DC

## 2012-01-29 MED ORDER — ONDANSETRON HCL 4 MG PO TABS: 4.0000 mg | ORAL_TABLET | Freq: Four times a day (QID) | ORAL | Status: DC | PRN

## 2012-01-29 NOTE — Discharge Summary (Signed)
DISCHARGE SUMMARY  Julia [name]  MR#: [id number]  DOB:12-01-1927  Date of Admission: 01/27/2012 Date of Discharge: 01/29/2012  Attending Physician:Treazure Nery A  Patient's WUJ:WJXBJYN,WGNFAOZ A, MD  Consults:Treatment Team:  Thurmon Fair, MD  Discharge Diagnoses: Principal Problem:  *Syncope Active Problems:  HTN (hypertension)  CAD (coronary artery disease), severe single vessel LAD disease, nl. 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      atorvastatin 40 MG tablet   Commonly known as: LIPITOR   Take 20 mg by mouth every evening.      ferrous sulfate 325 (65 FE) MG tablet   Take 1 tablet (325 mg total) by mouth 3 (three) times daily with meals.      fish oil-omega-3 fatty acids 1000 MG capsule   Take 1 g by mouth 2 (two) times daily.      isosorbide mononitrate 30 MG 24 hr tablet   Commonly known as: IMDUR   Take 60 mg by mouth daily.      metFORMIN 500 MG tablet   Commonly known as: GLUCOPHAGE   Take 500 mg by mouth 2 (two) times daily with a meal.      metoprolol tartrate 25 MG tablet   Commonly known as: LOPRESSOR   Take 37.5 mg by mouth 2 (two) times daily.      multivitamin with minerals Tabs   Take 1 tablet by mouth daily.      nitroGLYCERIN 0.4 MG SL tablet   Commonly known as: NITROSTAT   Place 0.4 mg under the tongue every 5 (five) minutes as needed. For chest pain      omega-3 acid ethyl esters 1 G capsule   Commonly known as: LOVAZA   Take 1 capsule (1 g total) by mouth 2 (two) times daily.      omeprazole  20 MG capsule   Commonly known as: PRILOSEC   Take 20 mg by mouth daily.      ondansetron 4 MG tablet   Commonly known as: ZOFRAN   Take 1 tablet (4 mg total) by mouth every 6 (six) hours as needed for nausea.      ranolazine 500 MG 12 hr tablet   Commonly known as: RANEXA   Take 1,000 mg by mouth 2 (two) times daily.        Hospital Procedures: Abd 1 View (kub)  01/27/2012  *RADIOLOGY REPORT*  Clinical Data: Nausea and vomiting  ABDOMEN - 1 VIEW  Comparison: None.  Findings: Bowel gas pattern is nonobstructive.  There are surgical clips of the right upper quadrant.  There are moderate degenerate changes of the spine and mild symmetric degenerative changes of the hips and sacroiliac joints as well as of the symphysis pubis joint.  IMPRESSION: Nonobstructive bowel gas pattern.   Original Report Authenticated By: Elberta Fortis, M.D.    Ct Head Wo Contrast  01/27/2012  *RADIOLOGY REPORT*  Clinical Data:  Syncopal episode.  Fall.  Head trauma.  Nausea vomiting.  Dizziness.  Neck pain.  CT HEAD WITHOUT CONTRAST CT CERVICAL SPINE WITHOUT CONTRAST  Technique:  Multidetector CT imaging of the head and cervical spine was performed following the standard protocol without intravenous contrast.  Multiplanar CT image reconstructions of the cervical spine were also generated.  Comparison:   None  CT HEAD  Findings: There is no evidence of intracranial hemorrhage, brain edema or other signs of acute infarction.  There is no evidence of intracranial mass lesion or mass effect.  No abnormal extra-axial fluid collections are identified.  Mild chronic small vessel disease is demonstrated with old bilateral basal ganglia lacuna.  No evidence of hydrocephalus.  No evidence of skull fracture or other significant bone abnormality.  IMPRESSION:  1.  No acute intracranial findings. 2.  Chronic small vessel disease and old bilateral basal ganglia lacunes.  CT CERVICAL SPINE  Findings: No evidence of acute cervical spine  fracture or subluxation.  Mild degenerative disc disease is multiple cervical levels.  Advanced facet DJD is seen bilaterally at most cervical levels.  There is also arthropathy with associated pain mass involving the atlantoaxial articulation.  IMPRESSION:  1.  No evidence of acute cervical spine fracture or subluxation. 2.  Cervical spondylosis.   Original Report Authenticated By: Myles Rosenthal, M.D.    Ct Cervical Spine Wo Contrast  01/27/2012  *RADIOLOGY REPORT*  Clinical Data:  Syncopal episode.  Fall.  Head trauma.  Nausea vomiting.  Dizziness.  Neck pain.  CT HEAD WITHOUT CONTRAST CT CERVICAL SPINE WITHOUT CONTRAST  Technique:  Multidetector CT imaging of the head and cervical spine was performed following the standard protocol without intravenous contrast.  Multiplanar CT image reconstructions of the cervical spine were also generated.  Comparison:   None  CT HEAD  Findings: There is no evidence of intracranial hemorrhage, brain edema or other signs of acute infarction.  There is no evidence of intracranial mass lesion or mass effect.  No abnormal extra-axial fluid collections are identified.  Mild chronic small vessel disease is demonstrated with old bilateral basal ganglia lacuna.  No evidence of hydrocephalus.  No evidence of skull fracture or other significant bone abnormality.  IMPRESSION:  1.  No acute intracranial findings. 2.  Chronic small vessel disease and old bilateral basal ganglia lacunes.  CT CERVICAL SPINE  Findings: No evidence of acute cervical spine fracture or subluxation.  Mild degenerative disc disease is multiple cervical levels.  Advanced facet DJD is seen bilaterally at most cervical levels.  There is also arthropathy with associated pain mass involving the atlantoaxial articulation.  IMPRESSION:  1.  No evidence of acute cervical spine fracture or subluxation. 2.  Cervical spondylosis.   Original Report Authenticated By: Myles Rosenthal, M.D.    Dg Chest Port 1 View  01/28/2012   *RADIOLOGY REPORT*  Clinical Data: Follow-up nodule  PORTABLE CHEST - 1 VIEW  Comparison: Chest radiograph 01/27/2012  Findings: Stable mildly enlarged heart silhouette.  Lungs are hyperinflated.  The nodule of concern on comparison exam likely represents a vascular shadow.  There is chronic bronchitic markings bilaterally.  Small left effusion again noted  IMPRESSION: . 1.  Right hilar nodule likely represents a vascular shadow. 2.  Hyperinflated lungs with chronic bronchitic changes small left effusion.   Original Report Authenticated By: Genevive Bi, M.D.    Dg Chest Port 1 View  01/27/2012  *RADIOLOGY REPORT*  Clinical Data: Loss of consciousness  PORTABLE CHEST - 1 VIEW  Comparison: Brain MRI 05/06/2010  Findings:  Heart silhouette is enlarged.  Aorta is ectatic.  No effusion, infiltrate, or pneumothorax.  Chronic bronchitic changes are noted.  Small volume of fluid along the right horizontal fissure.  There is a potential nodule in the right perihilar lung measuring 11 mm.  IMPRESSION:  1.  Hyperinflated lungs and small left effusion. 2.  Potential perihilar nodule versus vascular shadow on the right. No comparison available.  Consider CT thorax with contrast for evaluation versus follow-up chest radiograph.   Original Report Authenticated By: Genevive Bi, M.D.     History of Present Illness: 26 F with know CAD presented to ED after son convinced her to come 12 hrs after her "event"  Julia [name] reports getting dizzy last night and "blacking out - Syncope". Julia [name] also had N/V on awakening with sweats and son said her head felt febrile. Julia [name] with a hx of vertigo and is chronic on/off dizzy. Julia [name] hit the back of her head, hematoma noted. Julia [name] is alert and oriented with no neuro deficits. Julia [name] reports seeing Dr Tresa Endo on Wednesday who increased Renexa to 1000 BID, Isosrbide 60 Daily and cutting Lipitor to 20 mg daily. Julia [name] with noted drop in O2 sats while talking - Julia [name] reports for several months now Julia [name] has got sob  with talking and walking. Julia [name] also had CP and SOB last night - Took 2 NTG. In ED W/Up was unremarkable. CCT showed 1. No acute intracranial findings. 2. Chronic small vessel disease and old bilateral basal ganglia  Lacunes. CT Neck showed 1. No evidence of acute cervical spine fracture or subluxation. 2. Cervical spondylosis. CXR showed 1. Hyperinflated lungs and small left effusion. 2. Potential perihilar nodule versus vascular shadow on the right. No comparison available. Consider CT thorax with contrast for evaluation versus follow-up chest radiograph. CMET, CBC and UA unremarkable. Trop I (-)  Recently seen in our office 01/23/2012 for increased swelling and SOB/DOE - Sats were 97 %. At that OV they reviewed Julia [name] recent cardiac cath and unsuccessful attempt at LAD PCI. Julia [name] had complication of pseudoaneurysm removal by Dr. Donneta Romberg. Julia [name] continued to have trouble sleeping since hospitalization including insomnia/can't fall asleep because Julia [name] is very worried. Julia [name] continues to report DOE and CP. Can't get to the mailbox without stopping to breathe. Julia [name] is using nitrogylcerin twice daily. For her angina the goals are RF control, BP <130/80 and remain on max med therapy with close following with Dr Tresa Endo (Julia [name] saw him on Wednesday with changes listed above. For her DOE and Edema - fluid and salt restriction was recommended with LE Elevation - meds were not adjusted. Her BP was terrible at SBP 190 a and Julia [name] was very anxious as well.  Julia [name] will need admit and Cards consultation.  Julia [name] was stood up in ED and Dizzy returned.  Last Vomiting episode occurred at @ 3:30 pm    Hospital Course: Patient was admitted to the telemetry floor for further monitoring.  Her rhythm remained stable from a cardiac standpoint Julia [name] remains stable from a hemodynamic standpoint with no anginal symptoms.  Cardiology did consult.  From a diabetic standpoint Julia [name] did have some borderline blood sugars and her glyburide was  discontinued with good stability blood sugars in the mid 100s.  With regards to her GI symptoms, he's rather sudden in onset on Friday and clearly precipitated the event.  Julia [name] was hydrated through the weekend and as of today was eating, drinking and requesting discharge.  Julia [name] was ambulating short distances without any anginal symptoms.  He is discharged improved and stable condition for further as an outpatient. It should be noted that we acknowledges that Julia [name] has fairly severe cardiac disease but was not a candidate for endovascular revascularization and is being followed closely by cardiology.  Her enzymes were negative repeat echo pending and Julia [name]'ll follow up with cardiology in the outpatient setting.  Day of Discharge Exam BP 152/57  Pulse 62  Temp 97.2 F (36.2 C) (Oral)  Resp 16  Ht 4\' 8"  (1.422 m)  Wt 37.83 kg (83 lb 6.4 oz)  BMI 18.70 kg/m2  SpO2 97%  Physical Exam:  Patient is awake alert sitting up in a chair bright conversant just finished eating lunch.  No JVD or bruits.  Lungs are clear.  Back is kyphotic.  Cardiovascular exam is regular 1/6 systolic ejection murmur.  Abdomen is soft and nontender.  Extremities with trace edema and intact distal pulses.  Neurologically Julia [name] is nonlateralizing and normal. Discharge Labs:  [facility] 01/28/12 0054 01/27/12 1226  NA 137 133*  K 3.5 3.9  CL 101 96  CO2 30 25  GLUCOSE 146* 156*  BUN 10 10  CREATININE 0.70 0.58  CALCIUM 9.2 9.0  MG -- --  PHOS -- --    Basename 01/28/12 0054 01/27/12 1226  AST 14 18  ALT 10 12  ALKPHOS 41 52  BILITOT 0.7 1.2  PROT 4.9* 5.8*  ALBUMIN 2.5* 3.0*    Basename 01/28/12 0054 01/27/12 1226  WBC 6.8 8.8  NEUTROABS -- 8.1*  HGB 10.2* 11.3*  HCT 30.1* 33.2*  MCV 86.2 86.5  PLT 206 211    Basename 01/28/12 0820 01/28/12 0056 01/28/12 0054 01/27/12 1923  CKTOTAL -- 31 THIS TEST WAS ORDERED IN ERROR AND HAS BEEN CREDITED. --  CKMB -- 2.5 THIS TEST WAS ORDERED IN ERROR AND HAS BEEN CREDITED.  --  CKMBINDEX -- -- -- --  TROPONINI <0.30 <0.30 -- <0.30   No results found for this basename: TSH,T4TOTAL,FREET3,T3FREE,THYROIDAB in the last 72 hours No results found for this basename: VITAMINB12:2,FOLATE:2,FERRITIN:2,TIBC:2,IRON:2,RETICCTPCT:2 in the last 72 hours  Discharge instructions:     Discharge Orders    Future Orders Please Complete By Expires   Diet - low sodium heart healthy      Increase activity slowly         Disposition: home with son  Follow-up Appts: Follow-up with Dr. Jacky Kindle at [facility] in *one week  Call for appointment.  Condition on Discharge: *improved and stable condition but guarded prognosis given the severity of her coronary disease to followup with cardiology Tests Needing Follow-up: *cardiology followup in the next 1-2 weeks Signed: Abryana Lykens A 01/29/2012, 5:31 PM

## 2012-01-29 NOTE — Progress Notes (Signed)
Clinical Social Worker received referral for "Help at home with DC".  CSW to sign off, please re consult if needed.  CSW updated RNCM.    Angelia Mould, MSW, [place] [phone]

## 2012-01-29 NOTE — Evaluation (Signed)
Occupational Therapy Evaluation Patient Details Name: Julia [name] MRN: [id number] DOB: [birth year] Today's Date: 01/29/2012 Time: 0900-0930 OT Time Calculation (min): 30 min  OT Assessment / Plan / Recommendation Clinical Impression  Pt admitted with syncopal episode and complicated recent past medical history. Pt presents as generally weak. Also, reports dizziness with transitional movements, although BP stable. Pt will benefit form skilled OT in the acute setting to maximize I with ADL and ADL mobility prior to d/c home with intermittent supervision of son. Unsure if pt has something like [employer] [facility], if not, would highly recommend it.    OT Assessment  Patient needs continued OT Services    Follow Up Recommendations  Home health OT;Supervision/Assistance - 24 hour    Barriers to Discharge      Equipment Recommendations  3 in 1 bedside comode    Recommendations for Other Services    Frequency  Min 2X/week    Precautions / Restrictions Precautions Precautions: Fall Precaution Comments: desats on Room Air   Pertinent Vitals/Pain Pt with no c/o pain during session. 02 dropped to 86% on RA with activity. 96% on 2L [place] 02 at rest.    ADL  Grooming: Wash/dry face;Wash/dry hands;Min guard Where Assessed - Grooming: Supported standing Lower Body Dressing: Min guard Where Assessed - Lower Body Dressing: Supported sit to Pharmacist, hospital: Hydrographic surveyor Method: Sit to Barista: Regular height toilet Toileting - Clothing Manipulation and Hygiene: Minimal assistance Where Assessed - Engineer, mining and Hygiene: Standing Tub/Shower Transfer: Minimal assistance Tub/Shower Transfer Method: Ambulating Equipment Used: Gait belt;Rolling walker Transfers/Ambulation Related to ADLs: Pt with difficulty manipulating RW- did better with HHA throughout room ADL Comments: Pt reports feeling generally weak. Also, pt reports dizziness with  transitional movement (e.g. supine to sit and sit to stand). No nystagmus noted    OT Diagnosis: Generalized weakness  OT Problem List: Decreased strength;Decreased activity tolerance;Impaired balance (sitting and/or standing);Decreased knowledge of use of DME or AE;Decreased knowledge of precautions OT Treatment Interventions: Self-care/ADL training;DME and/or AE instruction;Therapeutic activities;Patient/family education;Balance training   OT Goals Acute Rehab OT Goals OT Goal Formulation: With patient Time For Goal Achievement: 02/12/12 Potential to Achieve Goals: Good ADL Goals Pt Will Perform Grooming: Independently;Standing at sink ADL Goal: Grooming - Progress: Goal set today Pt Will Perform Upper Body Dressing: Independently;Sitting, bed;Sitting, chair ADL Goal: Upper Body Dressing - Progress: Goal set today Pt Will Perform Lower Body Dressing: Independently;Sit to stand from chair;Sit to stand from bed ADL Goal: Lower Body Dressing - Progress: Goal set today Pt Will Transfer to Toilet: with modified independence;Ambulation;with DME ADL Goal: Toilet Transfer - Progress: Goal set today Pt Will Perform Toileting - Clothing Manipulation: Independently;Standing ADL Goal: Toileting - Clothing Manipulation - Progress: Goal set today Pt Will Perform Tub/Shower Transfer: Tub transfer;with modified independence;Ambulation ADL Goal: Tub/Shower Transfer - Progress: Goal set today Additional ADL Goal #1: Pt will respond appropriately to 3/3 safety scenarios in prep for being alone at home. ADL Goal: Additional Goal #1 - Progress: Goal set today  Visit Information  Last OT Received On: 01/29/12 Assistance Needed: +1    Subjective Data  Subjective: I get dizzy when I switch positions Patient Stated Goal: Return home   Prior Functioning     Home Living Lives With: Family;Son Available Help at Discharge: Family;Available PRN/intermittently Type of Home: House Home Access: Stairs to  enter [employer] of Steps: 3 (must be verified) Entrance Stairs-Rails: None Home Layout: One level Bathroom  Shower/Tub: Network engineer: None Prior Function Level of Independence: Independent Able to Take Stairs?: Yes Vocation: Retired Comments: still was doing yardwork up until onset of these symptoms ~26months ago Communication Communication: No difficulties Dominant Hand: Right         Vision/Perception     Cognition  Overall Cognitive Status: Appears within functional limits for tasks assessed/performed Arousal/Alertness: Awake/alert Orientation Level: Appears intact for tasks assessed Behavior During Session: [facility] for tasks performed    Extremity/Trunk Assessment Right Upper Extremity Assessment RUE ROM/Strength/Tone: Deficits RUE ROM/Strength/Tone Deficits: generally weak; grossly 4+/5 with poor endurance Left Upper Extremity Assessment LUE ROM/Strength/Tone: Deficits LUE ROM/Strength/Tone Deficits: generally weak; grossly 4+/5 with poor endurance     Mobility Bed Mobility Supine to Sit: With rails;4: Min guard Details for Bed Mobility Assistance: difficulty coming into sitting but did not require assist Transfers Sit to Stand: 4: Min assist;From bed Stand to Sit: 4: Min guard;With armrests;To chair/3-in-1 Details for Transfer Assistance: Cues for hand placement and safety. Assist to initiate sit to stand and cues to control descent to chair     Shoulder Instructions     Exercise     Balance     End of Session OT - End of Session Equipment Utilized During Treatment: Gait belt Activity Tolerance: Patient tolerated treatment well Patient left: in chair;with call bell/phone within reach Nurse Communication: Mobility status  GO     Julia [name] 01/29/2012, 11:47 AM

## 2012-01-29 NOTE — Progress Notes (Signed)
INITIAL NUTRITION ASSESSMENT  DOCUMENTATION CODES Per approved criteria  -Not Applicable   INTERVENTION:  Resource Breeze supplement 3 times daily with meals (250 kcals, 9 gm protein per 8 fl oz carton) RD to follow for nutrition care plan  NUTRITION DIAGNOSIS: Inadequate oral intake related to limited appetite as evidenced by PO intake 5-25%  Goal: Oral intake with meals & supplements to meet >/= 90% of estimated nutrition needs  Monitor:  PO & supplemental intake, weight, labs, I/O's  Reason for Assessment: Consult, Malnutrition Screening Tool Report  78 y.o. female  Admitting Dx: Syncope  ASSESSMENT: Patient presented to ED after her son convinced her to come 12 hrs after getting dizzy and "blacking out;" also had N/V on awakening with sweats; states "I've never really had a big appetite;" PO intake 5-25% per flowsheet records; reports she's been small framed all her life; observed Resource Breeze supplement on patient's lunch tray -- likes it OK; will add in Order Management.  Height: Ht Readings from Last 1 Encounters:  01/27/12 4\' 8"  (1.422 m)    Weight: Wt Readings from Last 1 Encounters:  01/29/12 83 lb 6.4 oz (37.83 kg)    Ideal Body Weight: 42.4 kg  % Ideal Body Weight: 88%  Wt Readings from Last 10 Encounters:  01/29/12 83 lb 6.4 oz (37.83 kg)  12/12/11 190 lb 14.7 oz (86.6 kg)  12/08/11 85 lb 8.6 oz (38.8 kg)  12/08/11 85 lb 8.6 oz (38.8 kg)  12/08/11 85 lb 8.6 oz (38.8 kg)    Usual Body Weight: 85 lb  % Usual Body Weight: 98%  BMI:  Body mass index is 18.70 kg/(m^2).  Estimated Nutritional Needs: Kcal: 1100-1300 Protein: 50-60 gm Fluid: > 1.5 L  Skin: Intact  Diet Order: Carb Control  EDUCATION NEEDS: -No education needs identified at this time   Intake/Output Summary (Last 24 hours) at 01/29/12 1222 Last data filed at 01/29/12 1100  Gross per 24 hour  Intake    440 ml  Output    200 ml  Net    240 ml    Labs:   Lab  01/28/12 0054 01/27/12 1226  NA 137 133*  K 3.5 3.9  CL 101 96  CO2 30 25  BUN 10 10  CREATININE 0.70 0.58  CALCIUM 9.2 9.0  MG -- --  PHOS -- --  GLUCOSE 146* 156*    CBG (last 3)   Basename 01/29/12 1134 01/29/12 0733 01/28/12 2122  GLUCAP 237* 180* 288*    Scheduled Meds:   . aspirin  81 mg Oral Daily  . atorvastatin  20 mg Oral q1800  . insulin aspart  0-5 Units Subcutaneous QHS  . insulin aspart  0-9 Units Subcutaneous TID WC  . isosorbide mononitrate  60 mg Oral Daily  . metoprolol tartrate  37.5 mg Oral BID  . multivitamin with minerals  1 tablet Oral Daily  . omega-3 acid ethyl esters  1 g Oral BID  . pantoprazole  40 mg Oral Daily  . ranolazine  1,000 mg Oral BID    Continuous Infusions:   . sodium chloride      Past Medical History  Diagnosis Date  . Hypertension   . GERD (gastroesophageal reflux disease)   . Unstable angina 12/06/2011  . Edema leg 12/06/2011  . Hyperlipidemia 12/06/2011  . CAD (coronary artery disease), severe single vessel LAD disease, nl. EF 12/08/2011  . Shortness of breath     "all the time since last Friday" (12/12/2011)  .  Type II diabetes mellitus   . CKD (chronic kidney disease) stage 2, GFR 60-89 ml/min 12/06/2011  . Stroke     "several small strokes"; denies residual  (12/12/2011)  . Arthritis     "all my joints" (12/12/2011)    Past Surgical History  Procedure Date  . Pseudoaneurysm repair 12/12/2011    "no surgery; did ultrasonic; right femoral compression"  . Appendectomy   . Cholecystectomy   . Colectomy     "took out 27 1/2 inches" (12/12/2011)  . Lumbar disc surgery     "2 discs removed, spacer put in" (12/12/2011)  . Carpal tunnel release     bilaterally  . Hammer toe surgery     right  . Abdominal hysterectomy   . Back surgery   . Coronary angioplasty with stent placement 12/08/2011    "failed"  . Cardiac catheterization 12/07/2011    Kirkland Hun, RD, LDN Pager #: [phone] After-Hours  Pager #: [phone]

## 2012-01-29 NOTE — Progress Notes (Addendum)
[facility]  Subjective: Feeling better.  She reports feeling weak in the legs when standing during orthostatic BP check.  It resolved.  No dizziness during the vitals.  Objective: Vital signs in last 24 hours: Temp:  [98.4 F (36.9 C)-98.6 F (37 C)] 98.4 F (36.9 C) (12/16 0600) Pulse Rate:  [55-69] 67  (12/16 0605) Resp:  [12-18] 16  (12/16 0600) BP: (140-197)/(54-68) 158/64 mmHg (12/16 0635) SpO2:  [86 %-98 %] 98 % (12/16 0600) Weight:  [37.83 kg (83 lb 6.4 oz)] 37.83 kg (83 lb 6.4 oz) (12/16 0600) Last BM Date: 01/27/12  Intake/Output from previous day: 12/15 0701 - 12/16 0700 In: 680 [P.O.:680] Out: -  Intake/Output this shift:    Medications Current Facility-Administered Medications  Medication Dose Route Frequency Provider Last Rate Last Dose  . 0.9 %  sodium chloride infusion   Intravenous Continuous Gwen Pounds, MD      . acetaminophen (TYLENOL) tablet 650 mg  650 mg Oral Q6H PRN Gwen Pounds, MD       Or  . acetaminophen (TYLENOL) suppository 650 mg  650 mg Rectal Q6H PRN Gwen Pounds, MD      . aspirin chewable tablet 81 mg  81 mg Oral Daily Gwen Pounds, MD   81 mg at 01/29/12 1005  . atorvastatin (LIPITOR) tablet 20 mg  20 mg Oral q1800 Gwen Pounds, MD   20 mg at 01/28/12 1726  . insulin aspart (novoLOG) injection 0-5 Units  0-5 Units Subcutaneous QHS Gwen Pounds, MD   3 Units at 01/28/12 2204  . insulin aspart (novoLOG) injection 0-9 Units  0-9 Units Subcutaneous TID WC Gwen Pounds, MD   1 Units at 01/28/12 1726  . isosorbide mononitrate (IMDUR) 24 hr tablet 60 mg  60 mg Oral Daily Gwen Pounds, MD   60 mg at 01/29/12 1005  . metoprolol tartrate (LOPRESSOR) tablet 37.5 mg  37.5 mg Oral BID Gwen Pounds, MD   37.5 mg at 01/29/12 1005  . multivitamin with minerals tablet 1 tablet  1 tablet Oral Daily Gwen Pounds, MD   1 tablet at 01/29/12 1005  . nitroGLYCERIN (NITROSTAT) SL tablet 0.4 mg  0.4 mg Sublingual Q5 min PRN Gwen Pounds, MD      . omega-3 acid ethyl esters (LOVAZA) capsule 1 g  1 g Oral BID Minda Meo, MD   1 g at 01/29/12 1005  . ondansetron (ZOFRAN) tablet 4 mg  4 mg Oral Q6H PRN Gwen Pounds, MD       Or  . ondansetron [facility]) injection 4 mg  4 mg Intravenous Q6H PRN Gwen Pounds, MD   4 mg at 01/29/12 0216  . pantoprazole (PROTONIX) EC tablet 40 mg  40 mg Oral Daily Gwen Pounds, MD   40 mg at 01/29/12 1005  . ranolazine (RANEXA) 12 hr tablet 1,000 mg  1,000 mg Oral BID Gwen Pounds, MD   1,000 mg at 01/29/12 1005    PE: General appearance: alert, cooperative and no distress Lungs: clear to auscultation bilaterally Heart: regular rate and rhythm and 1/6 sys MM Extremities: No LEE Pulses: 2+ and symmetric Skin: warm and dry Neurologic: Grossly normal  Lab Results:   Basename 01/28/12 0054 01/27/12 1226  WBC 6.8 8.8  HGB 10.2* 11.3*  HCT 30.1* 33.2*  PLT 206 211   BMET  Basename 01/28/12 0054 01/27/12 1226  NA 137  133*  K 3.5 3.9  CL 101 96  CO2 30 25  GLUCOSE 146* 156*  BUN 10 10  CREATININE 0.70 0.58  CALCIUM 9.2 9.0   PT/INR  Basename 01/27/12 1226  LABPROT 12.8  INR 0.97    Assessment/Plan  Principal Problem:  *Syncope Active Problems:  HTN (hypertension)  CAD (coronary artery disease), severe single vessel LAD disease, nl. EF  Nausea and vomiting  Plan:  She is feeling better without CP.  Some dizziness.  Echo read pending.  It appears SBP dropped 32 mmHg from sitting to standing.  Three minutes later her BP had recovered although, she is hypertensive.  Negative MI.    LOS: 2 days    HAGER, BRYAN 01/29/2012 11:16 AM   Agree with note written by [facility]  Runell Gess 01/29/2012 5:36 PM

## 2012-02-20 ENCOUNTER — Other Ambulatory Visit (HOSPITAL_COMMUNITY): Payer: Self-pay | Admitting: Cardiovascular Disease

## 2012-02-20 DIAGNOSIS — I119 Hypertensive heart disease without heart failure: Secondary | ICD-10-CM

## 2012-03-04 ENCOUNTER — Ambulatory Visit (HOSPITAL_COMMUNITY)
Admission: RE | Admit: 2012-03-04 | Discharge: 2012-03-04 | Disposition: A | Discharge status: Home / Self Care | Payer: Medicare Other | Source: Ambulatory Visit | Attending: Cardiovascular Disease | Admitting: Cardiovascular Disease

## 2012-03-04 DIAGNOSIS — I119 Hypertensive heart disease without heart failure: Secondary | ICD-10-CM | POA: Insufficient documentation

## 2012-03-04 NOTE — Progress Notes (Signed)
Carotid Duplex completed. Julia [name]  

## 2012-03-19 ENCOUNTER — Encounter: Payer: Self-pay | Admitting: Cardiovascular Disease

## 2012-04-30 ENCOUNTER — Encounter: Payer: Self-pay | Admitting: Cardiology

## 2012-05-07 ENCOUNTER — Telehealth: Payer: Self-pay | Admitting: Gastroenterology

## 2012-05-07 NOTE — Telephone Encounter (Signed)
Called Tresa Endo at [facility] to inform her she can see Doug Sou, PA tomorrow at 10am. Abnormal CT shows Gastric Wall Tickening.

## 2012-05-08 ENCOUNTER — Ambulatory Visit: Payer: Medicare Other | Admitting: Gastroenterology

## 2012-05-24 ENCOUNTER — Other Ambulatory Visit (HOSPITAL_COMMUNITY): Payer: Self-pay | Admitting: Cardiovascular Disease

## 2012-05-24 ENCOUNTER — Ambulatory Visit (HOSPITAL_COMMUNITY)
Admission: RE | Admit: 2012-05-24 | Discharge: 2012-05-24 | Disposition: A | Discharge status: Home / Self Care | Payer: Medicare Other | Source: Ambulatory Visit | Attending: Cardiovascular Disease | Admitting: Cardiovascular Disease

## 2012-05-24 DIAGNOSIS — R609 Edema, unspecified: Secondary | ICD-10-CM

## 2012-05-24 DIAGNOSIS — I82409 Acute embolism and thrombosis of unspecified deep veins of unspecified lower extremity: Secondary | ICD-10-CM | POA: Insufficient documentation

## 2012-05-24 DIAGNOSIS — I82402 Acute embolism and thrombosis of unspecified deep veins of left lower extremity: Secondary | ICD-10-CM

## 2012-05-24 NOTE — Progress Notes (Signed)
Venous Duplex Lower Ext. Completed. Julia [name]  

## 2012-05-28 ENCOUNTER — Emergency Department (HOSPITAL_COMMUNITY): Payer: Medicare Other

## 2012-05-28 ENCOUNTER — Inpatient Hospital Stay (HOSPITAL_COMMUNITY)
Admission: EM | Admit: 2012-05-28 | Discharge: 2012-06-03 | DRG: 177 | Disposition: A | Discharge status: Home / Self Care | Payer: Medicare Other | Attending: Internal Medicine | Admitting: Internal Medicine

## 2012-05-28 ENCOUNTER — Encounter (HOSPITAL_COMMUNITY): Payer: Self-pay | Admitting: Emergency Medicine

## 2012-05-28 DIAGNOSIS — R630 Anorexia: Secondary | ICD-10-CM | POA: Diagnosis present

## 2012-05-28 DIAGNOSIS — I129 Hypertensive chronic kidney disease with stage 1 through stage 4 chronic kidney disease, or unspecified chronic kidney disease: Secondary | ICD-10-CM | POA: Diagnosis present

## 2012-05-28 DIAGNOSIS — R627 Adult failure to thrive: Secondary | ICD-10-CM | POA: Diagnosis present

## 2012-05-28 DIAGNOSIS — K219 Gastro-esophageal reflux disease without esophagitis: Secondary | ICD-10-CM | POA: Diagnosis present

## 2012-05-28 DIAGNOSIS — K7682 Hepatic encephalopathy: Secondary | ICD-10-CM | POA: Diagnosis present

## 2012-05-28 DIAGNOSIS — R4182 Altered mental status, unspecified: Secondary | ICD-10-CM

## 2012-05-28 DIAGNOSIS — E46 Unspecified protein-calorie malnutrition: Secondary | ICD-10-CM | POA: Diagnosis present

## 2012-05-28 DIAGNOSIS — Z8673 Personal history of transient ischemic attack (TIA), and cerebral infarction without residual deficits: Secondary | ICD-10-CM

## 2012-05-28 DIAGNOSIS — G9340 Encephalopathy, unspecified: Secondary | ICD-10-CM | POA: Diagnosis present

## 2012-05-28 DIAGNOSIS — J690 Pneumonitis due to inhalation of food and vomit: Principal | ICD-10-CM | POA: Diagnosis present

## 2012-05-28 DIAGNOSIS — K7290 Hepatic failure, unspecified without coma: Secondary | ICD-10-CM | POA: Diagnosis present

## 2012-05-28 DIAGNOSIS — I161 Hypertensive emergency: Secondary | ICD-10-CM

## 2012-05-28 DIAGNOSIS — D72829 Elevated white blood cell count, unspecified: Secondary | ICD-10-CM | POA: Diagnosis present

## 2012-05-28 DIAGNOSIS — I2510 Atherosclerotic heart disease of native coronary artery without angina pectoris: Secondary | ICD-10-CM | POA: Diagnosis present

## 2012-05-28 DIAGNOSIS — J9600 Acute respiratory failure, unspecified whether with hypoxia or hypercapnia: Secondary | ICD-10-CM

## 2012-05-28 DIAGNOSIS — E785 Hyperlipidemia, unspecified: Secondary | ICD-10-CM | POA: Diagnosis present

## 2012-05-28 DIAGNOSIS — Z681 Body mass index (BMI) 19 or less, adult: Secondary | ICD-10-CM

## 2012-05-28 DIAGNOSIS — E876 Hypokalemia: Secondary | ICD-10-CM | POA: Diagnosis present

## 2012-05-28 DIAGNOSIS — J189 Pneumonia, unspecified organism: Secondary | ICD-10-CM

## 2012-05-28 DIAGNOSIS — R739 Hyperglycemia, unspecified: Secondary | ICD-10-CM

## 2012-05-28 DIAGNOSIS — I674 Hypertensive encephalopathy: Secondary | ICD-10-CM | POA: Diagnosis present

## 2012-05-28 DIAGNOSIS — E119 Type 2 diabetes mellitus without complications: Secondary | ICD-10-CM

## 2012-05-28 DIAGNOSIS — N182 Chronic kidney disease, stage 2 (mild): Secondary | ICD-10-CM | POA: Diagnosis present

## 2012-05-28 DIAGNOSIS — I10 Essential (primary) hypertension: Secondary | ICD-10-CM | POA: Diagnosis present

## 2012-05-28 DIAGNOSIS — R55 Syncope and collapse: Secondary | ICD-10-CM

## 2012-05-28 LAB — GLUCOSE, CAPILLARY: Glucose-Capillary: 310 mg/dL — ABNORMAL HIGH (ref 70–99)

## 2012-05-28 LAB — COMPREHENSIVE METABOLIC PANEL
AST: 30 U/L (ref 0–37)
Albumin: 2.8 g/dL — ABNORMAL LOW (ref 3.5–5.2)
CO2: 28 mEq/L (ref 19–32)
Calcium: 10 mg/dL (ref 8.4–10.5)
Chloride: 97 mEq/L (ref 96–112)
Creatinine, Ser: 0.67 mg/dL (ref 0.50–1.10)
Creatinine, Ser: 0.66 mg/dL (ref 0.50–1.10)
GFR calc Af Amer: >90 mL/min (ref >90)
GFR calc non Af Amer: 80 mL/min — ABNORMAL LOW (ref >90)
Glucose, Bld: 274 mg/dL — ABNORMAL HIGH (ref 70–99)
Potassium: 3.7 mEq/L (ref 3.5–5.1)
Sodium: 137 mEq/L (ref 135–145)
Total Bilirubin: 1.5 mg/dL — ABNORMAL HIGH (ref 0.3–1.2)
Total Protein: 6.5 g/dL (ref 6.0–8.3)
Total Protein: 5.8 g/dL — ABNORMAL LOW (ref 6.0–8.3)

## 2012-05-28 LAB — ETHANOL: Alcohol, Ethyl (B): <11 mg/dL (ref 0–11)

## 2012-05-28 LAB — POCT I-STAT, CHEM 8
BUN: 9 mg/dL (ref 6–23)
Calcium, Ion: 1.2 mmol/L (ref 1.13–1.30)
Chloride: 96 mEq/L (ref 96–112)
Creatinine, Ser: 0.8 mg/dL (ref 0.50–1.10)
Glucose, Bld: 271 mg/dL — ABNORMAL HIGH (ref 70–99)
Potassium: 3.5 mEq/L (ref 3.5–5.1)

## 2012-05-28 LAB — CBC
HCT: 39.9 % (ref 36.0–46.0)
MCH: 30.9 pg (ref 26.0–34.0)
MCHC: 36.5 g/dL — ABNORMAL HIGH (ref 30.0–36.0)
MCV: 86.9 fL (ref 78.0–100.0)
MCV: 85.3 fL (ref 78.0–100.0)
Platelets: 430 10*3/uL — ABNORMAL HIGH (ref 150–400)
Platelets: 386 10*3/uL (ref 150–400)
RDW: 14 % (ref 11.5–15.5)
RDW: 12.9 % (ref 11.5–15.5)
WBC: 14.1 10*3/uL — ABNORMAL HIGH (ref 4.0–10.5)
WBC: 20.2 10*3/uL — ABNORMAL HIGH (ref 4.0–10.5)

## 2012-05-28 LAB — PROTIME-INR
Prothrombin Time: 12.9 seconds (ref 11.6–15.2)
Prothrombin Time: 13.5 seconds (ref 11.6–15.2)

## 2012-05-28 LAB — RAPID URINE DRUG SCREEN, HOSP PERFORMED
Benzodiazepines: NOT DETECTED
Cocaine: NOT DETECTED
Opiates: NOT DETECTED

## 2012-05-28 LAB — URINALYSIS, ROUTINE W REFLEX MICROSCOPIC
Bilirubin Urine: NEGATIVE
Glucose, UA: 250 mg/dL — AB
Nitrite: NEGATIVE
Specific Gravity, Urine: 1.022 (ref 1.005–1.030)
pH: 7.5 (ref 5.0–8.0)

## 2012-05-28 LAB — POCT I-STAT TROPONIN I

## 2012-05-28 LAB — DIFFERENTIAL
Basophils Absolute: 0 10*3/uL (ref 0.0–0.1)
Eosinophils Absolute: 0.1 10*3/uL (ref 0.0–0.7)
Eosinophils Relative: 0 % (ref 0–5)
Lymphocytes Relative: 12 % (ref 12–46)
Lymphs Abs: 1.6 10*3/uL (ref 0.7–4.0)
Monocytes Absolute: 0.4 10*3/uL (ref 0.1–1.0)

## 2012-05-28 LAB — MAGNESIUM: Magnesium: 1.6 mg/dL (ref 1.5–2.5)

## 2012-05-28 LAB — LACTIC ACID, PLASMA: Lactic Acid, Venous: 1.4 mmol/L (ref 0.5–2.2)

## 2012-05-28 LAB — PHOSPHORUS: Phosphorus: 4.2 mg/dL (ref 2.3–4.6)

## 2012-05-28 LAB — URINE MICROSCOPIC-ADD ON

## 2012-05-28 MED ORDER — LABETALOL HCL 5 MG/ML IV SOLN: 10.0000 mg | Freq: Once | INTRAVENOUS | Status: DC

## 2012-05-28 MED ORDER — NICARDIPINE HCL IN NACL 20-0.86 MG/200ML-% IV SOLN
5.0000 mg/h | INTRAVENOUS | Status: DC
  Filled 2012-05-28: qty 200

## 2012-05-28 MED ORDER — HEPARIN SODIUM (PORCINE) 5000 UNIT/ML IJ SOLN
5000.0000 [IU] | Freq: Three times a day (TID) | INTRAMUSCULAR | Status: DC
  Administered 2012-05-28 – 2012-06-03 (×18): 5000 [IU] via SUBCUTANEOUS
  Filled 2012-05-28 (×20): qty 1

## 2012-05-28 MED ORDER — ASPIRIN 300 MG RE SUPP: 300.0000 mg | RECTAL | Status: AC

## 2012-05-28 MED ORDER — LORAZEPAM 2 MG/ML IJ SOLN
1.0000 mg | Freq: Once | INTRAMUSCULAR | Status: AC
  Administered 2012-05-28: 2 mg via INTRAVENOUS

## 2012-05-28 MED ORDER — INSULIN ASPART 100 UNIT/ML ~~LOC~~ SOLN
2.0000 [IU] | SUBCUTANEOUS | Status: DC
  Administered 2012-05-28: 10 [IU] via SUBCUTANEOUS
  Administered 2012-05-29: 4 [IU] via SUBCUTANEOUS
  Administered 2012-05-30: 2 [IU] via SUBCUTANEOUS
  Administered 2012-05-31: 4 [IU] via SUBCUTANEOUS
  Administered 2012-05-31 (×2): 2 [IU] via SUBCUTANEOUS
  Administered 2012-06-01: 4 [IU] via SUBCUTANEOUS
  Administered 2012-06-01: 6 [IU] via SUBCUTANEOUS

## 2012-05-28 MED ORDER — SODIUM CHLORIDE 0.9 % IV SOLN: 250.0000 mL | INTRAVENOUS | Status: DC | PRN

## 2012-05-28 MED ORDER — IOHEXOL 350 MG/ML SOLN
50.0000 mL | Freq: Once | INTRAVENOUS | Status: AC | PRN
  Administered 2012-05-28: 50 mL via INTRAVENOUS

## 2012-05-28 MED ORDER — LORAZEPAM 2 MG/ML IJ SOLN
1.0000 mg | Freq: Once | INTRAMUSCULAR | Status: AC
  Administered 2012-05-28: 1 mg via INTRAVENOUS

## 2012-05-28 MED ORDER — LABETALOL HCL 5 MG/ML IV SOLN
10.0000 mg | Freq: Once | INTRAVENOUS | Status: AC
  Administered 2012-05-28: 10 mg via INTRAVENOUS
  Filled 2012-05-28: qty 4

## 2012-05-28 MED ORDER — INSULIN ASPART 100 UNIT/ML ~~LOC~~ SOLN: 1.0000 [IU] | SUBCUTANEOUS | Status: DC | PRN

## 2012-05-28 MED ORDER — SODIUM CHLORIDE 0.9 % IV SOLN
INTRAVENOUS | Status: DC
  Administered 2012-06-02: 18:00:00 via INTRAVENOUS
  Administered 2012-06-02 – 2012-06-03 (×2): 20 mL/h via INTRAVENOUS

## 2012-05-28 MED ORDER — LORAZEPAM 2 MG/ML IJ SOLN
INTRAMUSCULAR | Status: AC
  Administered 2012-05-28: 2 mg via INTRAVENOUS
  Filled 2012-05-28: qty 1

## 2012-05-28 MED ORDER — SODIUM CHLORIDE 0.9 % IV SOLN
1.5000 g | Freq: Once | INTRAVENOUS | Status: DC
  Filled 2012-05-28: qty 1.5

## 2012-05-28 MED ORDER — POTASSIUM CHLORIDE 10 MEQ/100ML IV SOLN
10.0000 meq | INTRAVENOUS | Status: AC
  Administered 2012-05-28 (×4): 10 meq via INTRAVENOUS
  Filled 2012-05-28 (×2): qty 100
  Filled 2012-05-28: qty 200

## 2012-05-28 MED ORDER — SODIUM CHLORIDE 0.9 % IV SOLN
1.5000 g | Freq: Two times a day (BID) | INTRAVENOUS | Status: DC
  Administered 2012-05-29 – 2012-06-02 (×9): 1.5 g via INTRAVENOUS
  Filled 2012-05-28 (×11): qty 1.5

## 2012-05-28 MED ORDER — ASPIRIN 81 MG PO CHEW: 324.0000 mg | CHEWABLE_TABLET | ORAL | Status: AC

## 2012-05-28 MED ORDER — NICARDIPINE HCL IN NACL 20-0.86 MG/200ML-% IV SOLN
5.0000 mg/h | Freq: Once | INTRAVENOUS | Status: AC
  Administered 2012-05-28: 5 mg/h via INTRAVENOUS
  Filled 2012-05-28: qty 200

## 2012-05-28 NOTE — ED Provider Notes (Signed)
Patient seen/examined in the Emergency Department in conjunction with Resident Physician Provider Beverely Pace Patient presents for confusion, last seen normal at 8am Exam : awake, alert but combative.  GCS 11 on my initial assessment Plan: time of onset less than 8 hours, code stroke called.  Will follow closely Initial CT head delayed due to patient combativeness.  Pt was given ativan and she became less agitated for CT imaging   Joya Gaskins, MD 05/28/12 1456

## 2012-05-28 NOTE — ED Notes (Addendum)
Pt presently on a Holter monitor since last week (thursday) by cardiologist due to frequent syncopal episodes "lately" as per son. C-collar remains in place. Lac to posterior head with bleeding presently controlled

## 2012-05-28 NOTE — ED Notes (Signed)
Admitting MD, Dr. Molli Knock at bedside talking with family

## 2012-05-28 NOTE — ED Provider Notes (Signed)
I spoke to dr Thana Farr and she is cancelling any further neuro intervention  tPA in stroke considered but not given due to:  Onset over 3-4.5hours Cancelled by dr Irma Newness, MD 05/28/12 [phone]

## 2012-05-28 NOTE — Consult Note (Addendum)
Referring Physician: Bebe Shaggy    Chief Complaint: code stroke  HPI:                                                                                                                                         Julia [name] is an 79 y.o. female who was last seen normal by her family at 0800 and last spoken to at 79 by son.  Patient was found on the floor at 1330 on the floor and with head laceration. The room was significantly disrupted per son.  When found she was moving all her extremities at that time but would not follow any cammands.  While in ED she was moving all extremities 5/5 resisting any attempt to keep her limbs down, followed no commands verbal or visual.   Son states she has been having difficulty with her CHF and recently seen Dr. Tresa Endo who placed her on a diuretic.  She has also been placed on a Holter monitor due to multiple "black out spells"  Date last known well: 4.15.14 Time last known well: 1020 tPA Given: No: recent trauma and out of window  Past Medical History  Diagnosis Date  . Hypertension   . GERD (gastroesophageal reflux disease)   . Edema leg 12/06/2011  . Hyperlipidemia 12/06/2011  . CAD (coronary artery disease), severe single vessel LAD disease, nl. EF 12/08/2011    Unsuccessful PCI  . Shortness of breath     "all the time since last Friday" (12/12/2011)  . Type II diabetes mellitus   . CKD (chronic kidney disease) stage 2, GFR 60-89 ml/min 12/06/2011  . Stroke     "several small strokes"; denies residual  (12/12/2011)  . Arthritis     "all my joints" (12/12/2011)    Past Surgical History  Procedure Laterality Date  . Pseudoaneurysm repair  12/12/2011    "no surgery; did ultrasonic; right femoral compression"  . Appendectomy    . Cholecystectomy    . Colectomy      "took out 27 1/2 inches" (12/12/2011)  . Lumbar disc surgery      "2 discs removed, spacer put in" (12/12/2011)  . Carpal tunnel release      bilaterally  . Hammer toe surgery       right  . Abdominal hysterectomy    . Back surgery    . Coronary angioplasty with stent placement  12/08/2011    "failed"  . Cardiac catheterization  12/07/2011    Family history: Son alive and well.  Patient unable to give any family history  Social History:  reports that she has never smoked. She has never used smokeless tobacco. She reports that she does not drink alcohol or use illicit drugs.  Allergies: No Known Allergies  Medications:  No current facility-administered medications for this encounter.   Current Outpatient Prescriptions  Medication Sig Dispense Refill  . aspirin 81 MG chewable tablet Chew 81 mg by mouth daily.      Marland Kitchen atorvastatin (LIPITOR) 40 MG tablet Take 20 mg by mouth every evening.      . ferrous sulfate 325 (65 FE) MG tablet Take 1 tablet (325 mg total) by mouth 3 (three) times daily with meals.  90 tablet  5  . fish oil-omega-3 fatty acids 1000 MG capsule Take 1 g by mouth 2 (two) times daily.      . isosorbide mononitrate (IMDUR) 30 MG 24 hr tablet Take 60 mg by mouth daily.      . metFORMIN (GLUCOPHAGE) 500 MG tablet Take 500 mg by mouth 2 (two) times daily with a meal.      . metoprolol tartrate (LOPRESSOR) 25 MG tablet Take 37.5 mg by mouth 2 (two) times daily.      . Multiple Vitamin (MULTIVITAMIN WITH MINERALS) TABS Take 1 tablet by mouth daily.      . nitroGLYCERIN (NITROSTAT) 0.4 MG SL tablet Place 0.4 mg under the tongue every 5 (five) minutes as needed. For chest pain      . omega-3 acid ethyl esters (LOVAZA) 1 G capsule Take 1 capsule (1 g total) by mouth 2 (two) times daily.  60 capsule  5  . omeprazole (PRILOSEC) 20 MG capsule Take 20 mg by mouth daily.      . ondansetron (ZOFRAN) 4 MG tablet Take 1 tablet (4 mg total) by mouth every 6 (six) hours as needed for nausea.  20 tablet  0  . ranolazine (RANEXA) 500 MG 12 hr  tablet Take 1,000 mg by mouth 2 (two) times daily.         ROS:                                                                                                                                       History obtained from unobtainable from patient due to mental status   Neurologic Examination:                                                                                                      BP - 186/56  HR - 62  RR - 20  T - 98.3   SpO2 - 99%  Mental Status: (patient has been given 2 mg Ativan for agitation and imaging) initially patient was very agitated resisting  all motions to hold her down, followed no commands, no blink to threat, no vocalization.  Cranial Nerves: II: Discs flat bilaterally; Visual fields no blink to threat, pupils equal , round, sluggishly reactive to light  III,IV, VI: ptosis not present, extra-ocular motions shows no dolls and eyes slightly roving V,VII: face symmetric VIII: unable to assess IX,X: gag reflex present XI: bilateral shoulder shrug XII: unable to assess Motor: Initially moving all extremities with 5/5 strength,after Ativan all extremities were flaccid Tone and bulk:normal tone throughout; no atrophy noted Sensory: localized to pain with sternal rub, bilateral legs withdrew to nailbed pressure Deep Tendon Reflexes: 2+ bilateral UE, 1+ bilateral KJ and no AJ Plantars: Up going bilaterally CV: pulses palpable throughout    Results for orders placed during the hospital encounter of 05/28/12 (from the past 48 hour(s))  GLUCOSE, CAPILLARY     Status: Abnormal   Collection Time    05/28/12  2:28 PM      Result Value Range   Glucose-Capillary 261 (*) 70 - 99 mg/dL  POCT I-STAT, CHEM 8     Status: Abnormal   Collection Time    05/28/12  2:39 PM      Result Value Range   Sodium 137  135 - 145 mEq/L   Potassium 3.5  3.5 - 5.1 mEq/L   Chloride 96  96 - 112 mEq/L   BUN 9  6 - 23 mg/dL   Creatinine, Ser 4.69  0.50 - 1.10 mg/dL   Glucose, Bld 629  (*) 70 - 99 mg/dL   Calcium, Ion 5.28  4.13 - 1.30 mmol/L   TCO2 29  0 - 100 mmol/L   Hemoglobin 14.3  12.0 - 15.0 g/dL   HCT 24.4  01.0 - 27.2 %   No results found.  Assessment and plan discussed with with attending physician and they are in agreement.    Felicie Morn PA-C [facility] [phone]  05/28/2012, 2:47 PM   Patient seen and examined.  Clinical course and management discussed.  Necessary edits performed.  I agree with the above.  Assessment and plan of care developed and discussed below.    Assessment: 79 y.o. female found down by son.  Unclear etiology for loss of consciousness.  Patient now difficult to evaluate secondary to Ativan administration that was necessary secondary to agitation.  CT of the head reviewed and unremarkable.  Further work up recommended.  Patient not administered tPA due to being outside of the treatment window.    Stroke Risk Factors - diabetes mellitus, hyperlipidemia, hypertension and TIA  Plan: 1. HgbA1c, fasting lipid panel 2. MRI  of the brain without contrast 3. PT consult, OT consult, Speech consult 4. Echocardiogram 5. Carotid dopplers 6. Prophylactic therapy-continue ASA, may be given rectally 7. CTA of the brain to rule out thrombus 8. Telemetry monitoring 9. Frequent neuro checks  This patient is critically ill and at significant risk of neurological worsening, death and care requires constant monitoring of vital signs, hemodynamics,respiratory and cardiac monitoring, neurological assessment, discussion with family, other specialists and medical decision making of high complexity. I spent 60 minutes of neurocritical care time  in the care of  this patient.  Thana Farr, MD [facility] [phone] 05/28/2012  3:28 PM  Addendum: CTA reviewed and shows no significant stenosis.  No further neurologic testing to be performed at this time.  Would continue  With medical work up.  EEG to be performed in AM.     Thana Farr, MD  [facility] [phone]  05/28/2012  4:25 PM

## 2012-05-28 NOTE — Code Documentation (Signed)
79 yo female who was at home this morning. Spoke to her son by phone with conversation ending at 74 and seemed her usual self.  At 1330, son came to her home and found her down with furnishings disheveled "like a tormado had gone through."  He call EMS and they encoded to the ED 1416 with understanding that last known well was 0800, code stroke was not activated in the field.  ED RN notified stroke team, and they arrived in ED at 1422.  Pt arrived at 1422, seen by EDP at 1423 and cleared for CT; however she was mute and extremely agitated and she was give Ativan 1 mg IV which had little effect and was repeated. CT was unremarkable. NIHSS was 12 (see doc flowsheet).  Pt was unrepsonsive after the second dose of ativan.  It was decided to do a CTA and CT of cervical spine.  She tolerated those procedures well. Pt remains sedated. BP 256/70; Dr. Beverely Pace aware. Labetalol 10mg  ordered and given by ED RN.  Pt also had small amount emesis, yellow green, ? Aspirated; O2 sats 90% on 2 L/min; O2 increased to 4L/min and HOB raised to 45%.  Sign off w/ ED RN.

## 2012-05-28 NOTE — Progress Notes (Signed)
ANTIBIOTIC CONSULT NOTE - INITIAL  Pharmacy Consult for unasyn Indication: Aspiration PNA  No Known Allergies    Vital Signs: Temp: 98.3 F (36.8 C) (04/15 1500) Temp src: Rectal (04/15 1500) BP: 142/62 mmHg (04/15 1745) Pulse Rate: 68 (04/15 1745) Intake/Output from previous day:   Intake/Output from this shift:    Labs:  Recent Labs  05/28/12 1427 05/28/12 1439  WBC 14.1*  --   HGB 14.2 14.3  PLT 430*  --   CREATININE 0.67 0.80   The CrCl is unknown because both a height and weight (above a minimum accepted value) are required for this calculation. No results found for this basename: VANCOTROUGH, VANCOPEAK, VANCORANDOM, GENTTROUGH, GENTPEAK, GENTRANDOM, TOBRATROUGH, TOBRAPEAK, TOBRARND, AMIKACINPEAK, AMIKACINTROU, AMIKACIN,  in the last 72 hours   Microbiology: No results found for this or any previous visit (from the past 720 hour(s)).  Medical History: Past Medical History  Diagnosis Date  . Hypertension   . GERD (gastroesophageal reflux disease)   . Edema leg 12/06/2011  . Hyperlipidemia 12/06/2011  . CAD (coronary artery disease), severe single vessel LAD disease, nl. EF 12/08/2011    Unsuccessful PCI  . Shortness of breath     "all the time since last Friday" (12/12/2011)  . Type II diabetes mellitus   . CKD (chronic kidney disease) stage 2, GFR 60-89 ml/min 12/06/2011  . Stroke     "several small strokes"; denies residual  (12/12/2011)  . Arthritis     "all my joints" (12/12/2011)   Assessment: Patient is an 79 y.o F admitted to the ED for loss of consciousness and suspected stroke.  Head CT with no significant findings. She also had a witnessed aspiration after vomiting in the ED.  To start unasyn for empiric coverage. Scr 0.8 (est crcl~ 27)   Plan:  1) unasyn 1.5gm IV q12h  Draya Felker P 05/28/2012,6:15 PM

## 2012-05-28 NOTE — ED Notes (Signed)
2 staples placed by ED MD to posterior head lac. Tolerated well.

## 2012-05-28 NOTE — ED Notes (Signed)
ED MD at bedside, aware of elevated BP

## 2012-05-28 NOTE — ED Notes (Signed)
Patient transported to CT with stroke nurse

## 2012-05-28 NOTE — ED Notes (Signed)
Patient transported to CT with stroke nurse 

## 2012-05-28 NOTE — ED Provider Notes (Signed)
History     CSN: [id number]  Arrival date & time 05/28/12  1422   First MD Initiated Contact with Patient 05/28/12 1428      Chief Complaint  Patient presents with  . Code Stroke    (Consider location/radiation/quality/duration/timing/severity/associated sxs/prior treatment) HPI Comments: [name] with PMH of HTN, GERD, CHF/CAD, T2DM, CKD and prior CVAs here after she was found face down and altered this afternoon by her son.  He noted that she was not talking and that her face was "drawn", but did not note any weakness anywhere.  He last saw her normal around 8a so she is not a tPA candidate.  He reports that she has had her diuretics adjusted by her regular doctor recently and that she has had repeated syncopal events recently.   Patient is a 79 y.o. female presenting with altered mental status. The history is provided by the patient.  Altered Mental Status This is a new problem. The current episode started today. The problem occurs constantly. The problem has been unchanged. The treatment provided no relief.    Past Medical History  Diagnosis Date  . Hypertension   . GERD (gastroesophageal reflux disease)   . Edema leg 12/06/2011  . Hyperlipidemia 12/06/2011  . CAD (coronary artery disease), severe single vessel LAD disease, nl. EF 12/08/2011    Unsuccessful PCI  . Shortness of breath     "all the time since last Friday" (12/12/2011)  . Type II diabetes mellitus   . CKD (chronic kidney disease) stage 2, GFR 60-89 ml/min 12/06/2011  . Stroke     "several small strokes"; denies residual  (12/12/2011)  . Arthritis     "all my joints" (12/12/2011)    Past Surgical History  Procedure Laterality Date  . Pseudoaneurysm repair  12/12/2011    "no surgery; did ultrasonic; right femoral compression"  . Appendectomy    . Cholecystectomy    . Colectomy      "took out 27 1/2 inches" (12/12/2011)  . Lumbar disc surgery      "2 discs removed, spacer put in" (12/12/2011)  . Carpal  tunnel release      bilaterally  . Hammer toe surgery      right  . Abdominal hysterectomy    . Back surgery    . Coronary angioplasty with stent placement  12/08/2011    "failed"  . Cardiac catheterization  12/07/2011    No family history on file.  History  Substance Use Topics  . Smoking status: Never Smoker   . Smokeless tobacco: Never Used  . Alcohol Use: No    OB History   Grav Para Term Preterm Abortions TAB SAB Ect Mult Living                  Review of Systems  Unable to perform ROS: Mental status change  Psychiatric/Behavioral: Positive for altered mental status.    Allergies  Review of patient's allergies indicates no known allergies.  Home Medications   Current Outpatient Rx  Name  Route  Sig  Dispense  Refill  . aspirin 81 MG chewable tablet   Oral   Chew 81 mg by mouth daily.         Marland Kitchen atorvastatin (LIPITOR) 40 MG tablet   Oral   Take 20 mg by mouth every evening.         . ferrous sulfate 325 (65 FE) MG tablet   Oral   Take 1 tablet (325 mg total)  by mouth 3 (three) times daily with meals.   90 tablet   5   . fish oil-omega-3 fatty acids 1000 MG capsule   Oral   Take 1 g by mouth 2 (two) times daily.         . isosorbide mononitrate (IMDUR) 30 MG 24 hr tablet   Oral   Take 60 mg by mouth daily.         . metFORMIN (GLUCOPHAGE) 500 MG tablet   Oral   Take 500 mg by mouth 2 (two) times daily with a meal.         . metoprolol tartrate (LOPRESSOR) 25 MG tablet   Oral   Take 37.5 mg by mouth 2 (two) times daily.         . Multiple Vitamin (MULTIVITAMIN WITH MINERALS) TABS   Oral   Take 1 tablet by mouth daily.         . nitroGLYCERIN (NITROSTAT) 0.4 MG SL tablet   Sublingual   Place 0.4 mg under the tongue every 5 (five) minutes as needed. For chest pain         . omega-3 acid ethyl esters (LOVAZA) 1 G capsule   Oral   Take 1 capsule (1 g total) by mouth 2 (two) times daily.   60 capsule   5   . omeprazole  (PRILOSEC) 20 MG capsule   Oral   Take 20 mg by mouth daily.         . ondansetron (ZOFRAN) 4 MG tablet   Oral   Take 1 tablet (4 mg total) by mouth every 6 (six) hours as needed for nausea.   20 tablet   0   . ranolazine (RANEXA) 500 MG 12 hr tablet   Oral   Take 1,000 mg by mouth 2 (two) times daily.           BP 239/68  Pulse 66  Temp(Src) 98.3 F (36.8 C) (Rectal)  Resp 16  SpO2 100%  Physical Exam  Vitals reviewed. Constitutional: She appears well-developed and well-nourished.  HENT:  Right Ear: External ear normal.  Left Ear: External ear normal.  Mouth/Throat: No oropharyngeal exudate.  Eyes: Conjunctivae and EOM are normal. Pupils are equal, round, and reactive to light.  Pupils sluggish  Neck: Neck supple.  C collar in place  Cardiovascular: Normal rate, regular rhythm, normal heart sounds and intact distal pulses.  Exam reveals no gallop and no friction rub.   No murmur heard. Pulmonary/Chest: Effort normal and breath sounds normal.  Abdominal: Soft. Bowel sounds are normal. She exhibits no distension. There is no tenderness.  Musculoskeletal: Normal range of motion. She exhibits edema.  Neurological: She is alert. GCS eye subscore is 4. GCS verbal subscore is 2. GCS motor subscore is 5.  No appreciable focal neurologic deficits identified  Skin: Skin is warm and dry. No rash noted.  Psychiatric: She has a normal mood and affect.    ED Course  ARTERIAL LINE Date/Time: 05/28/2012 5:03 PM Performed by: Joya Gaskins Authorized by: Oleh Genin Consent: Verbal consent obtained. Risks and benefits: risks, benefits and alternatives were discussed Consent given by: power of attorney (son) Patient understanding: patient states understanding of the procedure being performed Patient consent: the patient's understanding of the procedure matches consent given Procedure consent: procedure consent matches procedure scheduled Relevant documents: relevant  documents present and verified Site marked: the operative site was marked Patient identity confirmed: arm band Time out: Immediately prior to procedure  a "time out" was called to verify the correct patient, procedure, equipment, support staff and site/side marked as required. Preparation: Patient was prepped and draped in the usual sterile fashion. Indications: multiple ABGs and hemodynamic monitoring Location: left radial Allen's test normal: yes Needle gauge: 20 Number of attempts: 1 Post-procedure: dressing applied Post-procedure CMS: normal Patient tolerance: Patient tolerated the procedure well with no immediate complications.  LACERATION REPAIR Date/Time: 05/28/2012 3:45 PM Performed by: Oleh Genin Authorized by: Oleh Genin Body area: head/neck Location details: scalp Laceration length: 3 cm Foreign bodies: no foreign bodies Tendon involvement: none Nerve involvement: none Vascular damage: no Irrigation solution: saline Irrigation method: jet lavage Amount of cleaning: standard Debridement: none Degree of undermining: none Skin closure: staples (2) Approximation: close Patient tolerance: Patient tolerated the procedure well with no immediate complications.   (including critical care time)  Labs Reviewed  CBC - Abnormal; Notable for the following:    WBC 14.1 (*)    Platelets 430 (*)    All other components within normal limits  DIFFERENTIAL - Abnormal; Notable for the following:    Neutrophils Relative 85 (*)    Neutro Abs 12.0 (*)    All other components within normal limits  COMPREHENSIVE METABOLIC PANEL - Abnormal; Notable for the following:    Chloride 94 (*)    Glucose, Bld 274 (*)    Albumin 3.2 (*)    Total Bilirubin 1.6 (*)    GFR calc non Af Amer 80 (*)    All other components within normal limits  GLUCOSE, CAPILLARY - Abnormal; Notable for the following:    Glucose-Capillary 261 (*)    All other components within normal limits  POCT I-STAT,  CHEM 8 - Abnormal; Notable for the following:    Glucose, Bld 271 (*)    All other components within normal limits  ETHANOL  PROTIME-INR  APTT  TROPONIN I  URINE RAPID DRUG SCREEN (HOSP PERFORMED)  URINALYSIS, ROUTINE W REFLEX MICROSCOPIC  POCT I-STAT TROPONIN I   Ct Angio Head W/cm &/or Wo Cm  05/28/2012  *RADIOLOGY REPORT*  Clinical Data:  Unresponsive.  CT ANGIOGRAPHY HEAD  Technique:  Multidetector CT imaging of the head was performed using the standard protocol during bolus administration of intravenous contrast.  Multiplanar CT image reconstructions including MIPs were obtained to evaluate the vascular anatomy.  Contrast: 50mL OMNIPAQUE IOHEXOL 350 MG/ML SOLN  Comparison:  CT head without contrast earlier in the day.  Findings:  The internal carotid arteries are patent without significant stenosis.  There is mild nonstenotic atherosclerotic change in the supraclinoid segments.  The basilar artery is widely patent with vertebrals codominant.  There is no proximal stenosis or occlusion of the anterior, or middle cerebral arteries.  Mild nonstenotic irregularity proximal posterior cerebral arteries bilaterally.  There is no visible asymmetry of flow to the cerebral hemispheres.  There is no visible cerebellar branch occlusion.  No intracranial aneurysm is observed.  On venous phase images, the major dural venous sinuses appear patent.  There is no abnormal intracranial enhancement.  Small incidental venous angioma may be present in the left frontal lobe inferiorly.   Review of the MIP images confirms the above findings.  IMPRESSION: Unremarkable CTA intracranial circulation.  There is no dramatic abnormality, such as carotid or basilar occlusion, which might explain the patient's altered level of consciousness.   Original Report Authenticated By: Davonna Belling, M.D.    Ct Head Wo Contrast  05/28/2012  *RADIOLOGY REPORT*  Clinical Data: 79 year old female  Code stroke.  Found down at 1330 hours.  CT  HEAD WITHOUT CONTRAST  Technique:  Contiguous axial images were obtained from the base of the skull through the vertex without contrast.  Comparison: 01/27/2012.  Findings: Visualized paranasal sinuses and mastoids are clear.  No acute osseous abnormality identified.  Visualized orbit soft tissues are within normal limits.  No definite scalp hematoma identified.  Calcified atherosclerosis at the skull base.  Degenerative changes in the upper cervical spine.  No midline shift, mass effect, or evidence of mass lesion.  No ventriculomegaly. No acute intracranial hemorrhage identified. Stable patchy periventricular white matter hypodensity. No evidence of cortically based acute infarction identified.  No suspicious intracranial vascular hyperdensity.  IMPRESSION: No acute intracranial abnormality.  Stable since 01/27/2012 with mild for age white matter changes.  Critical Value/emergent results were called by telephone at the time of interpretation on 01/27/2012  at 1458 hours to Dr. Thana Farr, who verbally acknowledged these results.   Original Report Authenticated By: Erskine Speed, M.D.    Ct Cervical Spine Wo Contrast  05/28/2012  *RADIOLOGY REPORT*  Clinical Data: Fall, unresponsive.  CT CERVICAL SPINE WITHOUT CONTRAST  Technique:  Multidetector CT imaging of the cervical spine was performed. Multiplanar CT image reconstructions were also generated.  Comparison: CT head earlier in the day.  Findings: Marked pannus surrounds the odontoid.  There is dorsal cystic formation within the body of C2 which is chronic.  There is no odontoid fracture.  The pannus measures up to 8 mm thick . There is mild stenosis at the foramen magnum.  Mild facet mediated slip is noted at C3-4, C4-5, and C5-6.  The bones are osteopenic but there is no cervical spine fracture. There is no traumatic subluxation or soft tissue swelling.  No intraspinal hematoma is present.  Calcified protrusion noted at C3- 4 with left-sided facet  arthropathy and foraminal narrowing.  Left- sided facet arthropathy also noted at C4-C5.  Right greater than left facet arthropathy at C5-C6.  No pneumothorax or lung apex lesions.Advanced atherosclerotic change at both carotid bifurcations.  Bilateral subcentimeter thyroid cysts.  IMPRESSION: Marked chronic pannus.  Advanced multilevel spondylosis.  No compression fracture or traumatic subluxation.  No intraspinal hematoma or neck mass.   Original Report Authenticated By: Davonna Belling, M.D.    Dg Chest Portable 1 View  05/28/2012  *RADIOLOGY REPORT*  Clinical Data: Confusion  PORTABLE CHEST - 1 VIEW  Comparison: 01/28/2012  Findings: Cardiomediastinal silhouette is stable.  Mild hyperinflation.  No acute infiltrate or pulmonary edema.  Stable chronic mild interstitial prominence. Atherosclerotic calcifications of thoracic aorta again noted.  IMPRESSION: No active disease.  Mild hyperinflation.  Stable chronic mild interstitial prominence.   Original Report Authenticated By: Natasha Mead, M.D.      1. Altered mental state   2. Hypertensive emergency   3. Leukocytosis   4. Hyperglycemia       MDM   [name] with PMH of HTN, GERD, CHF/CAD, T2DM, CKD and prior CVAs here after she was found face down and altered this afternoon by her son.  He noted that she was not talking and that her face was "drawn", but did not note any weakness anywhere.  He last saw her normal around 8a so she is not a tPA candidate.  He reports that she has had her diuretics adjusted by her regular doctor recently and that she has had repeated syncopal events recently.  On exam, afebrile, hypertensive to 180s.  PERRL, but  sluggish.  She was noted to be moving all four extremities with good strength.  Small laceration to her head, hemostatic.  She began verbalizing incomprehensible words just prior to going for CT scan.  Ativan given to obtain the study. Diff Dx: CVA, sepsis, metabolic abnormality, hypertensive encephalopathy.  6:06  PM Pt going for CT angiogram currently.  Discussions had with her son and HCPOA at the bedside and he indicated that she has expressed a desire to never be put on a "breathing machine".  We discussed the topics further and determined that CPR would not be in line with her wishes either.    6:06 PM Workup unrevealing at this point.  CT head and CT angio negative.  CXR without signs of PNA.  UA still not obtained.  Pt's mental status has still not cleared appropriately and AMS still of unclear etiology so decision made to treat for possible hypertensive encephalopathy with Cardene.  Left radial A line placed.  6:06 PM Critical Care consulted.  Repeat neuro exam as follows: PERRL, less sluggish.  She is now moving all 4 extremities and localizing to pain, but is not following commands.  Scalp lac irrigated and repaired with staples x2.    Disposition: Admit  Condition: Stable  Pt seen in conjunction with my attending, Dr. Zadie Rhine.Oleh Genin, MD PGY-II [facility] Emergency Medicine Resident   Oleh Genin, MD 05/28/12 [id number]

## 2012-05-28 NOTE — ED Notes (Signed)
GCems stopped in trauma room c before proceedings to CT due to patient  Being agitated

## 2012-05-28 NOTE — ED Notes (Signed)
Pt was found in floor by son at 1330. Pt was lsn at 0800. At this time patient will not speak, moving all four extremities and has a lac to the back of her head, code stroke called , pt given ativan ivp to calm her down enough for ct scan

## 2012-05-28 NOTE — ED Notes (Signed)
Did an in and out cath on patient clear yellow urine in return

## 2012-05-28 NOTE — ED Notes (Signed)
Returned from CT with Elliott Sink, Stroke nurse

## 2012-05-28 NOTE — H&P (Signed)
PULMONARY  / CRITICAL CARE MEDICINE  Name: Julia [name] MRN: [id number] DOB: 03/01/1927    ADMISSION DATE:  05/28/2012 CONSULTATION DATE:  05/28/2012  REFERRING MD :  EDP PRIMARY SERVICE: PCCM  CHIEF COMPLAINT:  Unresponsive and HTN.  BRIEF PATIENT DESCRIPTION: 80 yo female who was at home on the day of admission. Spoke to her son by phone with conversation ending at 28 and seemed her usual self. At 1330, son went to her home and found her down disheveled. He called EMS and they encoded to the ED 1416 with the understanding that she was last seen well at 0800. ED RN notified stroke team, and they arrived in ED at 1422. Head CT was ordered; however she was mute and extremely agitated and she was give Ativan 1 mg IV which had little effect and was repeated. CT was unremarkable. Pt was unrepsonsive after the second dose of ativan. It was decided to do a CTA and CT of cervical spine. She tolerated those procedures well. Pt remained unresponsive.  BP was 256/70.  Patient was started on a cardene drip and PCCM was called to admit.  SIGNIFICANT EVENTS / STUDIES:  4/15 Head CT negative.  LINES / TUBES: PIV  CULTURES: Blood 4/15>>> Urine 4/15>>> Sputum 4/15>>>  ANTIBIOTICS: Unasyn 4/15>>>  PAST MEDICAL HISTORY :  Past Medical History  Diagnosis Date  . Hypertension   . GERD (gastroesophageal reflux disease)   . Edema leg 12/06/2011  . Hyperlipidemia 12/06/2011  . CAD (coronary artery disease), severe single vessel LAD disease, nl. EF 12/08/2011    Unsuccessful PCI  . Shortness of breath     "all the time since last Friday" (12/12/2011)  . Type II diabetes mellitus   . CKD (chronic kidney disease) stage 2, GFR 60-89 ml/min 12/06/2011  . Stroke     "several small strokes"; denies residual  (12/12/2011)  . Arthritis     "all my joints" (12/12/2011)   Past Surgical History  Procedure Laterality Date  . Pseudoaneurysm repair  12/12/2011    "no surgery; did ultrasonic; right  femoral compression"  . Appendectomy    . Cholecystectomy    . Colectomy      "took out 27 1/2 inches" (12/12/2011)  . Lumbar disc surgery      "2 discs removed, spacer put in" (12/12/2011)  . Carpal tunnel release      bilaterally  . Hammer toe surgery      right  . Abdominal hysterectomy    . Back surgery    . Coronary angioplasty with stent placement  12/08/2011    "failed"  . Cardiac catheterization  12/07/2011   Prior to Admission medications   Medication Sig Start Date End Date Taking? Authorizing Provider  aspirin 81 MG chewable tablet Chew 81 mg by mouth daily.    Historical Provider, MD  atorvastatin (LIPITOR) 40 MG tablet Take 20 mg by mouth every evening.    Historical Provider, MD  ferrous sulfate 325 (65 FE) MG tablet Take 1 tablet (325 mg total) by mouth 3 (three) times daily with meals. 12/10/11   Wilburt Finlay, PA-C  fish oil-omega-3 fatty acids 1000 MG capsule Take 1 g by mouth 2 (two) times daily.    Historical Provider, MD  isosorbide mononitrate (IMDUR) 30 MG 24 hr tablet Take 60 mg by mouth daily. 12/10/11   Wilburt Finlay, PA-C  metFORMIN (GLUCOPHAGE) 500 MG tablet Take 500 mg by mouth 2 (two) times daily with a meal.  Historical Provider, MD  metoprolol tartrate (LOPRESSOR) 25 MG tablet Take 37.5 mg by mouth 2 (two) times daily.    Historical Provider, MD  Multiple Vitamin (MULTIVITAMIN WITH MINERALS) TABS Take 1 tablet by mouth daily.    Historical Provider, MD  nitroGLYCERIN (NITROSTAT) 0.4 MG SL tablet Place 0.4 mg under the tongue every 5 (five) minutes as needed. For chest pain    Historical Provider, MD  omega-3 acid ethyl esters (LOVAZA) 1 G capsule Take 1 capsule (1 g total) by mouth 2 (two) times daily. 12/10/11   Wilburt Finlay, PA-C  omeprazole (PRILOSEC) 20 MG capsule Take 20 mg by mouth daily.    Historical Provider, MD  ondansetron (ZOFRAN) 4 MG tablet Take 1 tablet (4 mg total) by mouth every 6 (six) hours as needed for nausea. 01/29/12   Minda Meo, MD  ranolazine (RANEXA) 500 MG 12 hr tablet Take 1,000 mg by mouth 2 (two) times daily. 12/10/11   Wilburt Finlay, PA-C   No Known Allergies  FAMILY HISTORY:  No family history on file. SOCIAL HISTORY:  reports that she has never smoked. She has never used smokeless tobacco. She reports that she does not drink alcohol or use illicit drugs.  REVIEW OF SYSTEMS:  Unattainable, patient is unresponsive.  SUBJECTIVE:   VITAL SIGNS: Temp:  [98.3 F (36.8 C)] 98.3 F (36.8 C) (04/15 1500) Pulse Rate:  [58-80] 68 (04/15 1745) Resp:  [16-22] 17 (04/15 1745) BP: (142-246)/(52-81) 142/62 mmHg (04/15 1745) SpO2:  [95 %-100 %] 99 % (04/15 1745) Arterial Line BP: (174-178)/(52-53) 174/53 mmHg (04/15 1745) HEMODYNAMICS:   VENTILATOR SETTINGS:   INTAKE / OUTPUT: Intake/Output   None     PHYSICAL EXAMINATION: General:  Chronically ill appearing elderly female. Neuro:  Unresponsive, groans to pain. HEENT:  /AT, PERRL, EOM-I, -thyromegally and -LAN. Cardiovascular:  RRR, Nl S1/S2, -M/R/G. Lungs:  Coarse BS from the upper airway. Abdomen:  Soft, NT, ND and +BS. Musculoskeletal:  -edema and -tenderness. Skin:  Intact.  LABS:  Recent Labs Lab 05/28/12 1427 05/28/12 1428 05/28/12 1439  HGB 14.2  --  14.3  WBC 14.1*  --   --   PLT 430*  --   --   NA 137  --  137  K 3.7  --  3.5  CL 94*  --  96  CO2 28  --   --   GLUCOSE 274*  --  271*  BUN 11  --  9  CREATININE 0.67  --  0.80  CALCIUM 10.0  --   --   AST 26  --   --   ALT 14  --   --   ALKPHOS 80  --   --   BILITOT 1.6*  --   --   PROT 6.5  --   --   ALBUMIN 3.2*  --   --   APTT 33  --   --   INR 0.98  --   --   TROPONINI  --  <0.30  --     Recent Labs Lab 05/28/12 1428  GLUCAP 261*    CXR: No aspiration.  ASSESSMENT / PLAN:  PULMONARY A: Ability to protect airway is questionable at best.  Patient is DNR per family however. P:   - NTS PRN. - Titrate O2 as tolerated. - ABG now. - Not a candidate  for NIV due to questionable airway protection capacity.  CARDIOVASCULAR A: HTN. P:  - Cardene drip. - Echo and  carotid dopplers. - Continue ASA. - Monitor Troponins.  RENAL A:  Mild hypokalemia. P:   - IV replacement x4. - BMET in AM.  GASTROINTESTINAL A:  No active issues. P:   - Maintain NPO. - ?need for NGT for PO anti-HTN.  Will hold off for now, hope is that she will be awake enough as BP improves to be able to swallow.  HEMATOLOGIC A:  Leukocytosis.  ?aspiration. P:  - Daily CBC. - See ID section.  INFECTIOUS A:  ?aspiration PNA.  CXR clear but patient was witnessed aspirating in ED after vomiting. P:   - Pan culture. - Unasyn.  ENDOCRINE A:  DM history.   P:   - CBG and ISS.  NEUROLOGIC A:  Likely HTN encephalopathy versus PRES versus ischemic CVA. P:   - See neuro notes for recommendations. - Admit to the ICU. - Strict BP control as per neuro.  Spoke with patient's son and daughter in law, patient is no CPR/Cardioversion/Intubation so limited code blow.  I have personally obtained a history, examined the patient, evaluated laboratory and imaging results, formulated the assessment and plan and placed orders.  CRITICAL CARE: The patient is critically ill with multiple organ systems failure and requires high complexity decision making for assessment and support, frequent evaluation and titration of therapies, application of advanced monitoring technologies and extensive interpretation of multiple databases. Critical Care Time devoted to patient care services described in this note is 45 minutes.   Alyson Reedy, M.D. Pulmonary and Critical Care Medicine [facility] Pager: [phone]  05/28/2012, 5:50 PM

## 2012-05-29 ENCOUNTER — Encounter (HOSPITAL_COMMUNITY): Payer: Self-pay | Admitting: *Deleted

## 2012-05-29 ENCOUNTER — Inpatient Hospital Stay (HOSPITAL_COMMUNITY): Payer: Medicare Other

## 2012-05-29 DIAGNOSIS — I059 Rheumatic mitral valve disease, unspecified: Secondary | ICD-10-CM

## 2012-05-29 LAB — BASIC METABOLIC PANEL
BUN: 19 mg/dL (ref 6–23)
Calcium: 9.2 mg/dL (ref 8.4–10.5)
GFR calc Af Amer: 74 mL/min — ABNORMAL LOW (ref >90)
GFR calc non Af Amer: 64 mL/min — ABNORMAL LOW (ref >90)
Glucose, Bld: 103 mg/dL — ABNORMAL HIGH (ref 70–99)
Potassium: 4 mEq/L (ref 3.5–5.1)
Sodium: 137 mEq/L (ref 135–145)

## 2012-05-29 LAB — TROPONIN I
Troponin I: <0.3 ng/mL (ref <0.30)
Troponin I: <0.3 ng/mL (ref <0.30)

## 2012-05-29 LAB — URINALYSIS, ROUTINE W REFLEX MICROSCOPIC
Bilirubin Urine: NEGATIVE
Ketones, ur: 15 mg/dL — AB
Leukocytes, UA: NEGATIVE
Nitrite: NEGATIVE
Protein, ur: >300 mg/dL — AB
Urobilinogen, UA: 0.2 mg/dL (ref 0.0–1.0)
pH: 7.5 (ref 5.0–8.0)

## 2012-05-29 LAB — GLUCOSE, CAPILLARY
Glucose-Capillary: 67 mg/dL — ABNORMAL LOW (ref 70–99)
Glucose-Capillary: 73 mg/dL (ref 70–99)

## 2012-05-29 LAB — CBC
Hemoglobin: 11.6 g/dL — ABNORMAL LOW (ref 12.0–15.0)
MCH: 30.6 pg (ref 26.0–34.0)
MCHC: 36.7 g/dL — ABNORMAL HIGH (ref 30.0–36.0)
RDW: 13 % (ref 11.5–15.5)

## 2012-05-29 LAB — PHOSPHORUS: Phosphorus: 3.6 mg/dL (ref 2.3–4.6)

## 2012-05-29 LAB — URINE MICROSCOPIC-ADD ON

## 2012-05-29 MED ORDER — DEXTROSE 50 % IV SOLN
INTRAVENOUS | Status: AC
  Filled 2012-05-29: qty 50

## 2012-05-29 MED ORDER — GADOBENATE DIMEGLUMINE 529 MG/ML IV SOLN
6.0000 mL | Freq: Once | INTRAVENOUS | Status: AC
  Administered 2012-05-29: 6 mL via INTRAVENOUS

## 2012-05-29 MED ORDER — MORPHINE BOLUS VIA INFUSION
5.0000 mg | INTRAVENOUS | Status: DC | PRN
  Filled 2012-05-29: qty 20

## 2012-05-29 MED ORDER — ATROPINE SULFATE 1 % OP SOLN
2.0000 [drp] | OPHTHALMIC | Status: DC | PRN
  Filled 2012-05-29: qty 2

## 2012-05-29 MED ORDER — FAMOTIDINE IN NACL 20-0.9 MG/50ML-% IV SOLN
20.0000 mg | INTRAVENOUS | Status: DC
  Administered 2012-05-29: 20 mg via INTRAVENOUS
  Filled 2012-05-29 (×2): qty 50

## 2012-05-29 MED ORDER — INSULIN ASPART 100 UNIT/ML ~~LOC~~ SOLN: 10.0000 [IU] | Freq: Once | SUBCUTANEOUS | Status: AC

## 2012-05-29 MED ORDER — MORPHINE SULFATE 25 MG/ML IV SOLN
10.0000 mg/h | INTRAVENOUS | Status: DC
  Filled 2012-05-29: qty 10

## 2012-05-29 MED ORDER — LORAZEPAM 2 MG/ML IJ SOLN
0.5000 mg | Freq: Once | INTRAMUSCULAR | Status: AC | PRN
  Administered 2012-05-29: 0.5 mg via INTRAVENOUS
  Filled 2012-05-29: qty 1

## 2012-05-29 MED ORDER — ACETAMINOPHEN 650 MG RE SUPP: 650.0000 mg | Freq: Four times a day (QID) | RECTAL | Status: DC | PRN

## 2012-05-29 MED ORDER — DEXTROSE 50 % IV SOLN
25.0000 mL | Freq: Once | INTRAVENOUS | Status: AC | PRN
  Administered 2012-05-29: 25 mL via INTRAVENOUS

## 2012-05-29 NOTE — Progress Notes (Signed)
UR completed 

## 2012-05-29 NOTE — Progress Notes (Signed)
Subjective: Continues to have difficulty with language.   Exam: Filed Vitals:   05/29/12 0700  BP:   Pulse:   Temp: 97.6 F (36.4 C)  Resp:    Gen: In bed, NAD MS: Awake, Fixates and tracks examiner, does nto follow commands or answer questions.  ZO:XWRUE, tracks across midline bilaterally, face symmetic Motor: moves all extremities spontaneously and purposefully.  Sensory:responds to nox stim x 4.   Impression: 79 yo F with new onset aphasia in the setting of hypertension. Possibilities include ischemic stroke, hypotensive event with subsequent ischemia, seizure with prolonged todd's phenomenon. Hypertension responded to initial therapy, now in 140s.    Recommendations: 1) MRI brain  2) EEG 3) Further workup pending these studies.   [name], MD [facility] [phone]  If 7pm- 7am, please page neurology on call at [phone].

## 2012-05-29 NOTE — Progress Notes (Signed)
PULMONARY  / CRITICAL CARE MEDICINE  Name: Julia [name] MRN: [id number] DOB: 25-Jun-1927    ADMISSION DATE:  05/28/2012 CONSULTATION DATE:  05/28/2012  REFERRING MD :  EDP PRIMARY SERVICE: PCCM  CHIEF COMPLAINT:  Unresponsive and HTN.  BRIEF PATIENT DESCRIPTION: 79 yo female who was at home on the day of admission. Spoke to her son by phone with conversation ending at 48 and seemed her usual self. At 1330, son went to her home and found her down disheveled. He called EMS and they encoded to the ED 1416 with the understanding that she was last seen well at 0800. ED RN notified stroke team, and they arrived in ED at 1422. Head CT was ordered; however she was mute and extremely agitated and she was give Ativan 1 mg IV which had little effect and was repeated. CT was unremarkable. Pt was unrepsonsive after the second dose of ativan. It was decided to do a CTA and CT of cervical spine. She tolerated those procedures well. Pt remained unresponsive.  BP was 256/70.  Patient was started on a cardene drip and PCCM was called to admit. PMH - Dm-2, Htn, CVA  SIGNIFICANT EVENTS / STUDIES:  4/15 Head CT negative.  LINES / TUBES: PIV  CULTURES: Blood 4/15>>> Urine 4/15>>> Sputum 4/15>>>  ANTIBIOTICS: Unasyn 4/15>>>   SUBJECTIVE: afebrile Off cardene drip No obvious pain  VITAL SIGNS: Temp:  [97.6 F (36.4 C)-99 F (37.2 C)] 97.6 F (36.4 C) (04/16 0700) Pulse Rate:  [58-85] 58 (04/16 0600) Resp:  [16-22] 18 (04/16 0400) BP: (115-246)/(40-124) 147/40 mmHg (04/16 0600) SpO2:  [95 %-100 %] 100 % (04/16 0600) Arterial Line BP: (167-188)/(35-56) 167/35 mmHg (04/15 2000) Weight:  [34.2 kg (75 lb 6.4 oz)] 34.2 kg (75 lb 6.4 oz) (04/16 0500) HEMODYNAMICS:   VENTILATOR SETTINGS:   INTAKE / OUTPUT: Intake/Output     04/15 0701 - 04/16 0700 04/16 0701 - 04/17 0700   I.V. (mL/kg) 200 (5.8)    IV Piggyback 400    Total Intake(mL/kg) 600 (17.5)    Net +600          Urine Occurrence 2  x    Emesis Occurrence 1 x      PHYSICAL EXAMINATION: General:  Chronically ill appearing elderly female. Neuro:  More responsive,GCS 13, able to say her name, poor word output, power 3+/5, does not follow commands HEENT:  [place]/AT, PERRL, EOM-I, -thyromegally and -LAN. Cardiovascular:  RRR, Nl S1/S2, -M/R/G. Lungs:  Coarse BS from the upper airway. Abdomen:  Soft, NT, ND and +BS. Musculoskeletal:  -edema and -tenderness. Skin:  Intact.  LABS:  Recent Labs Lab 05/28/12 1427  05/28/12 1439 05/28/12 1828 05/28/12 1830 05/28/12 1855 05/28/12 1900 05/29/12 0043 05/29/12 0530  HGB 14.2  --  14.3 13.8  --   --   --   --  11.6*  WBC 14.1*  --   --  20.2*  --   --   --   --  16.6*  PLT 430*  --   --  386  --   --   --   --  310  NA 137  --  137 137  --   --   --   --  137  K 3.7  --  3.5 3.7  --   --   --   --  4.0  CL 94*  --  96 97  --   --   --   --  103  CO2 28  --   --  26  --   --   --   --  30  GLUCOSE 274*  --  271* 301*  --   --   --   --  103*  BUN 11  --  9 13  --   --   --   --  19  CREATININE 0.67  --  0.80 0.66  --   --   --   --  0.83  CALCIUM 10.0  --   --  9.7  --   --   --   --  9.2  MG  --   --   --  1.6  --   --   --   --  1.6  PHOS  --   --   --  4.2  --   --   --   --  3.6  AST 26  --   --  30  --   --   --   --   --   ALT 14  --   --  14  --   --   --   --   --   ALKPHOS 80  --   --  73  --   --   --   --   --   BILITOT 1.6*  --   --  1.5*  --   --   --   --   --   PROT 6.5  --   --  5.8*  --   --   --   --   --   ALBUMIN 3.2*  --   --  2.8*  --   --   --   --   --   APTT 33  --   --  33  --   --   --   --   --   INR 0.98  --   --  1.04  --   --   --   --   --   LATICACIDVEN  --   --   --   --   --   --  1.4  --   --   TROPONINI  --   < >  --   --   --  <0.30  --  <0.30 <0.30  PROCALCITON  --   --   --   --  <0.10  --   --   --   --   PROBNP  --   --   --   --   --  2859.0*  --   --   --   < > = values in this interval not displayed.  Recent Labs Lab  05/28/12 2157 05/28/12 2334 05/29/12 0112 05/29/12 0334 05/29/12 0742  GLUCAP 297* 303* 252* 158* 67*    CXR: No aspiration.  ASSESSMENT / PLAN:  PULMONARY A: Ability to protect airway is questionable at best.  Patient is DNR per family however. P:   - Titrate O2 as tolerated.  CARDIOVASCULAR A: HTN. P:  - Off Cardene drip, use labetalol prn - Echo and carotid dopplers. - Continue ASA. - Monitor Troponins.  RENAL A:  Mild hypokalemia. P:   - replaced  GASTROINTESTINAL A:  No active issues. P:   - Maintain NPO. - ?need for NGT for PO anti-HTN.  Will hold off for now, hope is that she will be awake enough as  BP improves to be able to swallow.  HEMATOLOGIC A:  Leukocytosis.  ?aspiration. P:  - Daily CBC. - See ID section.  INFECTIOUS A:  ?aspiration PNA.  CXR clear but patient was witnessed aspirating in ED after vomiting. P:   - Pan culture. - Unasyn.  ENDOCRINE A:  DM history.   P:   - CBG and ISS.  NEUROLOGIC A:  Likely HTN encephalopathy versus PRES versus ischemic CVA. P:   - See neuro notes for recommendations. - Admit to the ICU. - Strict BP control as per neuro.  D/wwith patient's son , patient is no CPR/Cardioversion/Intubation so limited code blow.  ALVA,RAKESH V.  [phone]  05/29/2012, 9:27 AM

## 2012-05-29 NOTE — Progress Notes (Signed)
Orthopedic Tech Progress Note Patient Details:  Julia [name] [birth year] [id number] Completed by bio-tech. Patient ID: Julia [name], female   DOB: Feb 12, 1928, 79 y.o.   MRN: [id number]   [name] 05/29/2012, 4:13 PM

## 2012-05-29 NOTE — Progress Notes (Signed)
  Echocardiogram 2D Echocardiogram has been performed.  Julia [name] 05/29/2012, 2:53 PM 

## 2012-05-29 NOTE — Progress Notes (Deleted)
Called to bedside by RN for discussion with husband and children (son, daughter & daughter in law present) regarding patients current state of health & withdrawal of care.  Discussed extensive head bleed and terminal prognosis.  Family indicate understanding and would like to withdrawal care.  Husband would like to spend some time at bedside prior to removal.  Reviewed stages of dying with family, comfort measures that will be utilized and answered questions.     Plan: -withdrawal of care order set in place, RN to release when family ready -PRN atropine for secretions -morphine to be initiated one hour prior to extubation per RN with titration for respiratory rate of 12-20 / min   Canary Brim, NP-C [facility] Pgr: [phone] or [phone]

## 2012-05-29 NOTE — Progress Notes (Signed)
INITIAL NUTRITION ASSESSMENT  Pt meets criteria for SEVERE MALNUTRITION in the context of chronic illness as evidenced by 10% weight loss x 2 months, and severe fat and muscle loss.  DOCUMENTATION CODES Per approved criteria  -Severe malnutrition in the context of chronic illness   INTERVENTION: 1. Supplement diet as appropriate.  2. If enteral nutrition desired, recommend initiate Jevity 1.2 @ 10 ml/hr and increase by 10 ml every 24 hours to goal rate of 45 ml/hr. At goal rate, tube feeding regimen will provide 1296 kcal, 60 grams of protein, and 875 ml of H2O.   3. Recommend monitor magnesium, potassium, and phosphorus daily for at least 3 days, MD to replete as needed, as pt is at risk for refeeding syndrome given severe malnutrition.  NUTRITION DIAGNOSIS: Inadequate oral intake related to inability to eat as evidenced by NPO status.  Goal: Pt to meet >/= 90% of their estimated nutrition needs.   Monitor:  Plan of care, swallow ability   Reason for Assessment: Low Braden  79 y.o. female  Admitting Dx: Unresponsive and HTN  ASSESSMENT: Pt admitted for possible stroke which has been ruled out. Per MD pt likely with HTN encephalopathy. Pt discussed during ICU rounds and with RN. Daughter and granddaughter at bedside and provide hx. They report that pt has been losing weight x 2 months due to nausea which they feel is related to her cardiac medications which are being adjusted as an outpatient due to her not being a surgical candidate. Pt with documented 10% weight loss which family feels has been within the last 2 months.  Pt would be at very high risk for refeeding if nutrition support initiated.  Pt is DNR.   Height: Ht Readings from Last 1 Encounters:  05/28/12 4\' 6"  (1.372 m)    Weight: Wt Readings from Last 1 Encounters:  05/29/12 75 lb 6.4 oz (34.2 kg)    Ideal Body Weight: 40.8 kg  % Ideal Body Weight: 84%  Wt Readings from Last 10 Encounters:  05/29/12 75 lb  6.4 oz (34.2 kg)  01/29/12 83 lb 6.4 oz (37.83 kg)  12/12/11 190 lb 14.7 oz (86.6 kg)  12/08/11 85 lb 8.6 oz (38.8 kg)  12/08/11 85 lb 8.6 oz (38.8 kg)  12/08/11 85 lb 8.6 oz (38.8 kg)    Usual Body Weight: 83 lb 12/14, 90 lb per daughter  % Usual Body Weight: 90%  BMI:  Body mass index is 18.17 kg/(m^2). Underweight  Estimated Nutritional Needs: Kcal: 1200-1400 Protein: 55-70 Fluid: > 1.5 L/day  Skin: laceration  Nutrition Focused Physical Exam:  Subcutaneous Fat:  Orbital Region: WNL Upper Arm Region: severe wasting  Thoracic and Lumbar Region: severe wasting  Muscle:  Temple Region: mild wasting  Clavicle Bone Region: severe wasting Clavicle and Acromion Bone Region: severe wasting Scapular Bone Region: NA Dorsal Hand: severe wasting Patellar Region: NA Anterior Thigh Region: severe wasting Posterior Calf Region: severe wasting  Edema: left leg edema, per daughter lasix had been increased PTA  Diet Order: NPO  EDUCATION NEEDS: -No education needs identified at this time   Intake/Output Summary (Last 24 hours) at 05/29/12 1210 Last data filed at 05/29/12 0900  Gross per 24 hour  Intake    690 ml  Output      0 ml  Net    690 ml    Last BM: PTA   Labs:   Recent Labs Lab 05/28/12 1427 05/28/12 1439 05/28/12 1828 05/29/12 0530  NA 137 137  137 137  K 3.7 3.5 3.7 4.0  CL 94* 96 97 103  CO2 28  --  26 30  BUN 11 9 13 19   CREATININE 0.67 0.80 0.66 0.83  CALCIUM 10.0  --  9.7 9.2  MG  --   --  1.6 1.6  PHOS  --   --  4.2 3.6  GLUCOSE 274* 271* 301* 103*    CBG (last 3)   Recent Labs  05/29/12 0112 05/29/12 0334 05/29/12 0742  GLUCAP 252* 158* 67*    Scheduled Meds: . ampicillin-sulbactam (UNASYN) IV  1.5 g Intravenous Once  . ampicillin-sulbactam (UNASYN) IV  1.5 g Intravenous Q12H  . aspirin  324 mg Oral NOW   Or  . aspirin  300 mg Rectal NOW  . heparin  5,000 Units Subcutaneous Q8H  . insulin aspart  2-6 Units Subcutaneous Q4H     Continuous Infusions: . sodium chloride      Past Medical History  Diagnosis Date  . Hypertension   . GERD (gastroesophageal reflux disease)   . Edema leg 12/06/2011  . Hyperlipidemia 12/06/2011  . CAD (coronary artery disease), severe single vessel LAD disease, nl. EF 12/08/2011    Unsuccessful PCI  . Shortness of breath     "all the time since last Friday" (12/12/2011)  . Type II diabetes mellitus   . CKD (chronic kidney disease) stage 2, GFR 60-89 ml/min 12/06/2011  . Stroke     "several small strokes"; denies residual  (12/12/2011)  . Arthritis     "all my joints" (12/12/2011)    Past Surgical History  Procedure Laterality Date  . Pseudoaneurysm repair  12/12/2011    "no surgery; did ultrasonic; right femoral compression"  . Appendectomy    . Cholecystectomy    . Colectomy      "took out 27 1/2 inches" (12/12/2011)  . Lumbar disc surgery      "2 discs removed, spacer put in" (12/12/2011)  . Carpal tunnel release      bilaterally  . Hammer toe surgery      right  . Abdominal hysterectomy    . Back surgery    . Coronary angioplasty with stent placement  12/08/2011    "failed"  . Cardiac catheterization  12/07/2011    Kendell Bane RD, LDN, CNSC [phone] Pager [phone] After Hours Pager

## 2012-05-30 ENCOUNTER — Inpatient Hospital Stay (HOSPITAL_COMMUNITY): Payer: Medicare Other

## 2012-05-30 LAB — CBC
HCT: 31.5 % — ABNORMAL LOW (ref 36.0–46.0)
MCH: 30.6 pg (ref 26.0–34.0)
MCHC: 35.2 g/dL (ref 30.0–36.0)
MCV: 86.8 fL (ref 78.0–100.0)
RDW: 13.2 % (ref 11.5–15.5)

## 2012-05-30 LAB — URINE CULTURE
Colony Count: NO GROWTH
Culture: NO GROWTH
Special Requests: NORMAL

## 2012-05-30 LAB — BASIC METABOLIC PANEL
BUN: 24 mg/dL — ABNORMAL HIGH (ref 6–23)
CO2: 28 mEq/L (ref 19–32)
Chloride: 104 mEq/L (ref 96–112)
Creatinine, Ser: 0.97 mg/dL (ref 0.50–1.10)
Glucose, Bld: 75 mg/dL (ref 70–99)

## 2012-05-30 LAB — GLUCOSE, CAPILLARY
Glucose-Capillary: 102 mg/dL — ABNORMAL HIGH (ref 70–99)
Glucose-Capillary: 104 mg/dL — ABNORMAL HIGH (ref 70–99)
Glucose-Capillary: 86 mg/dL (ref 70–99)
Glucose-Capillary: 86 mg/dL (ref 70–99)

## 2012-05-30 MED ORDER — ATORVASTATIN CALCIUM 20 MG PO TABS
20.0000 mg | ORAL_TABLET | Freq: Every day | ORAL | Status: DC
  Administered 2012-05-30 – 2012-06-03 (×5): 20 mg via ORAL
  Filled 2012-05-30 (×5): qty 1

## 2012-05-30 MED ORDER — TORSEMIDE 20 MG PO TABS
20.0000 mg | ORAL_TABLET | Freq: Every day | ORAL | Status: DC
  Administered 2012-05-30 – 2012-06-01 (×3): 20 mg via ORAL
  Filled 2012-05-30 (×3): qty 1

## 2012-05-30 MED ORDER — WHITE PETROLATUM GEL
Status: AC
  Filled 2012-05-30: qty 5

## 2012-05-30 MED ORDER — ENSURE COMPLETE PO LIQD
237.0000 mL | Freq: Two times a day (BID) | ORAL | Status: DC
  Administered 2012-05-30 – 2012-06-01 (×4): 237 mL via ORAL

## 2012-05-30 MED ORDER — DEXTROSE-NACL 5-0.45 % IV SOLN
INTRAVENOUS | Status: DC
  Administered 2012-05-30 – 2012-06-01 (×2): via INTRAVENOUS

## 2012-05-30 MED ORDER — ASPIRIN 81 MG PO CHEW
81.0000 mg | CHEWABLE_TABLET | Freq: Every day | ORAL | Status: DC
  Administered 2012-05-30 – 2012-06-03 (×5): 81 mg via ORAL
  Filled 2012-05-30 (×5): qty 1

## 2012-05-30 MED ORDER — METOPROLOL TARTRATE 50 MG PO TABS
50.0000 mg | ORAL_TABLET | Freq: Two times a day (BID) | ORAL | Status: DC
  Administered 2012-05-30 – 2012-06-03 (×8): 50 mg via ORAL
  Filled 2012-05-30 (×10): qty 1

## 2012-05-30 MED ORDER — PANTOPRAZOLE SODIUM 40 MG PO TBEC
40.0000 mg | DELAYED_RELEASE_TABLET | Freq: Every day | ORAL | Status: DC
  Administered 2012-05-30 – 2012-06-03 (×5): 40 mg via ORAL
  Filled 2012-05-30 (×5): qty 1

## 2012-05-30 MED ORDER — CHLORHEXIDINE GLUCONATE 0.12 % MT SOLN
15.0000 mL | Freq: Two times a day (BID) | OROMUCOSAL | Status: DC
  Administered 2012-05-30: 15 mL via OROMUCOSAL
  Filled 2012-05-30: qty 15

## 2012-05-30 MED ORDER — SODIUM CHLORIDE 0.9 % IV SOLN
1000.0000 mg | Freq: Once | INTRAVENOUS | Status: AC
  Administered 2012-05-30: 1000 mg via INTRAVENOUS
  Filled 2012-05-30: qty 10

## 2012-05-30 MED ORDER — SODIUM CHLORIDE 0.9 % IV SOLN
500.0000 mg | Freq: Once | INTRAVENOUS | Status: AC
  Administered 2012-05-30: 500 mg via INTRAVENOUS
  Filled 2012-05-30: qty 5

## 2012-05-30 MED ORDER — BIOTENE DRY MOUTH MT LIQD: 15.0000 mL | Freq: Two times a day (BID) | OROMUCOSAL | Status: DC

## 2012-05-30 MED ORDER — ISOSORBIDE MONONITRATE ER 60 MG PO TB24
60.0000 mg | ORAL_TABLET | Freq: Every day | ORAL | Status: DC
  Administered 2012-05-30 – 2012-06-03 (×5): 60 mg via ORAL
  Filled 2012-05-30 (×5): qty 1

## 2012-05-30 MED ORDER — SODIUM CHLORIDE 0.9 % IV SOLN
500.0000 mg | Freq: Two times a day (BID) | INTRAVENOUS | Status: DC
  Administered 2012-05-31: 500 mg via INTRAVENOUS
  Filled 2012-05-30 (×2): qty 5

## 2012-05-30 NOTE — Progress Notes (Signed)
EEG completed.

## 2012-05-30 NOTE — Evaluation (Signed)
SLP reviewed and agree with student findings.   Kairee Isa MA, CCC-SLP [phone]    

## 2012-05-30 NOTE — Progress Notes (Signed)
NUTRITION FOLLOW UP  Pt meets criteria for SEVERE MALNUTRITION in the context of chronic illness as evidenced by 10% weight loss x 2 months, and severe fat and muscle loss.  Intervention:   1. Ensure Complete po BID, each supplement provides 350 kcal and 13 grams of protein.  2. Magic cup TID between meals, each supplement provides 290 kcal and 9 grams of protein.  Nutrition Dx:   Inadequate oral intake now related to poor appetite as evidenced by only bites at meal; ongoing.   Goal:   Pt to meet >/= 90% of their estimated nutrition needs; not met.   Monitor:   PO intake, supplement acceptance, weight trend  Assessment:   Pt admitted for possible stroke which has been ruled out. Per MD pt likely with HTN encephalopathy. Pt passed swallow evaluation.  Pt discussed during ICU rounds and with RN.  Per RN pt would only eat a couple of bites then pt refused to eat anymore stating that food did not taste good.   Height: Ht Readings from Last 1 Encounters:  05/28/12 4\' 6"  (1.372 m)    Weight Status:   Wt Readings from Last 1 Encounters:  05/30/12 76 lb 8 oz (34.7 kg)  Admission weight 75 lb  Re-estimated needs:  Kcal: 1200-1400  Protein: 55-70  Fluid: > 1.5 L/day  Skin: laceration  Diet Order: Dysphagia 3 with Thin Liquids Meal Completion: 0%    Intake/Output Summary (Last 24 hours) at 05/30/12 1243 Last data filed at 05/30/12 1200  Gross per 24 hour  Intake    605 ml  Output   1500 ml  Net   -895 ml    Last BM: PTA   Labs:   Recent Labs Lab 05/28/12 1439 05/28/12 1828 05/29/12 0530 05/30/12 0314  NA 137 137 137 138  K 3.5 3.7 4.0 3.7  CL 96 97 103 104  CO2  --  26 30 28   BUN 9 13 19  24*  CREATININE 0.80 0.66 0.83 0.97  CALCIUM  --  9.7 9.2 8.9  MG  --  1.6 1.6  --   PHOS  --  4.2 3.6  --   GLUCOSE 271* 301* 103* 75    CBG (last 3)   Recent Labs  05/30/12 0420 05/30/12 0745 05/30/12 1152  GLUCAP 73 79 104*    Scheduled Meds: .  ampicillin-sulbactam (UNASYN) IV  1.5 g Intravenous Once  . ampicillin-sulbactam (UNASYN) IV  1.5 g Intravenous Q12H  . heparin  5,000 Units Subcutaneous Q8H  . insulin aspart  2-6 Units Subcutaneous Q4H  . white petrolatum        Continuous Infusions: . sodium chloride      Kendell Bane RD, LDN, CNSC [phone] Pager [phone] After Hours Pager

## 2012-05-30 NOTE — Procedures (Signed)
History: 79 yo F with intermittent episodes of aphasia.   Background: The EEG begins in sleep with generalized delta and sleep structures. This EEG recorded predominantly sleep, but during brief periods of wakefulness, her delta activity did appear to dissipate and a posterior rhythm of 8 Hz was observed.   Photic stimulation: Physiologic driving is not performed  EEG Abnormalities: None  Clinical Interpretation: This normal EEG is recorded predominantly in the sleep state, though brief periods of wakefulness were captured. There was no seizure or seizure predisposition recorded on this study.   [name], MD [facility] [phone]  If 7pm- 7am, please page neurology on call at [phone].

## 2012-05-30 NOTE — Progress Notes (Signed)
PULMONARY  / CRITICAL CARE MEDICINE  Name: Julia [name] MRN: [id number] DOB: [birth year]    ADMISSION DATE:  05/28/2012 CONSULTATION DATE:  05/28/2012  REFERRING MD :  EDP PRIMARY SERVICE: PCCM  CHIEF COMPLAINT:  Unresponsive and HTN.  BRIEF PATIENT DESCRIPTION: 79 yo female  Adm 4/15 with altered mental status She was at home on the day of admission. Spoke to her son by phone with conversation ending at 33 and seemed her usual self. At 1330, son went to her home and found her down disheveled. BIBEMS to ED 1416 with the understanding that she was last seen well at 0800. ED RN notified stroke team, and they arrived in ED at 1422. Head CT was ordered; however she was mute and extremely agitated and she was give Ativan 1 mg IV which had little effect and was repeated. CT was unremarkable. Pt was unrepsonsive after the second dose of ativan. It was decided to do a CTA and CT of cervical spine. She tolerated those procedures well. Pt remained unresponsive.  BP was 256/70.  Patient was started on a cardene drip and PCCM was called to admit. PMH - Dm-2, Htn, CVA  SIGNIFICANT EVENTS / STUDIES:  4/15 Head CT negative. 06-24-22 MRI head neg MRI neck - mild chiari malformation  LINES / TUBES: PIV  CULTURES: Blood 4/15>>> Urine 4/15>>> Sputum 4/15>>>  ANTIBIOTICS: Unasyn 4/15>>>   SUBJECTIVE:  afebrile No obvious pain More alert & responsive Needed i/o cath last night  VITAL SIGNS: Temp:  [97.1 F (36.2 C)-98.3 F (36.8 C)] 98 F (36.7 C) (04/17 0700) Pulse Rate:  [55-77] 59 (04/17 0800) Resp:  [15-29] 16 (04/17 0800) BP: (118-177)/(39-119) 149/48 mmHg (04/17 0800) SpO2:  [97 %-100 %] 97 % (04/17 0800) Weight:  [34.7 kg (76 lb 8 oz)] 34.7 kg (76 lb 8 oz) (04/17 0500) HEMODYNAMICS:   VENTILATOR SETTINGS:   INTAKE / OUTPUT: Intake/Output     06/24/22 0701 - 04/17 0700 04/17 0701 - 04/18 0700   I.V. (mL/kg) 420 (12.1) 10 (0.3)   IV Piggyback 100    Total Intake(mL/kg) 520 (15)  10 (0.3)   Urine (mL/kg/hr) 1500 (1.8)    Total Output 1500     Net -980 +10        Urine Occurrence 2 x      PHYSICAL EXAMINATION: General:  Chronically ill appearing elderly female. Neuro:  More responsive,GCS 13, able to say her name, poor word output, power 3+/5,  follows commands HEENT:  [place]/AT, PERRL, EOM-I, -thyromegally and -LAN. Cardiovascular:  RRR, Nl S1/S2, -M/R/G. Lungs:  Coarse BS from the upper airway. Abdomen:  Soft, NT, ND and +BS. Musculoskeletal:  -edema and -tenderness. Skin:  Intact.  LABS:  Recent Labs Lab 05/28/12 1427  05/28/12 1439 05/28/12 1828 05/28/12 1830 05/28/12 1855 05/28/12 1900 Jun 23, 2012 0043 2012-06-23 0530 05/30/12 0314  HGB 14.2  --  14.3 13.8  --   --   --   --  11.6* 11.1*  WBC 14.1*  --   --  20.2*  --   --   --   --  16.6* 9.1  PLT 430*  --   --  386  --   --   --   --  310 228  NA 137  --  137 137  --   --   --   --  137 138  K 3.7  --  3.5 3.7  --   --   --   --  4.0 3.7  CL 94*  --  96 97  --   --   --   --  103 104  CO2 28  --   --  26  --   --   --   --  30 28  GLUCOSE 274*  --  271* 301*  --   --   --   --  103* 75  BUN 11  --  9 13  --   --   --   --  19 24*  CREATININE 0.67  --  0.80 0.66  --   --   --   --  0.83 0.97  CALCIUM 10.0  --   --  9.7  --   --   --   --  9.2 8.9  MG  --   --   --  1.6  --   --   --   --  1.6  --   PHOS  --   --   --  4.2  --   --   --   --  3.6  --   AST 26  --   --  30  --   --   --   --   --   --   ALT 14  --   --  14  --   --   --   --   --   --   ALKPHOS 80  --   --  73  --   --   --   --   --   --   BILITOT 1.6*  --   --  1.5*  --   --   --   --   --   --   PROT 6.5  --   --  5.8*  --   --   --   --   --   --   ALBUMIN 3.2*  --   --  2.8*  --   --   --   --   --   --   APTT 33  --   --  33  --   --   --   --   --   --   INR 0.98  --   --  1.04  --   --   --   --   --   --   LATICACIDVEN  --   --   --   --   --   --  1.4  --   --   --   TROPONINI  --   < >  --   --   --  <0.30  --  <0.30  <0.30  --   PROCALCITON  --   --   --   --  <0.10  --   --   --   --   --   PROBNP  --   --   --   --   --  2859.0*  --   --   --   --   < > = values in this interval not displayed.  Recent Labs Lab 05/29/12 2007 05/29/12 2035 05/30/12 0006 05/30/12 0420 05/30/12 0745  GLUCAP 68* 166* 86 73 79    CXR: No aspiration.  ASSESSMENT / PLAN:  PULMONARY A: Able to protect airway  P:   - Titrate O2 Minor  CARDIOVASCULAR A: HTN. Echo - mod MR, nml  LVEF, RVSP 41 P:  - Off Cardene drip, use labetalol prn - Await carotid dopplers. - Continue ASA.   RENAL A:  Mild hypokalemia. P:   - replaced  GASTROINTESTINAL A:  No active issues. P:   - bedside swallow eval & resume PO if able   HEMATOLOGIC A:  Leukocytosis.  ?aspiration. P:  - Daily CBC. - See ID section.  INFECTIOUS A:  Aspiration PNA.  CXR clear but patient was witnessed aspirating in ED after vomiting. P:   -await culture. - Unasyn x 5ds  ENDOCRINE A:  DM history.   P:   - CBG and ISS.  NEUROLOGIC A:  Likely HTN encephalopathy - no evidence of PRES or  ischemic CVA on MRI. P:   - Await EEG,  neuro following - Strict BP control as per neuro.  D/w patient's son , patient is no CPR/Cardioversion/Intubation so limited code  OK to transfer to tele/ neuro floor  Dianna Deshler V.  [phone]  05/30/2012, 8:36 AM

## 2012-05-30 NOTE — ED Provider Notes (Signed)
I have personally seen and examined the patient.  I have discussed plan of care with resident.  I was present for key and critical portions of procedure as documented. I have reviewed the appropriate documentation on PMH/FH/Soc. History.  I have reviewed the documentation of the resident and agree.  Arterial line was supervised by dr bednar as well.    Date: 05/28/2012  Rate: 60  Rhythm:sinus  QRS Axis: normal  Intervals:normal  ST/T Wave abnormalities: t wave abnormality noted  Conduction Disutrbances:none  Narrative Interpretation:   Old EKG Reviewed: changes noted    Joya Gaskins, MD 05/30/12 [phone]

## 2012-05-30 NOTE — Evaluation (Signed)
Clinical/Bedside Swallow Evaluation Patient Details  Name: Julia [name] MRN: [id number] Date of Birth: 22-Dec-1927  Today's Date: 05/30/2012 Time: 4098-1191 SLP Time Calculation (min): 18 min  Past Medical History:  Past Medical History  Diagnosis Date  . Hypertension   . GERD (gastroesophageal reflux disease)   . Edema leg 12/06/2011  . Hyperlipidemia 12/06/2011  . CAD (coronary artery disease), severe single vessel LAD disease, nl. EF 12/08/2011    Unsuccessful PCI  . Shortness of breath     "all the time since last Friday" (12/12/2011)  . Type II diabetes mellitus   . CKD (chronic kidney disease) stage 2, GFR 60-89 ml/min 12/06/2011  . Stroke     "several small strokes"; denies residual  (12/12/2011)  . Arthritis     "all my joints" (12/12/2011)   Past Surgical History:  Past Surgical History  Procedure Laterality Date  . Pseudoaneurysm repair  12/12/2011    "no surgery; did ultrasonic; right femoral compression"  . Appendectomy    . Cholecystectomy    . Colectomy      "took out 27 1/2 inches" (12/12/2011)  . Lumbar disc surgery      "2 discs removed, spacer put in" (12/12/2011)  . Carpal tunnel release      bilaterally  . Hammer toe surgery      right  . Abdominal hysterectomy    . Back surgery    . Coronary angioplasty with stent placement  12/08/2011    "failed"  . Cardiac catheterization  12/07/2011   HPI:  79 yo female Adm 4/15 with altered mental status. Pt admitted for possible stroke which has been ruled out. Per MD pt likely with HTN encephalopathy. Head CT was ordered; however she was mute and extremely agitated and she was given Ativan 1 mg IV which had little effect and was repeated. CT was unremarkable.   Assessment / Plan / Recommendation Clinical Impression  Patient presents with a functional swallow with no overt s/s of aspiration observed at bedside. SLP noted dark lingual coating, RN aware and oral care has been performed. Prolonged  mastication was observed with mechanical soft po, however appears to be due to pt dislike. Pt c/o of all po's tasting bad (due to lingual coating?), SLP provided mod-max verbal cues for pt to participate in po trials. As evaluation progressed, pt reported she was going to get sick if she kept eating. SLP offered several po options of different taste, pt refused. Recommend dys-3 with thin liquids with intermittent supervision, communicated with RN. RN aware of pt's overall disinterest in po's during evaluation. No f/u needed at this time while in acute setting.    Aspiration Risk  Mild    Diet Recommendation Dysphagia 3 (Mechanical Soft);Thin liquid   Liquid Administration via: Cup;Straw Medication Administration: Whole meds with liquid Supervision: Patient able to self feed;Intermittent supervision to cue for compensatory strategies Compensations: Slow rate;Small sips/bites Postural Changes and/or Swallow Maneuvers: Seated upright 90 degrees    Other  Recommendations Oral Care Recommendations: Oral care QID   Follow Up Recommendations  None    Frequency and Duration        Pertinent Vitals/Pain None reported    SLP Swallow Goals     Swallow Study Prior Functional Status       General Date of Onset: 05/28/12 HPI: 79 yo female Adm 4/15 with altered mental status. Pt admitted for possible stroke which has been ruled out. Per MD pt likely with HTN encephalopathy. Head CT was  ordered; however she was mute and extremely agitated and she was given Ativan 1 mg IV which had little effect and was repeated. CT was unremarkable. Type of Study: Bedside swallow evaluation Previous Swallow Assessment: failed stroke swallow screen Diet Prior to this Study: NPO Temperature Spikes Noted: No Respiratory Status: Room air History of Recent Intubation: No Behavior/Cognition: Cooperative;Lethargic Oral Cavity - Dentition: Missing dentition (bottom) Self-Feeding Abilities: Able to feed self;Needs  assist Patient Positioning: Upright in bed Baseline Vocal Quality: Clear;Low vocal intensity Volitional Cough: Cognitively unable to elicit Volitional Swallow: Unable to elicit    Oral/Motor/Sensory Function Overall Oral Motor/Sensory Function: Other (comment) (Pt would not participate on oral motor exam despite max cues)   Ice Chips Ice chips: Within functional limits Presentation: Spoon   Thin Liquid Thin Liquid: Within functional limits Presentation: Cup;Straw;Self Fed    Nectar Thick Nectar Thick Liquid: Not tested   Honey Thick Honey Thick Liquid: Not tested   Puree Puree: Within functional limits Presentation: Self Fed;Spoon   Solid   GO   Berdine Dance SLP student Solid: Within functional limits (prolonged mastication) Presentation: Self Fed       Olinda Nola 05/30/2012,9:50 AM

## 2012-05-30 NOTE — Progress Notes (Addendum)
Subjective: Speaking more than yesterday, also following commands. Denies neck pain.  Exam: Filed Vitals:   05/30/12 0800  BP: 149/48  Pulse: 59  Temp:   Resp: 16   Gen: In bed, NAD Neck: no tenderness with movement or to palpation.  MS: Awake, follows commands immediately. States " I don't know" when asked her name and is unable to tell me what building she is in, but does tell me that she is in [place] [id number], fixates and tracks across midline.  Motor: follows commands x4 Sensory:responds to nox stim x 4.   Impression: 79 yo F presenting with altered mental status. Her exam yesterday was more convincing for aphasia, but today could be consistent with mild aphasia or delirium. Her MRI does not show evidence of stroke and she is improving clinically. I suspect a seizure as the precipitating event, but her continued confusion could be related to seizure or hospital related delirium.   Recommendations: 1) EEG today 2) Continue supportive care.   [name], MD [facility] [phone]  If 7pm- 7am, please page neurology on call at [phone].    Patient with waxing and waning exam. It is still possible that this represents improving delirium that is waxing and waning, but given presenting history and continue waxing and waning, I feel that it would be reasonable to do a trial of keppra to see if her mentation becomes consistently better  [name], MD [facility] [phone]  If 7pm- 7am, please page neurology on call at [phone].

## 2012-05-31 ENCOUNTER — Inpatient Hospital Stay (HOSPITAL_COMMUNITY): Payer: Medicare Other

## 2012-05-31 LAB — BASIC METABOLIC PANEL
GFR calc Af Amer: 68 mL/min — ABNORMAL LOW (ref >90)
GFR calc non Af Amer: 59 mL/min — ABNORMAL LOW (ref >90)
Potassium: 2.8 mEq/L — ABNORMAL LOW (ref 3.5–5.1)
Sodium: 141 mEq/L (ref 135–145)

## 2012-05-31 LAB — CBC
Hemoglobin: 9.7 g/dL — ABNORMAL LOW (ref 12.0–15.0)
RBC: 3.2 MIL/uL — ABNORMAL LOW (ref 3.87–5.11)
WBC: 6.6 10*3/uL (ref 4.0–10.5)

## 2012-05-31 LAB — GLUCOSE, CAPILLARY: Glucose-Capillary: 168 mg/dL — ABNORMAL HIGH (ref 70–99)

## 2012-05-31 MED ORDER — SODIUM CHLORIDE 0.9 % IV SOLN
750.0000 mg | Freq: Two times a day (BID) | INTRAVENOUS | Status: DC
  Administered 2012-05-31 – 2012-06-01 (×2): 750 mg via INTRAVENOUS
  Filled 2012-05-31 (×4): qty 7.5

## 2012-05-31 MED ORDER — IOHEXOL 300 MG/ML  SOLN
60.0000 mL | Freq: Once | INTRAMUSCULAR | Status: AC | PRN
  Administered 2012-05-31: 60 mL via INTRAVENOUS

## 2012-05-31 MED ORDER — IOHEXOL 300 MG/ML  SOLN
25.0000 mL | INTRAMUSCULAR | Status: AC
  Administered 2012-05-31 (×2): 25 mL via ORAL

## 2012-05-31 MED ORDER — POTASSIUM CHLORIDE CRYS ER 20 MEQ PO TBCR
40.0000 meq | EXTENDED_RELEASE_TABLET | Freq: Every day | ORAL | Status: DC
  Administered 2012-05-31 – 2012-06-01 (×2): 40 meq via ORAL
  Filled 2012-05-31 (×2): qty 2

## 2012-05-31 NOTE — Evaluation (Signed)
Physical Therapy Evaluation Patient Details Name: Julia [name] MRN: [id number] DOB: 27-Sep-1927 Today's Date: 05/31/2012 Time: 4098-1191 PT Time Calculation (min): 50 min  PT Assessment / Plan / Recommendation Clinical Impression  pt adm with ?seizure by ECG, HTN, periods of unresponsiveness,  On admission, periods of confusion, speech difficulty.  On eval,  pt is weak with little tolerance for activity.  One of the primary limitations to mobility is vertigo, specifically BPPV.  Conducted a brief Vestibular Assessment.--  Answers to questioning led me to suspect BPPV.  Hallpike-Dix produced nystagmus on the the R side suggesting post. BPPV R.  Questionable Nystagmus to the L, but technically bad test.  Will have PT see and reassess on 4/19.  Treated R side with the Epply maneuver and will also have therapy reassess 4/19.    PT Assessment  Patient needs continued PT services    Follow Up Recommendations  Outpatient PT;Other (comment) (vestibular therapy, EENT may be in order)    Does the patient have the potential to tolerate intense rehabilitation      Barriers to Discharge        Equipment Recommendations  Other (comment) (TBD)    Recommendations for Other Services     Frequency Min 3X/week    Precautions / Restrictions Precautions Precautions: Fall   Pertinent Vitals/Pain       Mobility  Bed Mobility Bed Mobility: Rolling Right;Right Sidelying to Sit;Sitting - Scoot to [place] of Bed;Sit to Sidelying Left Rolling Right: 4: Min assist Right Sidelying to Sit: 3: Mod assist;HOB flat Sitting - Scoot to Edge of Bed: 4: Min assist;3: Mod assist Sit to Sidelying Left: 3: Mod assist Details for Bed Mobility Assistance: vc's for direction; assist in part due to dizziness/? dysequilibrium Transfers Transfers: Sit to Stand;Stand to Sit Sit to Stand: 4: Min assist;From bed;With upper extremity assist Stand to Sit: 4: Min assist;With upper extremity assist;To bed Details for  Transfer Assistance: vc's for hand placement./ safey--pt reported dizziness throughout Ambulation/Gait Ambulation/Gait Assistance: 4: Min assist Ambulation Distance (Feet): 12 Feet (around the bed) Assistive device: 1 person hand held assist Ambulation/Gait Assistance Details: unsteady throughout short walk around the bed.  Pt reported dizziness through entire walk Gait Pattern: Step-through pattern;Decreased step length - right;Decreased step length - left;Scissoring;Narrow base of support Stairs: No Wheelchair Mobility Wheelchair Mobility: No    Exercises     PT Diagnosis: Difficulty walking;Generalized weakness;Other (comment) (Vertigo)  PT Problem List: Decreased strength;Decreased activity tolerance;Decreased balance;Other (comment) (BPPV) PT Treatment Interventions: Gait training;Functional mobility training;Therapeutic activities;Balance training;Patient/family education;Other (comment) (Vestibular interventions)   PT Goals Acute Rehab PT Goals PT Goal Formulation: With patient/family Time For Goal Achievement: 06/14/12 Potential to Achieve Goals: Good Additional Goals Additional Goal #1: reassess pt for L or R BPPV and treat as necessary to reach <3/10 intensity vertigo PT Goal: Additional Goal #1 - Progress: Goal set today Additional Goal #2: Side to sit with min guard and ,3/10 s/s of vertigo PT Goal: Additional Goal #2 - Progress: Goal set today Additional Goal #3: amb >50 feet with least restrictive device min guard and s/s of vertigo <3/10 PT Goal: Additional Goal #3 - Progress: Goal set today  Visit Information  Last PT Received On: 05/31/12 Assistance Needed: +1    Subjective Data  Subjective: No, the dizziness has been there before all these falls. Patient Stated Goal: Be able to get back home.   Prior Functioning  Home Living Lives With: Son Available Help at Discharge: Family;Available PRN/intermittently Type of  Home: House Home Access: Stairs to  enter Secretary/administrator of Steps: 3 Entrance Stairs-Rails: None Home Layout: One level Bathroom Shower/Tub: Forensic psychologist: None Prior Function Level of Independence: Independent Able to Take Stairs?: Yes Driving: No Communication Communication: No difficulties Dominant Hand: Right    Cognition  Cognition Arousal/Alertness: Awake/alert Behavior During Therapy: WFL for tasks assessed/performed Overall Cognitive Status: Within Functional Limits for tasks assessed    Extremity/Trunk Assessment Right Upper Extremity Assessment RUE ROM/Strength/Tone: [facility] for tasks assessed Left Upper Extremity Assessment LUE ROM/Strength/Tone: WFL for tasks assessed Right Lower Extremity Assessment RLE ROM/Strength/Tone: WFL for tasks assessed (bil weak but functional) Left Lower Extremity Assessment LLE ROM/Strength/Tone: WFL for tasks assessed   Balance Balance Balance Assessed: Yes Static Sitting Balance Static Sitting - Balance Support: Feet supported;Feet unsupported;Left upper extremity supported;Right upper extremity supported Static Sitting - Level of Assistance: 4: Min assist;5: Stand by assistance;Other (comment) (varying based on how dizzy)  End of Session PT - End of Session Activity Tolerance: Patient tolerated treatment well;Other (comment) (limited by dizziness/vertigo) Patient left: in bed;with call bell/phone within reach;with family/visitor present Nurse Communication: Mobility status;Other (comment) (BPPV found and treated)  GP     Ethanjames Fontenot, Eliseo Gum 05/31/2012, 5:48 PM 05/31/2012  [place] Bing, PT [phone] (pager)

## 2012-05-31 NOTE — Progress Notes (Signed)
ANTIBIOTIC CONSULT NOTE - FOLLOW UP  Pharmacy Consult for Unasyn Indication: aspiration pneumonia  No Known Allergies  Patient Measurements: Height: 4\' 6"  (137.2 cm) Weight: 65 lb 7 oz (29.682 kg) IBW/kg (Calculated) : 31.7  Vital Signs: Temp: 98.4 F (36.9 C) (04/18 0945) Temp src: Oral (04/18 0945) BP: 153/47 mmHg (04/18 0945) Pulse Rate: 63 (04/18 0604) Intake/Output from previous day: 04/17 0701 - 04/18 0700 In: 790 [P.O.:150; I.V.:375; IV Piggyback:265] Out: 1730 [Urine:1730] Intake/Output from this shift: Total I/O In: 120 [P.O.:120] Out: -   Labs:  Recent Labs  05/29/12 0530 05/30/12 0314 05/31/12 0430  WBC 16.6* 9.1 6.6  HGB 11.6* 11.1* 9.7*  PLT 310 228 237  CREATININE 0.83 0.97 0.89   Estimated Creatinine Clearance: 22.8 ml/min (by C-G formula based on Cr of 0.89). No results found for this basename: VANCOTROUGH, Leodis Binet, VANCORANDOM, GENTTROUGH, GENTPEAK, GENTRANDOM, TOBRATROUGH, TOBRAPEAK, TOBRARND, AMIKACINPEAK, AMIKACINTROU, AMIKACIN,  in the last 72 hours   Microbiology: Recent Results (from the past 720 hour(s))  CULTURE, BLOOD (ROUTINE X 2)     Status: None   Collection Time    05/28/12  6:50 PM      Result Value Range Status   Specimen Description BLOOD HAND RIGHT   Final   Special Requests BOTTLES DRAWN AEROBIC AND ANAEROBIC 10CC   Final   Culture  Setup Time 05/29/2012 01:41   Final   Culture     Final   Value:        BLOOD CULTURE RECEIVED NO GROWTH TO DATE CULTURE WILL BE HELD FOR 5 DAYS BEFORE ISSUING A FINAL NEGATIVE REPORT   Report Status PENDING   Incomplete  CULTURE, BLOOD (ROUTINE X 2)     Status: None   Collection Time    05/28/12  7:15 PM      Result Value Range Status   Specimen Description BLOOD HAND RIGHT   Final   Special Requests BOTTLES DRAWN AEROBIC AND ANAEROBIC 10CC   Final   Culture  Setup Time 05/29/2012 01:41   Final   Culture     Final   Value:        BLOOD CULTURE RECEIVED NO GROWTH TO DATE CULTURE WILL BE  HELD FOR 5 DAYS BEFORE ISSUING A FINAL NEGATIVE REPORT   Report Status PENDING   Incomplete  MRSA PCR SCREENING     Status: None   Collection Time    05/28/12  8:17 PM      Result Value Range Status   MRSA by PCR NEGATIVE  NEGATIVE Final   Comment:            The GeneXpert MRSA Assay (FDA     approved for NASAL specimens     only), is one component of a     comprehensive MRSA colonization     surveillance program. It is not     intended to diagnose MRSA     infection nor to guide or     monitor treatment for     MRSA infections.  URINE CULTURE     Status: None   Collection Time    05/29/12  4:31 PM      Result Value Range Status   Specimen Description URINE, CLEAN CATCH   Final   Special Requests Normal   Final   Culture  Setup Time 05/29/2012 17:25   Final   Colony Count NO GROWTH   Final   Culture NO GROWTH   Final   Report Status 05/30/2012  FINAL   Final    Anti-infectives   Start     Dose/Rate Route Frequency Ordered Stop   05/29/12 0600  ampicillin-sulbactam (UNASYN) 1.5 g in sodium chloride 0.9 % 50 mL IVPB     1.5 g 100 mL/hr over 30 Minutes Intravenous Every 12 hours 05/28/12 1838     05/28/12 1845  ampicillin-sulbactam (UNASYN) 1.5 g in sodium chloride 0.9 % 50 mL IVPB  Status:  Discontinued     1.5 g 100 mL/hr over 30 Minutes Intravenous  Once 05/28/12 1838 05/31/12 1152      Assessment: 79 y/o female on day 3 Unasyn for aspiration PNA. First dose due on 4/15 was not given. Cultures are ngtd, patient is afebrile, and WBC are normal.   Goal of Therapy:  pneumonia resolution  Plan:  -Continue Unasyn 1.5 g IV q12h -MD, please set stop date -Will follow-up in am  [facility], [place].D., BCPS Clinical Pharmacist Pager: [phone] 05/31/2012 11:59 AM

## 2012-05-31 NOTE — Progress Notes (Signed)
Subjective: Patient awakens easily, converses seems appropriate recognizes me. She is tired but has no frank complaints  Objective: Vital signs in last 24 hours: Temp:  [97.8 F (36.6 C)-98.5 F (36.9 C)] 98.4 F (36.9 C) (04/18 0945) Pulse Rate:  [55-111] 63 (04/18 0604) Resp:  [14-20] 14 (04/18 0945) BP: (125-177)/(47-71) 153/47 mmHg (04/18 0945) SpO2:  [95 %-100 %] 96 % (04/18 0945) Weight:  [29.682 kg (65 lb 7 oz)-34.7 kg (76 lb 8 oz)] 29.682 kg (65 lb 7 oz) (04/18 0458) Weight change: 0 kg (0 lb)  CBG (last 3)   Recent Labs  05/31/12 0013 05/31/12 0432 05/31/12 0758  GLUCAP 108* 94 112*    Intake/Output from previous day: 04/17 0701 - 04/18 0700 In: 790 [P.O.:150; I.V.:375; IV Piggyback:265] Out: 1730 [Urine:1730]  Physical Exam: Patient is awake and alert bitemporal wasting good facial symmetry. No JVD or bruits. Lungs are clear. Cardiovascular distant heart sounds regular no murmur. Abdomen soft and nontender. No peripheral edema intact pulses. Neurologically conversant appropriate nonlateralizing   Lab Results:  Recent Labs  05/28/12 1828 05/29/12 0530 05/30/12 0314 05/31/12 0430  NA 137 137 138 141  K 3.7 4.0 3.7 2.8*  CL 97 103 104 102  CO2 26 30 28 30   GLUCOSE 301* 103* 75 103*  BUN 13 19 24* 19  CREATININE 0.66 0.83 0.97 0.89  CALCIUM 9.7 9.2 8.9 8.7  MG 1.6 1.6  --   --   PHOS 4.2 3.6  --   --     Recent Labs  05/28/12 1427 05/28/12 1828  AST 26 30  ALT 14 14  ALKPHOS 80 73  BILITOT 1.6* 1.5*  PROT 6.5 5.8*  ALBUMIN 3.2* 2.8*    Recent Labs  05/28/12 1427  05/30/12 0314 05/31/12 0430  WBC 14.1*  < > 9.1 6.6  NEUTROABS 12.0*  --   --   --   HGB 14.2  < > 11.1* 9.7*  HCT 39.9  < > 31.5* 27.7*  MCV 86.9  < > 86.8 86.6  PLT 430*  < > 228 237  < > = values in this interval not displayed. Lab Results  Component Value Date   INR 1.04 05/28/2012   INR 0.98 05/28/2012   INR 0.97 01/27/2012    Recent Labs  05/28/12 1855  05/29/12 0043 05/29/12 0530  TROPONINI <0.30 <0.30 <0.30   No results found for this basename: TSH, T4TOTAL, FREET3, T3FREE, THYROIDAB,  in the last 72 hours No results found for this basename: VITAMINB12, FOLATE, FERRITIN, TIBC, IRON, RETICCTPCT,  in the last 72 hours  Studies/Results: Mr Laqueta Jean Wo Contrast  05/30/2012  *RADIOLOGY REPORT*  Clinical Data: Fall.  Altered mental status  MRI HEAD WITHOUT AND WITH CONTRAST  Technique:  Multiplanar, multiecho pulse sequences of the brain and surrounding structures were obtained according to standard protocol without and with intravenous contrast  Contrast: 6mL MULTIHANCE GADOBENATE DIMEGLUMINE 529 MG/ML IV SOLN  Comparison: MRI 05/06/2010.  CT head 05/28/2012  Findings: Generalized atrophy, typical for age.  Mild Chiari malformation.  Cerebellar tonsils are 7 mm below the foramen magnum.  This is unchanged from prior studies.  Marked hypertrophy of the transverse ligament of C1 as noted in the cervical spine MRI report from today.  Chronic microvascular ischemic changes are present in the white matter bilaterally.  Mild chronic ischemia in the basal ganglia and pons.  No acute infarct.  Negative for intracranial hemorrhage.  Negative for fluid collection or midline shift.  Mucosal thickening in the paranasal sinuses, mild.  Postcontrast imaging reveals normal enhancement.  No mass lesion is identified.  IMPRESSION: No acute intracranial abnormality.  Mild Chiari malformation.  Mild chronic microvascular ischemia.   Original Report Authenticated By: Janeece Riggers, M.D.    Mr Cervical Spine Wo Contrast  05/30/2012  *RADIOLOGY REPORT*  Clinical Data: Fall.  MRI CERVICAL SPINE WITHOUT CONTRAST  Technique:  Multiplanar and multiecho pulse sequences of the cervical spine, to include the craniocervical junction and cervicothoracic junction, were obtained according to standard protocol without intravenous contrast.  Comparison:  CT cervical 05/28/2012  Findings: The  patient was not able to tolerate postcontrast imaging.  There is mild motion on the study.  Cerebellar tonsils are low-lying approximately 8 mm below the foramen magnum.  At C1-2, there is extensive pannus formation and degenerative change.  There is thickening of the transverse ligament which is touching the craniocervical junction.  No compression of the cord.  Negative for fracture or mass lesion.  No bone marrow or soft tissue edema is identified.  Mild disc and facet degeneration is present throughout the cervical spine.  No acute disc protrusion.  No intraspinal hematoma. Negative for cord compression.  No focal cord edema.  IMPRESSION: Mild Chiari malformation  Extensive pannus formation involving C1-C2.  The pannus is touching the spinal cord but not causing significant stenosis.  Negative for fracture.  No acute disc protrusion or hematoma.   Original Report Authenticated By: Janeece Riggers, M.D.      Assessment/Plan: #1 hypertensive crisis status post cardiac drip currently acceptable control  #2 hypertensive encephalopathy gradually clearing no evidence of a vascular insult  #3 diabetes mellitus type 2 currently diet controlled on sliding scale insulin  #4 protein/calorie malnutrition secondary to failure to thrive and anorexia with GI workup underway as an outpatient  Plan we'll supplement potassium, scan abdomen looking for occult malignancy, continue therapies   LOS: 3 days   Nylah Butkus A 05/31/2012, 11:43 AM

## 2012-05-31 NOTE — Progress Notes (Signed)
Subjective: Speech much more clear today. She has had several episodes since admission  Exam: Filed Vitals:   05/31/12 0629  BP: 162/50  Pulse:   Temp: 98.5 F (36.9 C)  Resp: 16   Gen: In bed, NAD Neck: no tenderness with movement or to palpation.  MS: Awake, follows commands immediately. States " I don't know" when asked her name and is unable to tell me what building she is in, but does tell me that she is in [place] [id number], fixates and tracks across midline.  Motor: follows commands x4 Sensory:responds to nox stim x 4.   Impression: 79 yo F presenting with repeated episodes of altered mental status. I suspect that she has been having seizures. The possibility of stuttering TIAs is there, but I feel that this is much less likely. She is currently on antiplatelet therapy with aspirin. Her EEG did not show any evidence of seizure predisposition, but this is not unusual in seizures starting at her age. She has a history of mild dementia, and was treated for it briefly but stopped due to side effects. This could be an etiology.  Recommendations: 1) she had an episode yesterday after initial load of Keppra, therefore I've increased her Keppra to 750 twice a day 2) if she were to have further episodes, I would consider doing continuous monitoring to confirm the diagnosis. 3) continue ASA  [name], MD [facility] [phone]  If 7pm- 7am, please page neurology on call at [phone].

## 2012-06-01 LAB — GLUCOSE, CAPILLARY
Glucose-Capillary: 390 mg/dL — ABNORMAL HIGH (ref 70–99)
Glucose-Capillary: 209 mg/dL — ABNORMAL HIGH (ref 70–99)

## 2012-06-01 MED ORDER — GLUCERNA SHAKE PO LIQD
237.0000 mL | Freq: Three times a day (TID) | ORAL | Status: DC
  Administered 2012-06-01 – 2012-06-03 (×6): 237 mL via ORAL

## 2012-06-01 MED ORDER — ACETAMINOPHEN 325 MG PO TABS
650.0000 mg | ORAL_TABLET | ORAL | Status: DC | PRN
  Administered 2012-06-01 – 2012-06-02 (×2): 650 mg via ORAL
  Filled 2012-06-01: qty 2

## 2012-06-01 MED ORDER — LEVETIRACETAM 750 MG PO TABS
750.0000 mg | ORAL_TABLET | Freq: Two times a day (BID) | ORAL | Status: DC
  Administered 2012-06-01 – 2012-06-03 (×5): 750 mg via ORAL
  Filled 2012-06-01 (×6): qty 1

## 2012-06-01 MED ORDER — INSULIN ASPART 100 UNIT/ML ~~LOC~~ SOLN
0.0000 [IU] | Freq: Three times a day (TID) | SUBCUTANEOUS | Status: DC
  Administered 2012-06-01: 9 [IU] via SUBCUTANEOUS
  Administered 2012-06-02: 2 [IU] via SUBCUTANEOUS
  Administered 2012-06-02: 3 [IU] via SUBCUTANEOUS
  Administered 2012-06-03: 2 [IU] via SUBCUTANEOUS

## 2012-06-01 NOTE — Progress Notes (Signed)
Subjective: Doing well just finishing a bath.  By mouth intake spare.  Awaiting therapy today.  Denies any chest pain or shortness of breath.  Interacts appropriately.  Objective: Vital signs in last 24 hours: Temp:  [97.9 F (36.6 C)-98.4 F (36.9 C)] 97.9 F (36.6 C) (04/19 1356) Pulse Rate:  [53-79] 58 (04/19 1356) Resp:  [14-18] 16 (04/19 1356) BP: (141-199)/(53-64) 141/57 mmHg (04/19 1356) SpO2:  [95 %-100 %] 99 % (04/19 1356) Weight:  [31.752 kg (70 lb)] 31.752 kg (70 lb) (04/19 0500) Weight change: -2.948 kg (-6 lb 8 oz)  CBG (last 3)   Recent Labs  06/01/12 0425 06/01/12 0751 06/01/12 1148  GLUCAP 106* 105* 311*    Intake/Output from previous day: 04/18 0701 - 04/19 0700 In: 1077.5 [P.O.:720; I.V.:250; IV Piggyback:107.5] Out: 2100 [Urine:2100]  Physical Exam:  Patient is awake and alert bitemporal wasting good facial symmetry. No JVD or bruits. Lungs are clear. Cardiovascular distant heart sounds regular no murmur. Abdomen soft and nontender. No peripheral edema intact pulses. Neurologically conversant appropriate nonlateralizing      Lab Results:  Recent Labs  05/30/12 0314 05/31/12 0430  NA 138 141  K 3.7 2.8*  CL 104 102  CO2 28 30  GLUCOSE 75 103*  BUN 24* 19  CREATININE 0.97 0.89  CALCIUM 8.9 8.7   No results found for this basename: AST, ALT, ALKPHOS, BILITOT, PROT, ALBUMIN,  in the last 72 hours  Recent Labs  05/30/12 0314 05/31/12 0430  WBC 9.1 6.6  HGB 11.1* 9.7*  HCT 31.5* 27.7*  MCV 86.8 86.6  PLT 228 237   Lab Results  Component Value Date   INR 1.04 05/28/2012   INR 0.98 05/28/2012   INR 0.97 01/27/2012   No results found for this basename: CKTOTAL, CKMB, CKMBINDEX, TROPONINI,  in the last 72 hours No results found for this basename: TSH, T4TOTAL, FREET3, T3FREE, THYROIDAB,  in the last 72 hours No results found for this basename: VITAMINB12, FOLATE, FERRITIN, TIBC, IRON, RETICCTPCT,  in the last 72  hours  Studies/Results: Ct Abdomen Pelvis W Contrast  05/31/2012  *RADIOLOGY REPORT*  Clinical Data: Occult malignancy.  CT ABDOMEN AND PELVIS WITH CONTRAST  Technique:  Multidetector CT imaging of the abdomen and pelvis was performed following the standard protocol during bolus administration of intravenous contrast.  Contrast: 60mL OMNIPAQUE IOHEXOL 300 MG/ML  SOLN  Comparison: None.  Findings: Small bilateral pleural effusions with bibasilar atelectasis.  Heart is mildly enlarged.  Prior cholecystectomy.  Liver, pancreas, adrenals and kidneys are unremarkable.  Tiny low density lesion in the inferior tip of the spleen is nonspecific and too small to characterize, but most likely a small cyst or hemangioma.  Foley catheter is in place within the bladder which is filled with air, otherwise grossly unremarkable.  Scattered descending colonic and sigmoid diverticula.  No active diverticulitis.  Small bowel is decompressed.  Prior hysterectomy.  No adnexal masses.  No free fluid, free air or adenopathy.  Aorta and iliac vessels are calcified, non-aneurysmal.  No acute or focal bony abnormality.  Degenerative disc and facet disease throughout the lumbar spine.  IMPRESSION: Small bilateral pleural effusions with bibasilar atelectasis.  Descending colonic and sigmoid diverticulosis.   Original Report Authenticated By: Charlett Nose, M.D.      Assessment/Plan:  1 hypertensive crisis status post cardiac drip currently acceptable control  #2 hypertensive encephalopathy gradually clearing no evidence of a vascular insult  #3 diabetes mellitus type 2 currently diet controlled on  sliding scale insulin  #4 protein/calorie malnutrition secondary to failure to thrive and anorexia with GI workup underway as an outpatient  #5 pneumonia empiric treatment for aspiration #6 seizure-appear treatment underway please see neurology note.  Therapy and continuation of antibiotics.  We'll address disposition in the next 24  hours     LOS: 4 days   Gibson Telleria A 06/01/2012, 1:59 PM

## 2012-06-01 NOTE — Progress Notes (Signed)
Physical Therapy Treatment Patient Details Name: Julia [name] MRN: [id number] DOB: [birth year] Today's Date: 06/01/2012 Time: 4098-1191 PT Time Calculation (min): 30 min  PT Assessment / Plan / Recommendation Comments on Treatment Session  Pt reports she only feels true vertigo/spinning when she moves sit to supine; other times she feels off-balance "like I'm falling." Performed VOR cancellation testing and pt unable to maintain her eyes on the target, which is a sign of central vestibular involvement. Noted per MRI pt has mild Chiari malformation with cerebellar tonsils 7 mm below foramen magnum. ? if this is contrbuting to her sense of being off-balance and nausea. Recommend use of meclizine (if appropriate with other meds/conditions) to assist with decreasing vertigo symptoms.    Follow Up Recommendations  SNF;Supervision/Assistance - 24 hour--appears pt only has intermittent assist on d/c and currently will need 24 hr assist     Does the patient have the potential to tolerate intense rehabilitation     Barriers to Discharge        Equipment Recommendations  Other (comment) (TBD)    Recommendations for Other Services    Frequency Min 3X/week   Plan Discharge plan needs to be updated;Frequency remains appropriate    Precautions / Restrictions Precautions Precautions: Fall   Pertinent Vitals/Pain No pain; + nausea with mobility and vestibular testing    Mobility  Transfers Transfers: Sit to Stand;Stand to Sit Sit to Stand: 4: Min assist Stand to Sit: 4: Min assist Details for Transfer Assistance: vc for hand placement and safe use of RW; pt reports feeling she is falling forwards when achieves standing (and is actually in midline) Ambulation/Gait Ambulation/Gait Assistance: 4: Min assist Ambulation Distance (Feet): 12 Feet Assistive device: Rolling walker Ambulation/Gait Assistance Details: instructed in use of visual compensation to limit head/eye movements while walking.  pt reports this seemed to help a litlle. Denies sense of vertigo with walking, feels off-balance. Gait Pattern: Step-through pattern;Decreased stride length;Shuffle;Trunk flexed Gait velocity: significantly decr            PT Goals Additional Goals Additional Goal #1: reassess pt for L or R BPPV and treat as necessary to reach <3/10 intensity vertigo PT Goal: Additional Goal #1 - Progress: Partly met Additional Goal #3: amb >50 feet with least restrictive device min guard and s/s of vertigo <3/10 PT Goal: Additional Goal #3 - Progress: Progressing toward goal  Visit Information  Last PT Received On: 06/01/12 Assistance Needed: +1    Subjective Data  Subjective: Reports her dizziness is no better today. Reports when she had this is the past, a doctor gave her medicine which made it betterl Patient Stated Goal: Be able to get back home.   Cognition  Cognition Arousal/Alertness: Awake/alert Behavior During Therapy: WFL for tasks assessed/performed Overall Cognitive Status: Within Functional Limits for tasks assessed    Balance     End of Session PT - End of Session Equipment Utilized During Treatment: Gait belt Activity Tolerance: Treatment limited secondary to medical complications (Comment) (dizziness and nausea) Patient left: in chair;with call bell/phone within reach   GP     Claude Waldman 06/01/2012, 11:47 AM  06/01/2012 Veda Canning, PT Pager: [phone]

## 2012-06-01 NOTE — Progress Notes (Signed)
Subjective: Speech much more clear today. She has had several episodes since admission  Exam: Filed Vitals:   06/01/12 0954  BP: 161/53  Pulse: 79  Temp: 98.1 F (36.7 C)  Resp: 16   Gen: In bed, NAD Neck: no tenderness with movement or to palpation.  MS: Awake, follows commands immediately. Oriented to month, year, [place]. No further episodes since two days ago.  WU:JWJXB, fixates and tracks across midline.  Motor: follows commands x4 Sensory:responds to nox stim x 4.   Impression: 79 yo F presenting with repeated episodes of altered mental status. I suspect that she has been having seizures. The possibility of stuttering TIAs is there, but I feel that this is much less likely. She is currently on antiplatelet therapy with aspirin. Her EEG did not show any evidence of seizure predisposition, but this is not unusual in seizures starting at her age. She has a history of mild dementia, and was treated for it briefly but stopped due to side effects. This could be an etiology. Hypertensive encephalopathy alone I feel would be less likely to cause the sudden worsening of symptoms repeatedly.    Recommendations: 1) Continue keppra 750mg  BID. It could be considered to come off of this in a few months if episode free given that hypertensive encephalopathy could have been reasonable for provoking seizures. I would favor long term AED therapy, however, if the patient tolerates the medication well without side effects given that there were no signs of PRES on MRI and it is more typical to see this when seizures are present.  2) continue ASA 3) She should be referred to neurology as an outpatient, and should remain on keppra at her current dose until that time.  4) No further neurodiagnostic studies needed at this time, neurology will sign off.   [name], MD [facility] [phone]  If 7pm- 7am, please page neurology on call at [phone].

## 2012-06-01 NOTE — Progress Notes (Signed)
Paged on call dr for [facility]. Patient's CBG 311 and her standing insulin order only gives parameters for CBG up to 200.  Awaiting return call.  Patient is asymptomatic.  Lance Bosch, RN

## 2012-06-01 NOTE — Progress Notes (Signed)
Dr. Jacky Kindle phoned and will change orders.  I gave the patient 6 units of insulin based on her current orders.  Lance Bosch, RN

## 2012-06-01 NOTE — Evaluation (Signed)
Occupational Therapy Evaluation Patient Details Name: Julia [name] MRN: [id number] DOB: 1927/07/19 Today's Date: 06/01/2012 Time: 9528-4132 OT Time Calculation (min): 16 min  OT Assessment / Plan / Recommendation Clinical Impression  Pt admitted with AMS and speech difficulty.  MRI showed mild chiari malformation but otherwise negative. Pt experiencing dizziness/ "off balance" sensation which is significantly limiting her. Will continue to follow acutely. REcommending SNF.    OT Assessment  Patient needs continued OT Services    Follow Up Recommendations  SNF    Barriers to Discharge      Equipment Recommendations  3 in 1 bedside comode    Recommendations for Other Services    Frequency  Min 2X/week    Precautions / Restrictions Precautions Precautions: Fall   Pertinent Vitals/Pain See vitals    ADL  Grooming: Performed;Wash/dry face;Set up Where Assessed - Grooming: Unsupported sitting Upper Body Bathing: Simulated;Set up Where Assessed - Upper Body Bathing: Unsupported sitting Lower Body Bathing: Simulated;Minimal assistance Where Assessed - Lower Body Bathing: Unsupported sitting Upper Body Dressing: Simulated;Set up Where Assessed - Upper Body Dressing: Unsupported sitting Lower Body Dressing: Simulated;Moderate assistance Where Assessed - Lower Body Dressing: Supported sit to stand Toilet Transfer: Mining engineer Method: Sit to Barista:  (bed) Equipment Used: Gait belt;Rolling walker Transfers/Ambulation Related to ADLs: min assist with RW ADL Comments: Pt limited by dizziness, "feeling off balance" during session.  Willing to work with therapist OOB but quickly requesting to return to bed as dizziness felt worse during ambulation.  Cued pt to focus on stationary objects during ambulation which seemed to help a little.    OT Diagnosis: Generalized weakness  OT Problem List: Decreased activity  tolerance;Impaired balance (sitting and/or standing);Decreased knowledge of use of DME or AE OT Treatment Interventions: Self-care/ADL training;DME and/or AE instruction;Therapeutic activities;Patient/family education;Balance training   OT Goals Acute Rehab OT Goals OT Goal Formulation: With patient Time For Goal Achievement: 06/15/12 Potential to Achieve Goals: Good ADL Goals Pt Will Transfer to Toilet: with supervision;Ambulation;with DME;Comfort height toilet ADL Goal: Toilet Transfer - Progress: Goal set today Pt Will Perform Toileting - Clothing Manipulation: with supervision ADL Goal: Toileting - Clothing Manipulation - Progress: Goal set today Pt Will Perform Toileting - Hygiene: with supervision;Sit to stand from 3-in-1/toilet ADL Goal: Toileting - Hygiene - Progress: Goal set today Miscellaneous OT Goals Miscellaneous OT Goal #1: Pt will independently use visual compensatory strategies to help minimize dizziness during functional mobility. OT Goal: Miscellaneous Goal #1 - Progress: Goal set today Miscellaneous OT Goal #2: Pt will perform bed mobility at mod I level as precursor for EOB ADLs. OT Goal: Miscellaneous Goal #2 - Progress: Goal set today  Visit Information  Last OT Received On: 06/01/12 Assistance Needed: +1    Subjective Data      Prior Functioning     Home Living Lives With: Son Available Help at Discharge: Family;Available PRN/intermittently Type of Home: House Home Access: Stairs to enter [employer] of Steps: 3 Entrance Stairs-Rails: None Home Layout: One level Bathroom Shower/Tub: Forensic scientist: Standard Home Adaptive Equipment: None Prior Function Level of Independence: Independent Able to Take Stairs?: Yes Driving: No Communication Communication: No difficulties Dominant Hand: Right         Vision/Perception Vision - Assessment Additional Comments: Will continue to assess. Difficult to assess due  to dizziness.   Cognition  Cognition Arousal/Alertness: Awake/alert Behavior During Therapy: WFL for tasks assessed/performed Overall Cognitive Status: Within Functional Limits for tasks assessed  Extremity/Trunk Assessment Right Upper Extremity Assessment RUE ROM/Strength/Tone: [facility] for tasks assessed Left Upper Extremity Assessment LUE ROM/Strength/Tone: WFL for tasks assessed     Mobility Bed Mobility Bed Mobility: Rolling Right;Right Sidelying to Sit;Sitting - Scoot to [employer] of Bed;Sit to Sidelying Right Rolling Right: 4: Min assist Right Sidelying to Sit: 4: Min assist;HOB elevated Sitting - Scoot to [employer] of Bed: 4: Min assist Sit to Sidelying Left: 4: Min assist;HOB elevated Details for Bed Mobility Assistance: vc's for sequencing Transfers Transfers: Sit to Stand;Stand to Sit Sit to Stand: 4: Min assist;From bed Stand to Sit: 4: Min assist;To bed Details for Transfer Assistance: Assist for steadying due to pt feeling like she is losing her balance "falling over".       Exercise     Balance     End of Session OT - End of Session Equipment Utilized During Treatment: Gait belt (RW) Activity Tolerance: Other (comment) (limited by dizziness) Patient left: in bed;with call bell/phone within reach  GO    06/01/2012 Cipriano Mile OTR/L Pager [phone] Office [phone]  Cipriano Mile 06/01/2012, 4:41 PM

## 2012-06-02 LAB — GLUCOSE, CAPILLARY
Glucose-Capillary: 160 mg/dL — ABNORMAL HIGH (ref 70–99)
Glucose-Capillary: 108 mg/dL — ABNORMAL HIGH (ref 70–99)
Glucose-Capillary: 144 mg/dL — ABNORMAL HIGH (ref 70–99)

## 2012-06-02 LAB — BASIC METABOLIC PANEL
Chloride: 103 mEq/L (ref 96–112)
GFR calc Af Amer: 89 mL/min — ABNORMAL LOW (ref >90)
Potassium: 3.6 mEq/L (ref 3.5–5.1)

## 2012-06-02 MED ORDER — GLIMEPIRIDE 2 MG PO TABS
2.0000 mg | ORAL_TABLET | Freq: Every day | ORAL | Status: DC
  Administered 2012-06-03: 2 mg via ORAL
  Filled 2012-06-02 (×2): qty 1

## 2012-06-02 NOTE — Progress Notes (Signed)
Subjective: Looks great- smiles, no c/o, good pos  Objective: Vital signs in last 24 hours: Temp:  [97.7 F (36.5 C)-98.1 F (36.7 C)] 97.9 F (36.6 C) (04/20 0600) Pulse Rate:  [52-58] 52 (04/20 0600) Resp:  [16-18] 18 (04/20 0600) BP: (129-167)/(52-59) 167/52 mmHg (04/20 0600) SpO2:  [97 %-99 %] 97 % (04/20 0600) Weight:  [31.8 kg (70 lb 1.7 oz)] 31.8 kg (70 lb 1.7 oz) (04/20 0500) Weight change: 0.048 kg (1.7 oz)  CBG (last 3)   Recent Labs  06/01/12 1621 06/01/12 2120 06/02/12 0622  GLUCAP 390* 209* 202*    Intake/Output from previous day: 04/19 0701 - 04/20 0700 In: 360 [P.O.:360] Out: 875 [Urine:875]  Physical Exam:  Patient is awake and alert bitemporal wasting good facial symmetry. No JVD or bruits. Lungs are clear. Cardiovascular distant heart sounds regular no murmur. Abdomen soft and nontender. No peripheral edema intact pulses. Neurologically conversant appropriate nonlateralizing    Lab Results:  Recent Labs  05/31/12 0430 06/02/12 0830  NA 141 140  K 2.8* 3.6  CL 102 103  CO2 30 32  GLUCOSE 103* 177*  BUN 19 11  CREATININE 0.89 0.76  CALCIUM 8.7 8.4   No results found for this basename: AST, ALT, ALKPHOS, BILITOT, PROT, ALBUMIN,  in the last 72 hours  Recent Labs  05/31/12 0430  WBC 6.6  HGB 9.7*  HCT 27.7*  MCV 86.6  PLT 237   Lab Results  Component Value Date   INR 1.04 05/28/2012   INR 0.98 05/28/2012   INR 0.97 01/27/2012   No results found for this basename: CKTOTAL, CKMB, CKMBINDEX, TROPONINI,  in the last 72 hours No results found for this basename: TSH, T4TOTAL, FREET3, T3FREE, THYROIDAB,  in the last 72 hours No results found for this basename: VITAMINB12, FOLATE, FERRITIN, TIBC, IRON, RETICCTPCT,  in the last 72 hours  Studies/Results: Ct Abdomen Pelvis W Contrast  05/31/2012  *RADIOLOGY REPORT*  Clinical Data: Occult malignancy.  CT ABDOMEN AND PELVIS WITH CONTRAST  Technique:  Multidetector CT imaging of the abdomen and  pelvis was performed following the standard protocol during bolus administration of intravenous contrast.  Contrast: 60mL OMNIPAQUE IOHEXOL 300 MG/ML  SOLN  Comparison: None.  Findings: Small bilateral pleural effusions with bibasilar atelectasis.  Heart is mildly enlarged.  Prior cholecystectomy.  Liver, pancreas, adrenals and kidneys are unremarkable.  Tiny low density lesion in the inferior tip of the spleen is nonspecific and too small to characterize, but most likely a small cyst or hemangioma.  Foley catheter is in place within the bladder which is filled with air, otherwise grossly unremarkable.  Scattered descending colonic and sigmoid diverticula.  No active diverticulitis.  Small bowel is decompressed.  Prior hysterectomy.  No adnexal masses.  No free fluid, free air or adenopathy.  Aorta and iliac vessels are calcified, non-aneurysmal.  No acute or focal bony abnormality.  Degenerative disc and facet disease throughout the lumbar spine.  IMPRESSION: Small bilateral pleural effusions with bibasilar atelectasis.  Descending colonic and sigmoid diverticulosis.   Original Report Authenticated By: Charlett Nose, M.D.      Assessment/Plan: 1 hypertensive crisis status post cardiac drip currently acceptable control  #2 hypertensive encephalopathy gradually clearing no evidence of a vascular insult  #3 diabetes mellitus type 2 currently diet controlled on sliding scale insulin  #4 protein/calorie malnutrition secondary to failure to thrive and anorexia with GI workup underway as an outpatient  #5 pneumonia empiric treatment for aspiration  #6 seizure-appear  treatment underway please see neurology note.    LOS: 5 days   Tekeshia Klahr A 06/02/2012, 10:29 AM

## 2012-06-03 ENCOUNTER — Encounter: Payer: Self-pay | Admitting: Cardiovascular Disease

## 2012-06-03 DIAGNOSIS — M79609 Pain in unspecified limb: Secondary | ICD-10-CM

## 2012-06-03 LAB — GLUCOSE, CAPILLARY
Glucose-Capillary: 109 mg/dL — ABNORMAL HIGH (ref 70–99)
Glucose-Capillary: 124 mg/dL — ABNORMAL HIGH (ref 70–99)

## 2012-06-03 MED ORDER — GLIMEPIRIDE 2 MG PO TABS: 2.0000 mg | ORAL_TABLET | Freq: Every day | ORAL | Status: DC

## 2012-06-03 MED ORDER — LEVETIRACETAM 750 MG PO TABS: 750.0000 mg | ORAL_TABLET | Freq: Two times a day (BID) | ORAL | Status: AC

## 2012-06-03 NOTE — Progress Notes (Signed)
Spoke to MD on call regarding plan for discharge.  Discussed preliminary results with MD and discussed patient steady ambulation.  MD states okay for patient to be discharged home.

## 2012-06-03 NOTE — Clinical Social Work Note (Signed)
Clinical Social Work   Per [employer], pt will be discharging home this evening as home care has been arranged. Pt's son is aware. CSW is signing off as no further needs identified.  Dede Query, MSW, LCSW [phone]

## 2012-06-03 NOTE — Clinical Social Work Psychosocial (Signed)
Clinical Social Work Department BRIEF PSYCHOSOCIAL ASSESSMENT 06/03/2012  Patient:  DELFINA, SCHREURS     Account Number:  [id number]     Admit date:  05/28/2012  Clinical Social Worker:  Peggyann Shoals  Date/Time:  06/03/2012 10:16 AM  Referred by:  Physician  Date Referred:  06/03/2012 Referred for  SNF Placement  Other - See comment   Other Referral:   Discharge planning needs   Interview type:  Family Other interview type:    PSYCHOSOCIAL DATA Living Status:  WITH ADULT CHILDREN Admitted from facility:   Level of care:   Primary support name:  Cheyenna Pankowski Primary support relationship to patient:  CHILD, ADULT Degree of support available:   very supportive.    CURRENT CONCERNS Current Concerns  Post-Acute Placement  Other - See comment   Other Concerns:   Discharge planning needs.    SOCIAL WORK ASSESSMENT / PLAN CSW received cosnult for discharge planning needs from physican. CSW met with pt's son to address consult. CSW introduced herself and explained role of social work. CSW also explained process of discharging to SNF, as well as discharging home with home health services. Currently, PT/OT is recommending SNF placement. Pt's son lives with pt and is a primary caregiver. Pt's son declined SNF placement, as he would like to take pt home. Pt's son's younger sister is also able to provide care when pt's son is unavailable. Pt shared that he has a friend who is able and willing to provide supervision and care while pt's son works. CSW encouraged pt's son to speak with his friend to make arrangements for her to provide care. CSW also explained process of admitting to SNF from home, in the event pt' care at home becomes unmanageable. CSW provided support to pt's son, as he his a primary caregiver and addressed his self care. Pt's son shared that he is able to reach out to his younger sister and friends. Pt's son stated that he also trys to remain active in the activities  that he enjoys. CSW will update RNCM on pt's discharge plan. CSW recommeds a home health Child psychotherapist. Pt's son is able to arrange for transportation at discharge. CSW will continue to follow during this admission for assistance in discharge planning needs.   Assessment/plan status:  Psychosocial Support/Ongoing Assessment of Needs Other assessment/ plan:   Information/referral to community resources:   SNF list  Home Health agency list [facility])    PATIENT'S/FAMILY'S RESPONSE TO PLAN OF CARE: Pt was sleeping soundly during assessment. Pt's son was very pleasant and agreeable to pt returning home with home health services, as this would be pt's wishes.   Dede Query, MSW, LCSW [phone]

## 2012-06-03 NOTE — Progress Notes (Signed)
Occupational Therapy   06/02/12 1500  OT Visit Information  Last OT Received On 06/02/12  Assistance Needed +1  OT Time Calculation  OT Start Time 1333  OT Stop Time 1414  OT Time Calculation (min) 41 min  Precautions  Precautions Fall  ADL  ADL Comments Pt sitting up in chair with son present.  Pt very guarded initially.  She reports the sensation that she is spinning all the time.  Pt instructed on a dizziness scale of 1-10.  She reports dizziness 3/10 at rest.  Occulomotor pursuits performed.  Pt. with no increase in dizziness with Rt. gaze (mild horizontal nystagmus noted).  However, dizziness 8/10 with Lt gazed (mild horizontal nystagmus noted) and 8/10 with superior and inferior gaze - pt unable to continue.  Pt instructed in occulomotor exercises using a written letter "A"  She performed x 8 reps with dizziness improving to only 3/10 with all movements.  A large "A" was then taped to BR door with pt instruction to stabilize gaze on the target while performing transitional movements - sit to stand, ambulating forward and back  x 5 ft. each.  Pt. able to maintain dizziness at 4/10 with all movements if she was able to maintain gaze fixation.  If pt lost fixation by moving away from line of site of the target dizziness increased to 8/10.  Pt very pleased with improvements.  Written instruction was provided and son was able to independently instruct pt to perfom.  Pt is eager to continue.   Cognition  Arousal/Alertness Awake/alert  Behavior During Therapy WFL for tasks assessed/performed  Overall Cognitive Status Within Functional Limits for tasks assessed  Bed Mobility  Bed Mobility Not assessed  Transfers  Transfers Sit to Stand;Stand to Sit  Sit to Stand 4: Min assist;From chair/3-in-1;With upper extremity assist  Stand to Sit 4: Min guard;With upper extremity assist;To chair/3-in-1  Details for Transfer Assistance assist to help pt transition.  Verbal cues for hand placement   Exercises  Exercises Other exercises  Other Exercises  Other Exercises Occulomotor pursuits using the letter "A"  Other Exercises Pt instructed in gaze fixation when moving  OT - End of Session  Activity Tolerance Patient tolerated treatment well  Patient left in chair;with call bell/phone within reach;with family/visitor present  OT Assessment/Plan  Comments on Treatment Session Pt provided with occulomotor exercises and instructed in gaze fixation when moving with significant improvement in dizziness.  Pt and son have been provided with HEP and will continue to see.  Once pt able to perform basic movement maintaining dizziness 4/10, will begin working toward more complex movements required for ADLs  OT Plan Discharge plan remains appropriate  OT Frequency Min 2X/week  Follow Up Recommendations SNF;Supervision/Assistance - 24 hour  OT Equipment 3 in 1 bedside comode  Acute Rehab OT Goals  OT Goal Formulation With patient/family  Time For Goal Achievement 06/03/12  Potential to Achieve Goals Good  ADL Goals  Pt Will Transfer to Toilet with supervision;Ambulation;with DME;Comfort height toilet  ADL Goal: Toilet Transfer - Progress Progressing toward goals  Pt Will Perform Toileting - Clothing Manipulation with supervision  Pt Will Perform Toileting - Hygiene with supervision;Sit to stand from 3-in-1/toilet  Pt Will Perform Lower Body Bathing with min assist;Sit to stand from bed;Sit to stand from chair  Pt Will Perform Upper Body Bathing with supervision;Sitting, chair  Pt Will Perform Grooming with min assist;Standing at sink  Additional ADL Goal #1 Pt and son will be independent with  HEP to improve dizziness  ADL Goal: Additional Goal #1 - Progress Goal set today  Additional ADL Goal #2 Pt will complete simple ADL with dizziness no greater than 4/10  ADL Goal: Additional Goal #2 - Progress Goal set today  ADL Goal: Grooming - Progress Goal set today  ADL Goal: Upper Body Bathing -  Progress Goal set today  ADL Goal: Lower Body Bathing - Progress Goal set today  Miscellaneous OT Goals  Miscellaneous OT Goal #1 Pt will independently use visual compensatory strategies to help minimize dizziness during functional mobility.  OT Goal: Miscellaneous Goal #1 - Progress Progressing toward goals  Miscellaneous OT Goal #2 Pt will perform bed mobility at mod I level as precursor for EOB ADLs.  OT General Charges  $OT Visit 1 Procedure  OT Treatments  $Therapeutic Activity 38-52 mins   [employer], OTR/L [phone]

## 2012-06-03 NOTE — Progress Notes (Signed)
Pt and patient's son provided with discharge instructions.  Pt alert and oriented x3.  Called regarding foley again and was informed by MD on call to leave foley in and have patient follow up with Dr. Jacky Kindle tomorrow.  Pt and patient's son informed about foley and provided with foley teaching and teach back.  IV access removed site clean, dry and intact no swelling, bruising, redness noted; cannula intact.  Pt tolerated procedure well.  Telemetry discontinued cleaned and returned.  Pt took home health paperwork home and walker home as well. Pt discharged home per wheelchair to private car with son.

## 2012-06-03 NOTE — Progress Notes (Addendum)
Occupational Therapy Treatment Patient Details Name: NIAMYA VITTITOW MRN: [id number] DOB: [birth year] Today's Date: 06/03/2012 Time: 0865-7846 OT Time Calculation (min): 26 min  OT Assessment / Plan / Recommendation Comments on Treatment Session  Pt with improved dizziness this date, and improving ability to perform BADLs.  Still very limited with activity, however.  Pt with complaint severe pain Rt. Knee when ambulating 8-9/10.  Pt returned to sitting and pain posterior Rt knee to palpation with complaint mild calf pain, + Homan's sign.  Agree with PT recommendation for use of medication to control dizziness as that is largest limiting factor to pt's being able to perform ADLs.    Follow Up Recommendations  Home health OT;Supervision/Assistance - 24 hour    Barriers to Discharge       Equipment Recommendations  3 in 1 bedside comode    Recommendations for Other Services    Frequency Min 2X/week   Plan Discharge plan needs to be updated    Precautions / Restrictions Precautions Precautions: Fall   Pertinent Vitals/Pain     ADL  Lower Body Dressing: Minimal assistance Where Assessed - Lower Body Dressing: Supported sit to stand Toilet Transfer: Minimal assistance Toilet Transfer Method: Sit to Barista: Bedside commode Equipment Used: Rolling walker Transfers/Ambulation Related to ADLs: min guard with RW ADL Comments: Pt very fatigued today.  She and son report she has been performing exercises provided. She reports dizziness at the very worst 4-5/10 today.  pt able to don/doff socks with dizzinesss 4/10and ambulated into bathroom with dizziness 5/10; however, pt complained of severe Rt. knee pain with WBing.  Pt returned to EOB.  Pt with paint to palpation posterior Rt. knee with mild pain Rt. calf + Homan's sign.  RN notified. Pt and son report pt will sponge bathe at home, and will therefore not need tub DME    OT Diagnosis:    OT Problem List:   OT  Treatment Interventions:     OT Goals Acute Rehab OT Goals OT Goal Formulation: With patient/family Time For Goal Achievement: 06/10/12 Potential to Achieve Goals: Good ADL Goals ADL Goal: Toilet Transfer - Progress: Progressing toward goals ADL Goal: Additional Goal #1 - Progress: Progressing toward goals ADL Goal: Additional Goal #2 - Progress: Progressing toward goals Miscellaneous OT Goals OT Goal: Miscellaneous Goal #1 - Progress: Progressing toward goals  Visit Information  Last OT Received On: 06/03/12 Assistance Needed: +1    Subjective Data      Prior Functioning       Cognition  Cognition Arousal/Alertness: Lethargic Behavior During Therapy: Flat affect Overall Cognitive Status: Impaired/Different from baseline Area of Impairment: Attention;Following commands Current Attention Level: Sustained Following Commands: Follows one step commands consistently Problem Solving: Slow processing General Comments: continues with slow reponeses.  Only able to perform one thing at a time.    Mobility  Bed Mobility Bed Mobility: Not assessed Supine to Sit: 5: Supervision;HOB elevated Sitting - Scoot to Edge of Bed: 5: Supervision Details for Bed Mobility Assistance: did not lower HOB to minimize inducing dizzy/nausea symptoms Transfers Transfers: Sit to Stand;Stand to Sit Sit to Stand: 4: Min guard;From chair/3-in-1 Stand to Sit: 4: Min guard;With upper extremity assist;To chair/3-in-1 Details for Transfer Assistance: vc's for hand placement    Exercises  Other Exercises Other Exercises: Reviewed gaze stabilization exercise given by OT; when pt asked to demonstrate, she turned head and eyes as she followed "A" left and right; re-educated on keeping her head still and  moving just her eyes. She was unable to move the "A" herself at an appropriate speed and not turn her head, therefore I moved the "A"  Pt up to a 5/10 dizziness after 3 reps each direction (although question if  pt understands scale, as later she stated she felt better and was a 7/10. Re-explained scale and she continued to state she felt better and was a 7/10).  Other Exercises: Pt independently utilizing "A" with mobility   Balance Static Sitting Balance Static Sitting - Balance Support: Feet unsupported;No upper extremity supported Static Sitting - Level of Assistance: 7: Independent   End of Session OT - End of Session Activity Tolerance: Patient limited by fatigue;Patient limited by pain Patient left: in bed;with call bell/phone within reach;with family/visitor present Nurse Communication: Other (comment) (Pain Rt. knee)  GO     Todrick Siedschlag M 06/03/2012, 4:48 PM

## 2012-06-03 NOTE — Progress Notes (Signed)
Physical Therapy Treatment Patient Details Name: Julia [name] MRN: [id number] DOB: Apr 01, 1927 Today's Date: 06/03/2012 Time: 4098-1191 PT Time Calculation (min): 30 min  PT Assessment / Plan / Recommendation Comments on Treatment Session  Today pt denied any spinning throughout entire treatment. Was a 0/10 on arrival and at worst was a 5/10 with walking and looking around and with exercises. Her ratings for dizziness/nausea are unreliable, however, as she does not consistently understand use of the scale. Again, wonder if pt would benefit from use of meclizine/Antivert to diminish symptoms. (She reports she has used "a medicine" in the past that was helpful).    Follow Up Recommendations  Supervision/Assistance - 24 hour; Home health PT (per SW note, son refusing SNF; states can provide 24/7 supervision)     Does the patient have the potential to tolerate intense rehabilitation     Barriers to Discharge        Equipment Recommendations  Rolling walker with 5" wheels (TBD)    Recommendations for Other Services    Frequency Min 3X/week   Plan Discharge plan needs to be updated;Frequency remains appropriate    Precautions / Restrictions Precautions Precautions: Fall   Pertinent Vitals/Pain See notes re: dizziness ratings     Mobility  Bed Mobility Bed Mobility: Supine to Sit;Sitting - Scoot to Edge of Bed Supine to Sit: 5: Supervision;HOB elevated Sitting - Scoot to [place] of Bed: 5: Supervision Details for Bed Mobility Assistance: did not lower HOB to minimize inducing dizzy/nausea symptoms Transfers Transfers: Sit to Stand;Stand to Sit Sit to Stand: 4: Min guard Stand to Sit: 4: Min guard Details for Transfer Assistance: vc for hand placement and safe use of RW; pt unable to follow verbal cues only and required demonstration and still with difficulty following safe techniques Ambulation/Gait Ambulation/Gait Assistance: 4: Min guard;4: Min Environmental consultant  (Feet): 70 Feet Assistive device: Rolling walker Ambulation/Gait Assistance Details: attached an "A" to the front of her walker and instructed pt to keep her eyes on the letter to stabilize her gaze; initially pt able to do this with minguard assist, however as dizziness increased, required assist to keep a straight path (pushing her RW to the left)(noted pt lost focus on "A" on return and looking around--likely accounting for worse balance) Gait Pattern: Step-through pattern;Decreased stride length;Shuffle;Trunk flexed;Narrow base of support Gait velocity: significantly decr    Exercises Other Exercises Other Exercises: Reviewed gaze stabilization exercise given by OT; when pt asked to demonstrate, she turned head and eyes as she followed "A" left and right; re-educated on keeping her head still and moving just her eyes. She was unable to move the "A" herself at an appropriate speed and not turn her head, therefore I moved the "A"  Pt up to a 5/10 dizziness after 3 reps each direction (although question if pt understands scale, as later she stated she felt better and was a 7/10. Re-explained scale and she continued to state she felt better and was a 7/10).    PT Diagnosis:    PT Problem List:   PT Treatment Interventions:     PT Goals Additional Goals Additional Goal #1: reassess pt for L or R BPPV and treat as necessary to reach <3/10 intensity vertigo PT Goal: Additional Goal #1 - Progress: Discontinued (comment) Additional Goal #2: Side to sit with min guard and ,3/10 s/s of vertigo PT Goal: Additional Goal #2 - Progress: Progressing toward goal Additional Goal #3: amb >50 feet with least restrictive device min  guard and s/s of vertigo <3/10 PT Goal: Additional Goal #3 - Progress: Progressing toward goal  Visit Information  Last PT Received On: 06/03/12 Assistance Needed: +1    Subjective Data  Subjective: Reports no dizziness on arrival (pt upright in bed); denied vertigo/spinning  throughout entire session; states she feels she is falling to her Left Patient Stated Goal: Be able to get back home.   Cognition  Cognition Arousal/Alertness: Awake/alert Behavior During Therapy: WFL for tasks assessed/performed Overall Cognitive Status: Impaired/Different from baseline Area of Impairment: Attention;Following commands;Problem solving Current Attention Level: Sustained Following Commands: Follows one step commands inconsistently Problem Solving: Slow processing General Comments: very slow responses (verbally and activity); incr time and repetition for simple commands (and even then unable to perform at times)    Balance  Static Sitting Balance Static Sitting - Balance Support: Feet unsupported;No upper extremity supported Static Sitting - Level of Assistance: 7: Independent  End of Session PT - End of Session Equipment Utilized During Treatment: Gait belt Activity Tolerance: Treatment limited secondary to medical complications (Comment) (dizziness and nausea (less than previously)) Patient left: in chair;with call bell/phone within reach Nurse Communication: Mobility status;Other (comment) (pt up in chair; use of A on RW)   GP     Tanaysia Bhardwaj 06/03/2012, 2:02 PM  06/03/2012 Veda Canning, PT Pager: [phone]

## 2012-06-03 NOTE — Progress Notes (Signed)
*  PRELIMINARY RESULTS* Vascular Ultrasound Right lower extremity venous duplex has been completed.  Preliminary findings: Right = no evidence of DVT. Baker's cyst noted.   Farrel Demark, RDMS, RVT   06/03/2012, 4:01 PM

## 2012-06-03 NOTE — Discharge Summary (Signed)
DISCHARGE SUMMARY  Julia [name]  MR#: [id number]  DOB:[birth year]  Date of Admission: 05/28/2012 Date of Discharge: 06/03/2012  Attending Physician:Allyssa Abruzzese [name]  Patient's BMW:UXLKGMW,NUUVOZD A, MD  Consults:Treatment Team:  Minda Meo, MD  Discharge Diagnoses: Active Problems:   Altered mental status   Acute respiratory failure   Hypertensive emergency   Acute encephalopathy   Diabetes   Pneumonia, organism unspecified   Discharge Medications:   Medication List    STOP taking these medications       metFORMIN 500 MG tablet  Commonly known as:  GLUCOPHAGE     ranolazine 500 MG 12 hr tablet  Commonly known as:  RANEXA      TAKE these medications       aspirin 81 MG chewable tablet  Chew 81 mg by mouth daily.     atorvastatin 20 MG tablet  Commonly known as:  LIPITOR  Take 20 mg by mouth daily.     glimepiride 2 MG tablet  Commonly known as:  AMARYL  Take 1 tablet (2 mg total) by mouth daily with breakfast.     isosorbide mononitrate 60 MG 24 hr tablet  Commonly known as:  IMDUR  Take 60 mg by mouth daily.     levETIRAcetam 750 MG tablet  Commonly known as:  KEPPRA  Take 1 tablet (750 mg total) by mouth 2 (two) times daily.     metoprolol 50 MG tablet  Commonly known as:  LOPRESSOR  Take 50 mg by mouth 2 (two) times daily.     nitroGLYCERIN 0.4 MG SL tablet  Commonly known as:  NITROSTAT  Place 0.4 mg under the tongue every 5 (five) minutes as needed. For chest pain     omeprazole 20 MG capsule  Commonly known as:  PRILOSEC  Take 20 mg by mouth daily.     torsemide 20 MG tablet  Commonly known as:  DEMADEX  Take 20 mg by mouth daily.        Hospital Procedures: Ct Angio Head W/cm &/or Wo Cm  05/28/2012  *RADIOLOGY REPORT*  Clinical Data:  Unresponsive.  CT ANGIOGRAPHY HEAD  Technique:  Multidetector CT imaging of the head was performed using the standard protocol during bolus administration of intravenous contrast.   Multiplanar CT image reconstructions including MIPs were obtained to evaluate the vascular anatomy.  Contrast: 50mL OMNIPAQUE IOHEXOL 350 MG/ML SOLN  Comparison:  CT head without contrast earlier in the day.  Findings:  The internal carotid arteries are patent without significant stenosis.  There is mild nonstenotic atherosclerotic change in the supraclinoid segments.  The basilar artery is widely patent with vertebrals codominant.  There is no proximal stenosis or occlusion of the anterior, or middle cerebral arteries.  Mild nonstenotic irregularity proximal posterior cerebral arteries bilaterally.  There is no visible asymmetry of flow to the cerebral hemispheres.  There is no visible cerebellar branch occlusion.  No intracranial aneurysm is observed.  On venous phase images, the major dural venous sinuses appear patent.  There is no abnormal intracranial enhancement.  Small incidental venous angioma may be present in the left frontal lobe inferiorly.   Review of the MIP images confirms the above findings.  IMPRESSION: Unremarkable CTA intracranial circulation.  There is no dramatic abnormality, such as carotid or basilar occlusion, which might explain the patient's altered level of consciousness.   Original Report Authenticated By: Davonna Belling, M.D.    Ct Head Wo Contrast  05/28/2012  *RADIOLOGY REPORT*  Clinical Data: 79 year old  female Code stroke.  Found down at 1330 hours.  CT HEAD WITHOUT CONTRAST  Technique:  Contiguous axial images were obtained from the base of the skull through the vertex without contrast.  Comparison: 01/27/2012.  Findings: Visualized paranasal sinuses and mastoids are clear.  No acute osseous abnormality identified.  Visualized orbit soft tissues are within normal limits.  No definite scalp hematoma identified.  Calcified atherosclerosis at the skull base.  Degenerative changes in the upper cervical spine.  No midline shift, mass effect, or evidence of mass lesion.  No  ventriculomegaly. No acute intracranial hemorrhage identified. Stable patchy periventricular white matter hypodensity. No evidence of cortically based acute infarction identified.  No suspicious intracranial vascular hyperdensity.  IMPRESSION: No acute intracranial abnormality.  Stable since 01/27/2012 with mild for age white matter changes.  Critical Value/emergent results were called by telephone at the time of interpretation on 01/27/2012  at 1458 hours to Dr. Thana Farr, who verbally acknowledged these results.   Original Report Authenticated By: Erskine Speed, M.D.    Ct Cervical Spine Wo Contrast  05/28/2012  *RADIOLOGY REPORT*  Clinical Data: Fall, unresponsive.  CT CERVICAL SPINE WITHOUT CONTRAST  Technique:  Multidetector CT imaging of the cervical spine was performed. Multiplanar CT image reconstructions were also generated.  Comparison: CT head earlier in the day.  Findings: Marked pannus surrounds the odontoid.  There is dorsal cystic formation within the body of C2 which is chronic.  There is no odontoid fracture.  The pannus measures up to 8 mm thick . There is mild stenosis at the foramen magnum.  Mild facet mediated slip is noted at C3-4, C4-5, and C5-6.  The bones are osteopenic but there is no cervical spine fracture. There is no traumatic subluxation or soft tissue swelling.  No intraspinal hematoma is present.  Calcified protrusion noted at C3- 4 with left-sided facet arthropathy and foraminal narrowing.  Left- sided facet arthropathy also noted at C4-C5.  Right greater than left facet arthropathy at C5-C6.  No pneumothorax or lung apex lesions.Advanced atherosclerotic change at both carotid bifurcations.  Bilateral subcentimeter thyroid cysts.  IMPRESSION: Marked chronic pannus.  Advanced multilevel spondylosis.  No compression fracture or traumatic subluxation.  No intraspinal hematoma or neck mass.   Original Report Authenticated By: Davonna Belling, M.D.    Mr Laqueta Jean ZO  Contrast  05/30/2012  *RADIOLOGY REPORT*  Clinical Data: Fall.  Altered mental status  MRI HEAD WITHOUT AND WITH CONTRAST  Technique:  Multiplanar, multiecho pulse sequences of the brain and surrounding structures were obtained according to standard protocol without and with intravenous contrast  Contrast: 6mL MULTIHANCE GADOBENATE DIMEGLUMINE 529 MG/ML IV SOLN  Comparison: MRI 05/06/2010.  CT head 05/28/2012  Findings: Generalized atrophy, typical for age.  Mild Chiari malformation.  Cerebellar tonsils are 7 mm below the foramen magnum.  This is unchanged from prior studies.  Marked hypertrophy of the transverse ligament of C1 as noted in the cervical spine MRI report from today.  Chronic microvascular ischemic changes are present in the white matter bilaterally.  Mild chronic ischemia in the basal ganglia and pons.  No acute infarct.  Negative for intracranial hemorrhage.  Negative for fluid collection or midline shift. Mucosal thickening in the paranasal sinuses, mild.  Postcontrast imaging reveals normal enhancement.  No mass lesion is identified.  IMPRESSION: No acute intracranial abnormality.  Mild Chiari malformation.  Mild chronic microvascular ischemia.   Original Report Authenticated By: Janeece Riggers, M.D.    Mr Cervical Spine Wo  Contrast  05/30/2012  *RADIOLOGY REPORT*  Clinical Data: Fall.  MRI CERVICAL SPINE WITHOUT CONTRAST  Technique:  Multiplanar and multiecho pulse sequences of the cervical spine, to include the craniocervical junction and cervicothoracic junction, were obtained according to standard protocol without intravenous contrast.  Comparison:  CT cervical 05/28/2012  Findings: The patient was not able to tolerate postcontrast imaging.  There is mild motion on the study.  Cerebellar tonsils are low-lying approximately 8 mm below the foramen magnum.  At C1-2, there is extensive pannus formation and degenerative change.  There is thickening of the transverse ligament which is touching the  craniocervical junction.  No compression of the cord.  Negative for fracture or mass lesion.  No bone marrow or soft tissue edema is identified.  Mild disc and facet degeneration is present throughout the cervical spine.  No acute disc protrusion.  No intraspinal hematoma. Negative for cord compression.  No focal cord edema.  IMPRESSION: Mild Chiari malformation  Extensive pannus formation involving C1-C2.  The pannus is touching the spinal cord but not causing significant stenosis.  Negative for fracture.  No acute disc protrusion or hematoma.   Original Report Authenticated By: Janeece Riggers, M.D.    Ct Abdomen Pelvis W Contrast  05/31/2012  *RADIOLOGY REPORT*  Clinical Data: Occult malignancy.  CT ABDOMEN AND PELVIS WITH CONTRAST  Technique:  Multidetector CT imaging of the abdomen and pelvis was performed following the standard protocol during bolus administration of intravenous contrast.  Contrast: 60mL OMNIPAQUE IOHEXOL 300 MG/ML  SOLN  Comparison: None.  Findings: Small bilateral pleural effusions with bibasilar atelectasis.  Heart is mildly enlarged.  Prior cholecystectomy.  Liver, pancreas, adrenals and kidneys are unremarkable.  Tiny low density lesion in the inferior tip of the spleen is nonspecific and too small to characterize, but most likely [name] small cyst or hemangioma.  Foley catheter is in place within the bladder which is filled with air, otherwise grossly unremarkable.  Scattered descending colonic and sigmoid diverticula.  No active diverticulitis.  Small bowel is decompressed.  Prior hysterectomy.  No adnexal masses.  No free fluid, free air or adenopathy.  Aorta and iliac vessels are calcified, non-aneurysmal.  No acute or focal bony abnormality.  Degenerative disc and facet disease throughout the lumbar spine.  IMPRESSION: Small bilateral pleural effusions with bibasilar atelectasis.  Descending colonic and sigmoid diverticulosis.   Original Report Authenticated By: Charlett Nose, M.D.    Dg  Chest Portable 1 View  05/28/2012  *RADIOLOGY REPORT*  Clinical Data: Confusion  PORTABLE CHEST - 1 VIEW  Comparison: 01/28/2012  Findings: Cardiomediastinal silhouette is stable.  Mild hyperinflation.  No acute infiltrate or pulmonary edema.  Stable chronic mild interstitial prominence. Atherosclerotic calcifications of thoracic aorta again noted.  IMPRESSION: No active disease.  Mild hyperinflation.  Stable chronic mild interstitial prominence.   Original Report Authenticated By: Natasha Mead, M.D.     History of Present Illness:  79 yo female who was at home on the day of admission. Spoke to her son by phone with conversation ending at 59 and seemed her usual self. At 1330, son went to her home and found her down disheveled. He called EMS and they encoded to the ED 1416 with the understanding that she was last seen well at 0800. ED RN notified stroke team, and they arrived in ED at 1422. Head CT was ordered; however she was mute and extremely agitated and she was give Ativan 1 mg IV which had little effect and was  repeated. CT was unremarkable. Pt was unrepsonsive after the second dose of ativan. It was decided to do [name] CTA and CT of cervical spine. She tolerated those procedures well. Pt remained unresponsive. BP was 256/70. Patient was started on [name] cardene drip and PCCM was called to admit.  Hospital Course: Patient was admitted encephalopathic in critical condition placed on [name] Cardene drip for her hypertensive management by the critical care service.  There is also an element of questionable aspiration pneumonia she was treated.  With [name] short course of Unasyn and bronchodilators.  Finally, she did have [name] neurologic workup looking for [name] vascular event or seizure all negative but impaired treatment with Keppra was initiated by neurology.  She was transitioned orals and gradually moved to the floor.  She does remain [name] bit weakened D conditioned.  He is eating well and will be sent home with support from  her family.  They did decline short-term skilled nursing or rehabilitation.  Day of Discharge Exam BP 140/64  Pulse 63  Temp(Src) 98.3 F (36.8 C) (Oral)  Resp 18  Ht 4\' 6"  (1.372 m)  Wt 33 kg (72 lb 12 oz)  BMI 17.53 kg/m2  SpO2 98%  Physical Exam: General appearance: alert, cooperative and no distress Eyes: no scleral icterus Throat: oropharynx moist without erythema Resp: clear to auscultation bilaterally Cardio: regular rate and rhythm Extremities: no clubbing, cyanosis or edema Neurologically patient is awake alert nonlateralizing  Discharge Labs:  Recent Labs  06/02/12 0830  NA 140  K 3.6  CL 103  CO2 32  GLUCOSE 177*  BUN 11  CREATININE 0.76  CALCIUM 8.4   No results found for this basename: AST, ALT, ALKPHOS, BILITOT, PROT, ALBUMIN,  in the last 72 hours No results found for this basename: WBC, NEUTROABS, HGB, HCT, MCV, PLT,  in the last 72 hours No results found for this basename: CKTOTAL, CKMB, CKMBINDEX, TROPONINI,  in the last 72 hours No results found for this basename: TSH, T4TOTAL, FREET3, T3FREE, THYROIDAB,  in the last 72 hours No results found for this basename: VITAMINB12, FOLATE, FERRITIN, TIBC, IRON, RETICCTPCT,  in the last 72 hours  Discharge instructions:     Discharge Orders   Future Orders Complete By Expires     Diet - low sodium heart healthy  As directed     Increase activity slowly  As directed        Disposition: home  Follow-up Appts: Follow-up with Dr. Jacky Kindle at [facility] in 1 week.  Call for appointment.  Condition on Discharge: improved and stable condition  Tests Needing Follow-up: none  Signed: Jahni Paul [name] 06/03/2012, 1:04 PM

## 2012-06-04 LAB — CULTURE, BLOOD (ROUTINE X 2)
Culture: NO GROWTH
Culture: NO GROWTH

## 2012-06-14 ENCOUNTER — Encounter: Payer: Self-pay | Admitting: Gastroenterology

## 2012-06-27 ENCOUNTER — Encounter: Payer: Self-pay | Admitting: *Deleted

## 2012-07-04 ENCOUNTER — Other Ambulatory Visit (INDEPENDENT_AMBULATORY_CARE_PROVIDER_SITE_OTHER): Payer: Medicare Other

## 2012-07-04 ENCOUNTER — Encounter: Payer: Self-pay | Admitting: Gastroenterology

## 2012-07-04 ENCOUNTER — Ambulatory Visit (INDEPENDENT_AMBULATORY_CARE_PROVIDER_SITE_OTHER): Payer: Medicare Other | Admitting: Gastroenterology

## 2012-07-04 VITALS — BP 130/58 | HR 91 | Ht <= 58 in | Wt <= 1120 oz

## 2012-07-04 DIAGNOSIS — K219 Gastro-esophageal reflux disease without esophagitis: Secondary | ICD-10-CM

## 2012-07-04 DIAGNOSIS — R634 Abnormal weight loss: Secondary | ICD-10-CM

## 2012-07-04 DIAGNOSIS — R899 Unspecified abnormal finding in specimens from other organs, systems and tissues: Secondary | ICD-10-CM

## 2012-07-04 DIAGNOSIS — R6889 Other general symptoms and signs: Secondary | ICD-10-CM

## 2012-07-04 DIAGNOSIS — D649 Anemia, unspecified: Secondary | ICD-10-CM

## 2012-07-04 LAB — FERRITIN: Ferritin: 32.4 ng/mL (ref 10.0–291.0)

## 2012-07-04 LAB — IBC PANEL
Saturation Ratios: 21.3 % (ref 20.0–50.0)
Transferrin: 208.3 mg/dL — ABNORMAL LOW (ref 212.0–360.0)

## 2012-07-04 LAB — VITAMIN B12: Vitamin B-12: 520 pg/mL (ref 211–911)

## 2012-07-04 NOTE — Progress Notes (Signed)
History of Present Illness:  This is a 79 year old [name] referred for evaluation of weight loss.  She recently was hospitalized with hypertensive encephalopathy, respiratory failure, and exacerbation of her diabetes.  I have seen her for over 20 years because of acid reflux with previous dilation of esophageal strictures.  She in the past is been on Prilosec 20 mg a day, but apparently is not taking that medication currently.  She also has had previous sigmoid resection because of diverticulitis, but has no history of colon polyps with last colonoscopy in 2006.  Apparently because of her recent illness and chronic medical problems, a weighted decreased to 60 pounds, but her son is with her today relates that she is now up to 70 pounds and her appetite seems good with mild perhaps early satiety but no nausea and vomiting.  The patient relates she has regular bowel movements without melena or hematochezia, abdominal pain, any systemic or hepatobiliary symptoms.  She does take aspirin 81 mg a day, and other medications are listed and reviewed.  Her past workup has included a normal Shilling's test, but she has had past severe B12 deficiency with parenteral replacement.  Recent hemoglobin on chart review was 9.6.  Her indices do not suggest iron deficiency.  She denies abuse of cigarettes, alcohol, or other NSAIDs.  I have reviewed this patient's present history, medical and surgical past history, allergies and medications.     ROS:   All systems were reviewed and are negative unless otherwise stated in the HPI... she denies shortness of breath, coughing, hemoptysis, or current cardiovascular issues.  Patient also denies a specific food intolerances, and  is taking minimal by mouth supplements.  No Known Allergies Outpatient Prescriptions Prior to Visit  Medication Sig Dispense Refill  . aspirin 81 MG chewable tablet Chew 81 mg by mouth daily.      Marland Kitchen atorvastatin (LIPITOR) 20 MG tablet Take 20 mg  by mouth daily.      Marland Kitchen glimepiride (AMARYL) 2 MG tablet Take 1 tablet (2 mg total) by mouth daily with breakfast.  30 tablet  11  . isosorbide mononitrate (IMDUR) 60 MG 24 hr tablet Take 60 mg by mouth daily.      Marland Kitchen levETIRAcetam (KEPPRA) 750 MG tablet Take 1 tablet (750 mg total) by mouth 2 (two) times daily.  60 tablet  11  . metoprolol (LOPRESSOR) 50 MG tablet Take 50 mg by mouth 2 (two) times daily.      . nitroGLYCERIN (NITROSTAT) 0.4 MG SL tablet Place 0.4 mg under the tongue every 5 (five) minutes as needed. For chest pain      . omeprazole (PRILOSEC) 20 MG capsule Take 20 mg by mouth daily.      Marland Kitchen torsemide (DEMADEX) 20 MG tablet Take 20 mg by mouth daily.       No facility-administered medications prior to visit.   Past Medical History  Diagnosis Date  . Hypertension   . GERD (gastroesophageal reflux disease)   . Edema leg 12/06/2011  . Hyperlipidemia 12/06/2011  . CAD (coronary artery disease), severe single vessel LAD disease, nl. EF 12/08/2011    Unsuccessful PCI  . Shortness of breath     "all the time since last Friday" (12/12/2011)  . Type II diabetes mellitus   . CKD (chronic kidney disease) stage 2, GFR 60-89 ml/min 12/06/2011  . Stroke     "several small strokes"; denies residual  (12/12/2011)  . Arthritis     "all my joints" (  12/12/2011)  . Anorexia   . Failure to thrive   . Memory loss    Past Surgical History  Procedure Laterality Date  . Pseudoaneurysm repair  12/12/2011    "no surgery; did ultrasonic; right femoral compression"  . Appendectomy    . Cholecystectomy    . Colectomy      "took out 27 1/2 inches" (12/12/2011)  . Lumbar disc surgery      "2 discs removed, spacer put in" (12/12/2011)  . Carpal tunnel release      bilaterally  . Hammer toe surgery      right  . Abdominal hysterectomy    . Back surgery    . Coronary angioplasty with stent placement  12/08/2011    "failed"  . Cardiac catheterization  12/07/2011   History   Social  History  . Marital Status: Widowed    Spouse Name: N/A    Number of Children: 3  . Years of Education: N/A   Occupational History  . Retired    Social History Main Topics  . Smoking status: Never Smoker   . Smokeless tobacco: Never Used  . Alcohol Use: No  . Drug Use: No  . Sexually Active: No   Other Topics Concern  . None   Social History Narrative  . None   Family History  Problem Relation Age of Onset  . Leukemia Brother   . Cancer Brother     ?  . Diabetes      Both Maternal and Paternal sides       Physical Exam: Blood pressure 130/58, pulse 90 and regular and weight 69 pounds with BMI of 16.68.  91% oxygen saturation on room air. General pale and very small-appearing patient in no acute distress without oxygen therapy.  Cannot appreciate stigmata of chronic liver disease, or lymphadenopathy. Eyes PERRLA, no icterus, fundoscopic exam per opthamologist Skin no lesions noted Neck supple, no adenopathy, no thyroid enlargement, no tenderness Chest clear to percussion and auscultation Heart no significant murmurs, gallops or rubs noted Abdomen no hepatosplenomegaly masses or tenderness, BS normal.  Extremities no acute joint lesions, edema, phlebitis or evidence of cellulitis. Neurologic patient oriented x 3, cranial nerves intact, no focal neurologic deficits noted. Psychological mental status normal and normal affect.  Assessment and plan: [name] has had chronic acid reflux for many years She seemed to have done well in the past on daily Prilosec 20 mg which I have restarted.  I will check iron and B12 levels.  I do not think we need to do endoscopy or colonoscopy since she is improving daily.  She also is high risk for any type of conscious sedation with her fragile medical condition and multiple cardiovascular/pulmonary issues.  I placed her on modified frequent small feedings low residue diet, and to continue followup Dr. Nicholaus Bloom in cardiology and Dr. Jacky Kindle  her primary care.  No diagnosis found.

## 2012-07-04 NOTE — Patient Instructions (Addendum)
Your physician has requested that you go to the basement for the following lab work before leaving today: Anemia Profile  Please take Omeprazole 20 mg over the counter once daily.  Please follow up in one year or sooner if any problems occur. _______________________________________________

## 2012-07-10 ENCOUNTER — Encounter: Payer: Self-pay | Admitting: Endocrinology

## 2012-07-10 ENCOUNTER — Encounter: Payer: Self-pay | Admitting: Gastroenterology

## 2012-07-10 ENCOUNTER — Telehealth: Payer: Self-pay | Admitting: Cardiovascular Disease

## 2012-07-10 NOTE — Telephone Encounter (Signed)
Increase torsemide to 40 mg for two days(take an extra 20mg  today).  Does she had compression stockings?  When is her next appt?

## 2012-07-10 NOTE — Telephone Encounter (Signed)
She says she have swelling from her knee all the way down in her ankles and feet-She said Dr Tresa Endo told her to call ifthis happen!

## 2012-07-10 NOTE — Telephone Encounter (Signed)
Returned call.  Pt stated the last time she was in the office Dr. Tresa Endo told her if the swelling started back in her legs to let him know.  Stated she has had swelling from her knees down for the past week in both legs.  Denied CP.  C/o SOB and stated it is not worse than it usually is.  Denied pain.  Stated just the swelling is worse.  Also c/o numbness in BLEs and stated it is not new.  Stated it has not changed.  Pt informed Dr. Tresa Endo is not in the office today and B. Leron Croak, PA-C will be notified for further instructions as she may need to be seen today and RN will call her back.  Pt verbalized understanding and agreed w/ plan.

## 2012-07-10 NOTE — Telephone Encounter (Signed)
Returned call and informed pt per instructions by PA. Pt does have compression stockings and advised to wear them as directed.  No f/u appt scheduled at this time.  Pt will have son call back if any concerns about instructions.  Pt verbalized understanding and agreed w/ plan.

## 2012-07-15 ENCOUNTER — Telehealth: Payer: Self-pay | Admitting: Cardiovascular Disease

## 2012-07-15 NOTE — Telephone Encounter (Signed)
Since Friday she seens to be more SOB,weaness in her voice,arms and legs,no energy-sieems to be declining-Please call asap!

## 2012-07-15 NOTE — Telephone Encounter (Signed)
Returned call. Left message to call back.

## 2012-07-15 NOTE — Telephone Encounter (Signed)
Mrs. Wandrey son returned your call.....  Thanks

## 2012-07-15 NOTE — Telephone Encounter (Signed)
Returned call.  Spoke w/ Molly Maduro, pt's son.  Stated pt is experiencing "a severe amount of shortness of breath," weakness in arms and legs.  Stated pt is unable to talk r/t SOB.  Advised he take pt to ER now for evaluation.  Son stated pt refuses to go to ER.  Stated he will talk to her and call back.  Dr. Tresa Endo notified and agreed w/ plan.  Advised pt increase torsemide to 40mg  daily and come in for evaluation since refuses ER.  Call to son and informed.  Appt scheduled for tomorrow at 8:40am w/ L. Annie Paras, NP.  Offered appt today and son declined.

## 2012-07-16 ENCOUNTER — Telehealth: Payer: Self-pay | Admitting: Cardiovascular Disease

## 2012-07-16 ENCOUNTER — Ambulatory Visit: Payer: Medicare Other | Admitting: Cardiology

## 2012-07-16 NOTE — Telephone Encounter (Signed)
Message forwarded to Dr. Tresa Endo (FI).

## 2012-07-16 NOTE — Telephone Encounter (Signed)
Returned call.  Pt stated she doesn't feel up to coming.  Stated she isn't any worse and the SOB is about the same.  Stated she has had the SOB for over a year and the swelling is down.  Pt stated she will have her son call back to reschedule an appt b/c after today he will be in charge of all of her health care.  Stated she is in her right mind and signed the papers that will get notarized today.

## 2012-07-16 NOTE — Telephone Encounter (Signed)
Pt called stating that the swelling in her legs has gone down. She had a lot of SOB when I spoke to her, but she wanted to cx her appointment bc she was not feeling up to coming in. She wanted to know when she needed to come in again now that the swelling in her legs has gone down.

## 2012-08-26 ENCOUNTER — Ambulatory Visit (INDEPENDENT_AMBULATORY_CARE_PROVIDER_SITE_OTHER): Payer: Medicare Other | Admitting: Cardiovascular Disease

## 2012-08-26 ENCOUNTER — Encounter: Payer: Self-pay | Admitting: Cardiovascular Disease

## 2012-08-26 VITALS — BP 140/74 | HR 55 | Ht <= 58 in | Wt 77.9 lb

## 2012-08-26 DIAGNOSIS — I10 Essential (primary) hypertension: Secondary | ICD-10-CM

## 2012-08-26 DIAGNOSIS — R0602 Shortness of breath: Secondary | ICD-10-CM

## 2012-08-26 DIAGNOSIS — R600 Localized edema: Secondary | ICD-10-CM

## 2012-08-26 DIAGNOSIS — R609 Edema, unspecified: Secondary | ICD-10-CM

## 2012-08-26 DIAGNOSIS — E785 Hyperlipidemia, unspecified: Secondary | ICD-10-CM

## 2012-08-26 DIAGNOSIS — I2510 Atherosclerotic heart disease of native coronary artery without angina pectoris: Secondary | ICD-10-CM

## 2012-08-26 MED ORDER — RANOLAZINE ER 500 MG PO TB12: 500.0000 mg | ORAL_TABLET | Freq: Two times a day (BID) | ORAL | Status: DC

## 2012-08-26 NOTE — Patient Instructions (Signed)
Your physician has recommended you make the following change in your medication: START new prescription for Ranexa 500 mg. This has already been sent  To your pharmacy.  Your physician recommends that you schedule a follow-up appointment in: 3 MONTHS.

## 2012-08-26 NOTE — Progress Notes (Signed)
Patient ID: Julia [name], female   DOB: 01-23-1928, 79 y.o.   MRN: [id number]     HPI: Julia [name], is a 79 y.o. female who presents for cardiology evaluation with a chief complaint of recent recurrent shortness of breath and chest heaviness.  Ms. Reader has established coronary artery disease with subtotal/total LAD occlusion and a very calcified LAD system. Initial attempts at performing intervention across the total occlusion were unsuccessful and constantly she's been on medical therapy. Has a history of hypertension. She also has multilevel cervical spondylosis. She has a history of metformin. In the past, she had tolerated low dose ranolazine into her medical regimen and this was titrated. She apparently developed a syncopal spell in the setting of dehydration and ultimately this had been discontinued. I saw her recently on 06/13/2012 he was bradycardic and I further reduced her Lopressor from 50 twice a day to 25 mg twice a day. She states her pulse is slow on the reduced dose of Lopressor. However, one week ago she did experience an episode of shortness of breath. This was associated with a pressure sensation in her chest and in her chest which resolved with 2 sublingual nitroglycerin.   Past Medical History  Diagnosis Date  . Hypertension   . GERD (gastroesophageal reflux disease)   . Edema leg 12/06/2011  . Hyperlipidemia 12/06/2011  . CAD (coronary artery disease), severe single vessel LAD disease, nl. EF 12/08/2011    Unsuccessful PCI  . Shortness of breath     "all the time since last Friday" (12/12/2011)  . Type II diabetes mellitus   . CKD (chronic kidney disease) stage 2, GFR 60-89 ml/min 12/06/2011  . Stroke     "several small strokes"; denies residual  (12/12/2011)  . Arthritis     "all my joints" (12/12/2011)  . Anorexia   . Failure to thrive   . Memory loss     Past Surgical History  Procedure Laterality Date  . Pseudoaneurysm repair  12/12/2011    "no  surgery; did ultrasonic; right femoral compression"  . Appendectomy    . Cholecystectomy    . Colectomy      "took out 27 1/2 inches" (12/12/2011)  . Lumbar disc surgery      "2 discs removed, spacer put in" (12/12/2011)  . Carpal tunnel release      bilaterally  . Hammer toe surgery      right  . Abdominal hysterectomy    . Back surgery    . Coronary angioplasty with stent placement  12/08/2011    "failed"  . Cardiac catheterization  12/07/2011    No Known Allergies  Current Outpatient Prescriptions  Medication Sig Dispense Refill  . aspirin 81 MG chewable tablet Chew 81 mg by mouth daily.      Marland Kitchen atorvastatin (LIPITOR) 20 MG tablet Take 20 mg by mouth daily.      Marland Kitchen glimepiride (AMARYL) 2 MG tablet Take 1 tablet (2 mg total) by mouth daily with breakfast.  30 tablet  11  . isosorbide mononitrate (IMDUR) 60 MG 24 hr tablet Take 60 mg by mouth daily.      Marland Kitchen levETIRAcetam (KEPPRA) 750 MG tablet Take 1 tablet (750 mg total) by mouth 2 (two) times daily.  60 tablet  11  . metoprolol (LOPRESSOR) 50 MG tablet Take 25 mg by mouth 2 (two) times daily.       . nitroGLYCERIN (NITROSTAT) 0.4 MG SL tablet Place 0.4 mg under the tongue every  5 (five) minutes as needed. For chest pain      . omeprazole (PRILOSEC) 20 MG capsule Take 20 mg by mouth daily.      Marland Kitchen torsemide (DEMADEX) 20 MG tablet Take 20 mg by mouth daily.      . ranolazine (RANEXA) 500 MG 12 hr tablet Take 1 tablet (500 mg total) by mouth 2 (two) times daily.  60 tablet  6   No current facility-administered medications for this visit.    Socially she is widowed. She has 3 children 5 grandchildren and 7 grandchildren. Her son lives with her during the day. He is with her today in the office.  ROS is negative for fevers, chills or night sweats. She denies any bleeding. She denies recent dizziness. She does have cervical spondylosis. She recently saw Dr. Jarold Motto was started on Prilosec. She does have GERD for many years. She does  have some mild leg swelling around her ankles to  Other system review is negative.  PE BP 140/74  Pulse 55  Ht 4\' 8"  (1.422 m)  Wt 77 lb 14.4 oz (35.335 kg)  BMI 17.47 kg/m2  General: Alert, oriented, no distress.  Skin: normal turgor, no rashes HEENT: Normocephalic, atraumatic. Pupils round and reactive; sclera anicteric;no lid lag. Extraocular muscles full. Nose without nasal septal hypertrophy Mouth/Parynx benign; Mallinpatti scale 2 Neck: No JVD, no carotid briuts Lungs: clear to ausculatation and percussion; no wheezing or rales Chest is kyphotic Heart: RRR, s1 s2 normal 1-2/6 systolic murmur. No S3 Abdomen: soft, nontender; no hepatosplenomehaly, BS+; abdominal aorta nontender and not dilated by palpation. Pulses 2+ Extremities: Trace pretibial and ankle edema bilaterally; no clubbing cyanosis, Homan's sign negative  Neurologic: grossly nonfocal  ECG: Sinus bradycardia 55 beats per minute without significant ST changes.  LABS:  BMET    Component Value Date/Time   NA 140 06/02/2012 0830   K 3.6 06/02/2012 0830   CL 103 06/02/2012 0830   CO2 32 06/02/2012 0830   GLUCOSE 177* 06/02/2012 0830   BUN 11 06/02/2012 0830   CREATININE 0.76 06/02/2012 0830   CALCIUM 8.4 06/02/2012 0830   GFRNONAA 76* 06/02/2012 0830   GFRAA 89* 06/02/2012 0830     Hepatic Function Panel     Component Value Date/Time   PROT 5.8* 05/28/2012 1828   ALBUMIN 2.8* 05/28/2012 1828   AST 30 05/28/2012 1828   ALT 14 05/28/2012 1828   ALKPHOS 73 05/28/2012 1828   BILITOT 1.5* 05/28/2012 1828     CBC    Component Value Date/Time   WBC 6.6 05/31/2012 0430   RBC 3.20* 05/31/2012 0430   HGB 9.7* 05/31/2012 0430   HCT 27.7* 05/31/2012 0430   PLT 237 05/31/2012 0430   MCV 86.6 05/31/2012 0430   MCH 30.3 05/31/2012 0430   MCHC 35.0 05/31/2012 0430   RDW 12.8 05/31/2012 0430   LYMPHSABS 1.6 05/28/2012 1427   MONOABS 0.4 05/28/2012 1427   EOSABS 0.1 05/28/2012 1427   BASOSABS 0.0 05/28/2012 1427     BNP      Component Value Date/Time   PROBNP 2859.0* 05/28/2012 1855    Lipid Panel     Component Value Date/Time   CHOL 207* 12/07/2011 0314   TRIG 276* 12/07/2011 0314   HDL 37* 12/07/2011 0314   CHOLHDL 5.6 12/07/2011 0314   VLDL 55* 12/07/2011 0314   LDLCALC 115* 12/07/2011 0314     RADIOLOGY: No results found.    ASSESSMENT AND PLAN: Ms. Muller has established coronary  artery disease with a subtotal/total mid LAD occlusion which she's been on medical therapy. On a reduced dose of Lopressor she's not as bradycardic as her last evaluation her pulse is now in the mid 50s increased from upper 40s. She is most likely having ischemia mediated shortness of breath which was nitrate responsive. I am electing to reinstitute a low dose trial of Ranexa at just 500 mg twice a day. I would not titrate this any further.This should not lower her blood pressure or pulse. She tells me she has an appointment to followup with Dr. Jacky Kindle within the next several weeks. I will see her in 3 months for followup cardiologic assessment.    [name], MD, [facility]  08/26/2012 3:04 PM

## 2012-09-03 ENCOUNTER — Other Ambulatory Visit (HOSPITAL_COMMUNITY): Payer: Self-pay | Admitting: Physician Assistant

## 2012-09-07 ENCOUNTER — Encounter: Payer: Self-pay | Admitting: Cardiovascular Disease

## 2012-10-07 ENCOUNTER — Telehealth: Payer: Self-pay | Admitting: Cardiovascular Disease

## 2012-10-07 NOTE — Telephone Encounter (Signed)
We do not have any samples at this time.  Gave patients son the # and website to contact to see if they can help w/Ranexa 500mg 

## 2012-10-07 NOTE — Telephone Encounter (Signed)
Need some samples of Ranexa please-just dont have the money to get it.

## 2012-10-08 NOTE — Telephone Encounter (Signed)
Please call-concerning her medicine-regarding the strength of it.

## 2012-10-08 NOTE — Telephone Encounter (Signed)
Returned call and spoke w/ Molly Maduro, pt's son.  Stated pt is out of Ranexa and he is unable to get her refill until Thursday b/c he just doesn't have the money.  No samples available and he did call Pt assistance program yesterday, but pt doesn't qualify until she is in the doughnut hole.  Stated he has some 1000 mg pt was taking before she went in the hospital in April of this year, but was told to d/c and start 500 mg BID.  Wants to know if he can give pt 1000 mg tab daily.  Advice: Dr. Tresa Endo will be notified.  DO NOT give pt 1000 mg tabs unless advised by Dr. Tresa Endo.   RN will call back when response given.  Son verbalized understanding and agreed w/ plan.  Message forwarded to Dr. Tresa Endo.

## 2012-10-10 NOTE — Telephone Encounter (Signed)
Dr. Tresa Endo stated "BID drug, not daily."  Returned call.  Left message to call back before 4pm.

## 2012-10-10 NOTE — Telephone Encounter (Signed)
Returned call.  Left message with instructions by Dr. Tresa Endo and advised to call back tomorrow before 4pm if questions or need to change med.

## 2012-10-10 NOTE — Telephone Encounter (Signed)
Patient returned your call.

## 2012-12-17 ENCOUNTER — Other Ambulatory Visit: Payer: Self-pay | Admitting: Cardiovascular Disease

## 2012-12-18 NOTE — Telephone Encounter (Signed)
Rx was sent to pharmacy electronically. 

## 2012-12-24 ENCOUNTER — Ambulatory Visit (INDEPENDENT_AMBULATORY_CARE_PROVIDER_SITE_OTHER): Payer: Medicare Other | Admitting: Cardiovascular Disease

## 2012-12-24 ENCOUNTER — Encounter: Payer: Self-pay | Admitting: Cardiovascular Disease

## 2012-12-24 VITALS — BP 160/80 | HR 67 | Ht <= 58 in | Wt 84.8 lb

## 2012-12-24 DIAGNOSIS — E785 Hyperlipidemia, unspecified: Secondary | ICD-10-CM

## 2012-12-24 DIAGNOSIS — E119 Type 2 diabetes mellitus without complications: Secondary | ICD-10-CM

## 2012-12-24 DIAGNOSIS — I2510 Atherosclerotic heart disease of native coronary artery without angina pectoris: Secondary | ICD-10-CM

## 2012-12-24 DIAGNOSIS — E782 Mixed hyperlipidemia: Secondary | ICD-10-CM

## 2012-12-24 DIAGNOSIS — R609 Edema, unspecified: Secondary | ICD-10-CM

## 2012-12-24 DIAGNOSIS — H269 Unspecified cataract: Secondary | ICD-10-CM

## 2012-12-24 DIAGNOSIS — I10 Essential (primary) hypertension: Secondary | ICD-10-CM

## 2012-12-24 DIAGNOSIS — R600 Localized edema: Secondary | ICD-10-CM

## 2012-12-24 MED ORDER — LISINOPRIL 2.5 MG PO TABS: 2.5000 mg | ORAL_TABLET | Freq: Every day | ORAL | Status: DC

## 2012-12-24 NOTE — Progress Notes (Signed)
Patient ID: Julia [name], female   DOB: 12-18-1927, 79 y.o.   MRN: [id number]     HPI: Julia [name] is a 79 y.o. female presents for cardiology evaluation. I last saw her approximately 4 months ago.  Julia [name] has established coronary disease with documented subtotal/total LAD occlusion in a very calcified LAD system. Initial attempts at performing intervention across the total occlusion were unsuccessful. She has been on medical therapy and has been fairly stable. Additional problems include hypertension, type 2 diabetes mellitus, multilevel cervical spondylosis,. She did develop bradycardia necessitating reduction of her beta blockade regimen. Recently, she has remained stable with the addition of Ranexa added to her medical regimen and is not having significant chest pain episodes. She tells me she may ultimately require cataract surgery.  She denies recent chest pain. She denies bleeding. She denies recent shortness of breath.  Past Medical History  Diagnosis Date  . Hypertension   . GERD (gastroesophageal reflux disease)   . Edema leg 12/06/2011  . Hyperlipidemia 12/06/2011  . CAD (coronary artery disease), severe single vessel LAD disease, nl. EF 12/08/2011    Unsuccessful PCI  . Shortness of breath     "all the time since last Friday" (12/12/2011)  . Type II diabetes mellitus   . CKD (chronic kidney disease) stage 2, GFR 60-89 ml/min 12/06/2011  . Stroke     "several small strokes"; denies residual  (12/12/2011)  . Arthritis     "all my joints" (12/12/2011)  . Anorexia   . Failure to thrive   . Memory loss     Past Surgical History  Procedure Laterality Date  . Pseudoaneurysm repair  12/12/2011    "no surgery; did ultrasonic; right femoral compression"  . Appendectomy    . Cholecystectomy    . Colectomy      "took out 27 1/2 inches" (12/12/2011)  . Lumbar disc surgery      "2 discs removed, spacer put in" (12/12/2011)  . Carpal tunnel release      bilaterally    . Hammer toe surgery      right  . Abdominal hysterectomy    . Back surgery    . Coronary angioplasty with stent placement  12/08/2011    "failed"  . Cardiac catheterization  12/07/2011    No Known Allergies  Current Outpatient Prescriptions  Medication Sig Dispense Refill  . albuterol (PROVENTIL) (2.5 MG/3ML) 0.083% nebulizer solution Take 2.5 mg by nebulization every 4 (four) hours as needed for wheezing or shortness of breath.      Marland Kitchen aspirin 81 MG chewable tablet Chew 81 mg by mouth daily.      Marland Kitchen atorvastatin (LIPITOR) 20 MG tablet Take 20 mg by mouth daily.      Marland Kitchen glimepiride (AMARYL) 2 MG tablet Take 4 mg by mouth daily with breakfast.      . isosorbide mononitrate (IMDUR) 60 MG 24 hr tablet Take 60 mg by mouth daily.      Marland Kitchen levETIRAcetam (KEPPRA) 750 MG tablet Take 1 tablet (750 mg total) by mouth 2 (two) times daily.  60 tablet  11  . Melatonin 3 MG TABS Take 0.5 tablets by mouth.      . metoprolol (LOPRESSOR) 50 MG tablet Take 25 mg by mouth 2 (two) times daily.       . nitroGLYCERIN (NITROSTAT) 0.4 MG SL tablet Place 0.4 mg under the tongue every 5 (five) minutes as needed. For chest pain      .  omeprazole (PRILOSEC) 20 MG capsule Take 20 mg by mouth daily.      . ranolazine (RANEXA) 500 MG 12 hr tablet Take 1 tablet (500 mg total) by mouth 2 (two) times daily.  60 tablet  6  . torsemide (DEMADEX) 20 MG tablet TAKE 1 TABLET BY MOUTH EVERY DAY  30 tablet  6   No current facility-administered medications for this visit.    History   Social History  . Marital Status: Widowed    Spouse Name: N/A    Number of Children: 3  . Years of Education: N/A   Occupational History  . Retired    Social History Main Topics  . Smoking status: Never Smoker   . Smokeless tobacco: Never Used  . Alcohol Use: No  . Drug Use: No  . Sexual Activity: No   Other Topics Concern  . Not on file   Social History Narrative  . No narrative on file    Family History  Problem Relation  Age of Onset  . Leukemia Brother   . Cancer Brother     ?  . Diabetes      Both Maternal and Paternal sides    ROS is negative for fevers, chills or night sweats. She does wear glasses. She has cataracts and is in need for future surgery. She denies PND or orthopnea. She denies cough or increased sputum production. She denies wheezing. There is no presyncope or syncope. She has class I -II NYHA  angina symptoms. She denies abdominal complaints. There is no nausea or vomiting. She denies indigestion. She denies bleeding. She denies urinary symptoms. There is no paresthesias. She denies ulcers. She does note some lotion edema left leg greater than right. She does take torsemide for this.   Other comprehensive 12 point system review is negative.  PE BP 160/80  Pulse 67  Ht 4\' 8"  (1.422 m)  Wt 84 lb 12.8 oz (38.465 kg)  BMI 19.02 kg/m2  Repeat blood pressure taken by me was 170/82 General: Alert, oriented, no distress.  Skin: normal turgor, no rashes HEENT: Normocephalic, atraumatic. Pupils round and reactive; sclera anicteric;no lid lag.  Nose without nasal septal hypertrophy Mouth/Parynx benign; Mallinpatti scale 2 Neck: No JVD, no carotid briuts Lungs: clear to ausculatation and percussion; no wheezing or rales Heart: RRR, s1 s2 normal 6 systolic murmur. No S3 gallop. Abdomen: soft, nontender; no hepatosplenomehaly, BS+; abdominal aorta nontender and not dilated by palpation. Pulses 2+ Extremities: Trace to 1+ left ankle edema, no significant right ankle edema no clubbing cyanosis. Homan's sign negative  Neurologic: grossly nonfocal Psychologic: normal affect and mood.  ECG: Sinus rhythm at 67 beats per minute. No ectopy. QTc interval 431 ms  LABS:  BMET    Component Value Date/Time   NA 140 06/02/2012 0830   K 3.6 06/02/2012 0830   CL 103 06/02/2012 0830   CO2 32 06/02/2012 0830   GLUCOSE 177* 06/02/2012 0830   BUN 11 06/02/2012 0830   CREATININE 0.76 06/02/2012 0830   CALCIUM  8.4 06/02/2012 0830   GFRNONAA 76* 06/02/2012 0830   GFRAA 89* 06/02/2012 0830     Hepatic Function Panel     Component Value Date/Time   PROT 5.8* 05/28/2012 1828   ALBUMIN 2.8* 05/28/2012 1828   AST 30 05/28/2012 1828   ALT 14 05/28/2012 1828   ALKPHOS 73 05/28/2012 1828   BILITOT 1.5* 05/28/2012 1828     CBC    Component Value Date/Time   WBC  6.6 05/31/2012 0430   RBC 3.20* 05/31/2012 0430   HGB 9.7* 05/31/2012 0430   HCT 27.7* 05/31/2012 0430   PLT 237 05/31/2012 0430   MCV 86.6 05/31/2012 0430   MCH 30.3 05/31/2012 0430   MCHC 35.0 05/31/2012 0430   RDW 12.8 05/31/2012 0430   LYMPHSABS 1.6 05/28/2012 1427   MONOABS 0.4 05/28/2012 1427   EOSABS 0.1 05/28/2012 1427   BASOSABS 0.0 05/28/2012 1427     BNP    Component Value Date/Time   PROBNP 2859.0* 05/28/2012 1855    Lipid Panel     Component Value Date/Time   CHOL 207* 12/07/2011 0314   TRIG 276* 12/07/2011 0314   HDL 37* 12/07/2011 0314   CHOLHDL 5.6 12/07/2011 0314   VLDL 55* 12/07/2011 0314   LDLCALC 115* 12/07/2011 0314     RADIOLOGY: No results found.    ASSESSMENT AND PLAN: My impression is that Ms. Touch is 79 year old female with known coronary artery disease with a subtotal/total very calcified LAD occlusion. She has been on medical therapy and has derived benefit with the addition of were no losing 500 mg twice a day added to her medical regimen. She is hypertensive today with systolic blood pressure in the 160-170 range. I elected to add low-dose lisinopril at just 2.5 mg daily. Followup laboratory work obtained in 2 weeks. She also does have some mild leg swelling for which he takes torsemide. She has not required significant nitroglycerin use. If she does need cataract surgery she will have clearance for this. I recommended she see Dr. Jacky Kindle in January for followup evaluation. I will see her in March 2015 for cardiology reevaluation.     [name], MD, [facility]  12/24/2012 2:37 PM

## 2012-12-24 NOTE — Patient Instructions (Addendum)
Your physician recommends that you return for lab work fasting in 2 weeks  Your physician recommends that you schedule a follow-up appointment in: march 2015.  Your physician has recommended you make the following change in your medication: start new prescription given for lisinopril. This has already been sent to the pharmacy.

## 2013-01-14 ENCOUNTER — Other Ambulatory Visit (HOSPITAL_COMMUNITY): Payer: Self-pay | Admitting: Cardiovascular Disease

## 2013-01-21 LAB — COMPREHENSIVE METABOLIC PANEL
ALT: 13 U/L (ref 0–35)
AST: 15 U/L (ref 0–37)
Alkaline Phosphatase: 49 U/L (ref 39–117)
BUN: 11 mg/dL (ref 6–23)
CO2: 33 mEq/L — ABNORMAL HIGH (ref 19–32)
Calcium: 9.3 mg/dL (ref 8.4–10.5)
Chloride: 101 mEq/L (ref 96–112)
Creat: 0.93 mg/dL (ref 0.50–1.10)
Sodium: 141 mEq/L (ref 135–145)
Total Bilirubin: 1 mg/dL (ref 0.3–1.2)
Total Protein: 5.7 g/dL — ABNORMAL LOW (ref 6.0–8.3)

## 2013-01-21 LAB — CBC
HCT: 33.6 % — ABNORMAL LOW (ref 36.0–46.0)
Hemoglobin: 11.7 g/dL — ABNORMAL LOW (ref 12.0–15.0)
MCH: 30.2 pg (ref 26.0–34.0)
MCHC: 34.8 g/dL (ref 30.0–36.0)
MCV: 86.6 fL (ref 78.0–100.0)
Platelets: 155 10*3/uL (ref 150–400)
RBC: 3.88 MIL/uL (ref 3.87–5.11)
RDW: 13.2 % (ref 11.5–15.5)

## 2013-01-21 LAB — LIPID PANEL
HDL: 49 mg/dL (ref >39)
LDL Cholesterol: 61 mg/dL (ref 0–99)
Triglycerides: 141 mg/dL (ref <150)

## 2013-01-21 LAB — TSH: TSH: 2.652 u[IU]/mL (ref 0.350–4.500)

## 2013-02-03 ENCOUNTER — Telehealth: Payer: Self-pay | Admitting: *Deleted

## 2013-02-03 NOTE — Telephone Encounter (Signed)
Message copied by Gaynelle Cage on Mon Feb 03, 2013 11:16 AM ------      Message from: Nicki Guadalajara A      Created: Sat Feb 01, 2013  4:22 PM       Glu inc; anemia better; lipids good ------

## 2013-02-03 NOTE — Telephone Encounter (Signed)
Gave patient's son lab results.

## 2013-03-12 ENCOUNTER — Telehealth: Payer: Self-pay | Admitting: Cardiovascular Disease

## 2013-03-12 MED ORDER — RANOLAZINE ER 500 MG PO TB12: 500.0000 mg | ORAL_TABLET | Freq: Two times a day (BID) | ORAL | Status: DC

## 2013-03-12 NOTE — Telephone Encounter (Signed)
Son came by with paperwork, was incomplete.  Asked him to get appropriate documents and we will send them in.  Gave samples of Ranexa for now

## 2013-03-12 NOTE — Telephone Encounter (Signed)
Wants to know if he can drop off some paperwork by here so she can get help with her Renexa please?.Marland Kitchen

## 2013-03-22 ENCOUNTER — Other Ambulatory Visit: Payer: Self-pay | Admitting: Cardiovascular Disease

## 2013-03-24 NOTE — Telephone Encounter (Signed)
Rx was sent to pharmacy electronically. 

## 2013-04-09 ENCOUNTER — Telehealth: Payer: Self-pay | Admitting: Cardiovascular Disease

## 2013-04-09 MED ORDER — RANOLAZINE ER 500 MG PO TB12: 500.0000 mg | ORAL_TABLET | Freq: Two times a day (BID) | ORAL | Status: DC

## 2013-04-09 NOTE — Telephone Encounter (Signed)
Need some samples Of Ranexa 500 mg please.

## 2013-04-09 NOTE — Telephone Encounter (Signed)
Patient's son notified that samples will be at front desk

## 2013-04-24 ENCOUNTER — Other Ambulatory Visit: Payer: Self-pay | Admitting: Cardiovascular Disease

## 2013-04-24 ENCOUNTER — Ambulatory Visit: Payer: Medicare Other | Admitting: Cardiovascular Disease

## 2013-05-01 ENCOUNTER — Encounter: Payer: Self-pay | Admitting: Cardiovascular Disease

## 2013-05-01 ENCOUNTER — Ambulatory Visit (INDEPENDENT_AMBULATORY_CARE_PROVIDER_SITE_OTHER): Payer: Medicare Other | Admitting: Cardiovascular Disease

## 2013-05-01 VITALS — BP 142/80 | HR 66 | Ht <= 58 in | Wt 79.4 lb

## 2013-05-01 DIAGNOSIS — E785 Hyperlipidemia, unspecified: Secondary | ICD-10-CM

## 2013-05-01 DIAGNOSIS — I10 Essential (primary) hypertension: Secondary | ICD-10-CM

## 2013-05-01 DIAGNOSIS — I2510 Atherosclerotic heart disease of native coronary artery without angina pectoris: Secondary | ICD-10-CM

## 2013-05-01 DIAGNOSIS — R0602 Shortness of breath: Secondary | ICD-10-CM

## 2013-05-01 DIAGNOSIS — R600 Localized edema: Secondary | ICD-10-CM

## 2013-05-01 DIAGNOSIS — R609 Edema, unspecified: Secondary | ICD-10-CM

## 2013-05-01 DIAGNOSIS — N182 Chronic kidney disease, stage 2 (mild): Secondary | ICD-10-CM

## 2013-05-01 MED ORDER — TORSEMIDE 20 MG PO TABS: ORAL_TABLET | ORAL | Status: DC

## 2013-05-01 MED ORDER — ISOSORBIDE MONONITRATE ER 60 MG PO TB24: ORAL_TABLET | ORAL | Status: DC

## 2013-05-01 NOTE — Patient Instructions (Signed)
Your physician has recommended you make the following change in your medication: increase the isosrbide to 60 mg in the morning and 30 mg in the PM.  TAKE THE TORSEMIDE AS DIRECTED BY DR. Tresa EndoKELLY.  Your physician recommends that you schedule a follow-up appointment in: 4 months.

## 2013-05-04 ENCOUNTER — Encounter: Payer: Self-pay | Admitting: Cardiovascular Disease

## 2013-05-04 NOTE — Progress Notes (Signed)
Patient ID: Julia CornerRebecca M Taddeo, female   DOB: [birth year], 80 y.o.   MRN: [id number]      HPI: Julia [name] is a 80 y.o. female presents for cardiology evaluation. I last saw her approximately 4 months ago.  Julia [name] has established coronary disease with documented subtotal/total LAD occlusion in a very calcified LAD system. Initial attempts at performing intervention across the total occlusion were unsuccessful. She has been on medical therapy and has been fairly stable. Additional problems include hypertension, type 2 diabetes mellitus, multilevel cervical spondylosis,. She did develop bradycardia necessitating reduction of her beta blockade regimen. Recently, she has remained stable with the addition of Ranexa added to her medical regimen and is not having significant chest pain episodes. She tells me she may ultimately require cataract surgery.  She does admit to occasional episodes of chest fluttering when she gets tired. She has experienced shortness of breath with activity. This seemed to have improved it she has taken nitroglycerin. She is unaware of presyncope or syncope.  Past Medical History  Diagnosis Date  . Hypertension   . GERD (gastroesophageal reflux disease)   . Edema leg 12/06/2011  . Hyperlipidemia 12/06/2011  . CAD (coronary artery disease), severe single vessel LAD disease, nl. EF 12/08/2011    Unsuccessful PCI  . Shortness of breath     "all the time since last Friday" (12/12/2011)  . Type II diabetes mellitus   . CKD (chronic kidney disease) stage 2, GFR 60-89 ml/min 12/06/2011  . Stroke     "several small strokes"; denies residual  (12/12/2011)  . Arthritis     "all my joints" (12/12/2011)  . Anorexia   . Failure to thrive   . Memory loss     Past Surgical History  Procedure Laterality Date  . Pseudoaneurysm repair  12/12/2011    "no surgery; did ultrasonic; right femoral compression"  . Appendectomy    . Cholecystectomy    . Colectomy      "took out  27 1/2 inches" (12/12/2011)  . Lumbar disc surgery      "2 discs removed, spacer put in" (12/12/2011)  . Carpal tunnel release      bilaterally  . Hammer toe surgery      right  . Abdominal hysterectomy    . Back surgery    . Coronary angioplasty with stent placement  12/08/2011    "failed"  . Cardiac catheterization  12/07/2011    No Known Allergies  Current Outpatient Prescriptions  Medication Sig Dispense Refill  . albuterol (PROVENTIL) (2.5 MG/3ML) 0.083% nebulizer solution Take 2.5 mg by nebulization every 4 (four) hours as needed for wheezing or shortness of breath.      Marland Kitchen. aspirin 81 MG chewable tablet Chew 81 mg by mouth daily.      Marland Kitchen. atorvastatin (LIPITOR) 20 MG tablet TAKE 1 TABLET BY MOUTH DAILY AS DIRECTED  90 tablet  2  . glimepiride (AMARYL) 2 MG tablet Take 4 mg by mouth daily with breakfast.      . levETIRAcetam (KEPPRA) 750 MG tablet Take 1 tablet (750 mg total) by mouth 2 (two) times daily.  60 tablet  11  . lisinopril (PRINIVIL,ZESTRIL) 2.5 MG tablet Take 1 tablet (2.5 mg total) by mouth daily.  30 tablet  6  . Melatonin 3 MG TABS Take 0.5 tablets by mouth.      . metoprolol (LOPRESSOR) 50 MG tablet Take 25 mg by mouth 2 (two) times daily.       .Marland Kitchen  Multiple Vitamin (MULTIVITAMIN) tablet Take 1 tablet by mouth daily.      . nitroGLYCERIN (NITROSTAT) 0.4 MG SL tablet Place 1 tablet (0.4 mg total) under the tongue every 5 (five) minutes as needed for chest pain.  25 tablet  3  . omeprazole (PRILOSEC) 20 MG capsule Take 20 mg by mouth daily.      . ranolazine (RANEXA) 500 MG 12 hr tablet Take 1 tablet (500 mg total) by mouth 2 (two) times daily.  56 tablet  0  . isosorbide mononitrate (IMDUR) 60 MG 24 hr tablet Take 1 tablet in the morning and 1/2 tablet in the PM  45 tablet  11  . torsemide (DEMADEX) 20 MG tablet Take 20 mg in the morning on odd days and 20 mg twice daily on even days.  60 tablet  6   No current facility-administered medications for this visit.     History   Social History  . Marital Status: Widowed    Spouse Name: N/A    Number of Children: 3  . Years of Education: N/A   Occupational History  . Retired    Social History Main Topics  . Smoking status: Never Smoker   . Smokeless tobacco: Never Used  . Alcohol Use: No  . Drug Use: No  . Sexual Activity: No   Other Topics Concern  . Not on file   Social History Narrative  . No narrative on file    Family History  Problem Relation Age of Onset  . Leukemia Brother   . Cancer Brother     ?  . Diabetes      Both Maternal and Paternal sides    ROS is negative for fevers, chills or night sweats. She does wear glasses. She has cataracts and is in need for future surgery. She denies change in hearing. She is unaware of lymphadenopathy. She denies PND or orthopnea. She denies cough or increased sputum production. She denies wheezing. There is no presyncope or syncope. There is exertional dyspnea. She has class I -II NYHA  angina symptoms. She denies abdominal complaints. There is no nausea or vomiting. She denies indigestion. She denies bleeding. She denies urinary symptoms. There is no paresthesias. She denies ulcers. She does note some lower edema left leg greater than right.  She denies difficulty with sleep. There is  diabetes. She denies cold or heat intolerance. She denies any seizure activity. Other comprehensive 14 point system review is negative.  PE BP 142/80  Pulse 66  Ht 4\' 8"  (1.422 m)  Wt 79 lb 6.4 oz (36.016 kg)  BMI 17.81 kg/m2  Repeat blood pressure taken by me was 170/82 General: Alert, oriented, no distress.  Skin: normal turgor, no rashes HEENT: Normocephalic, atraumatic. Pupils round and reactive; sclera anicteric;no lid lag.  Nose without nasal septal hypertrophy Mouth/Parynx benign; Mallinpatti scale 2 Neck: No JVD, no carotid bruits with normal carotid upstroke. Chest wall: Nontender to palpation Lungs: clear to ausculatation and percussion;  no wheezing or rales Heart: PMI is not displaced; RRR, s1 s2 normal; 1-2/6 systolic murmur. No S3 gallop. No diastolic murmur. No rubs thrills or heaves. Abdomen: soft, nontender; no hepatosplenomehaly, BS+; abdominal aorta nontender and not dilated by palpation. Back: No CVA tenderness Pulses 2+ Extremities:  1+ left ankle edema and trace on the right, no significant right ankle edema no clubbing cyanosis. Homan's sign negative  Neurologic: grossly nonfocal Psychologic: normal affect and mood.  ECG emphases independently read by me): Normal sinus  rhythm at 66 beats per minute. Nonspecific ST-T changes. Normal intervals.  Prior 12/24/2012 ECG: Sinus rhythm at 67 beats per minute. No ectopy. QTc interval 431 ms  LABS:  BMET    Component Value Date/Time   NA 141 01/21/2013 1006   K 4.3 01/21/2013 1006   CL 101 01/21/2013 1006   CO2 33* 01/21/2013 1006   GLUCOSE 190* 01/21/2013 1006   BUN 11 01/21/2013 1006   CREATININE 0.93 01/21/2013 1006   CREATININE 0.76 06/02/2012 0830   CALCIUM 9.3 01/21/2013 1006   GFRNONAA 76* 06/02/2012 0830   GFRAA 89* 06/02/2012 0830     Hepatic Function Panel     Component Value Date/Time   PROT 5.7* 01/21/2013 1006   ALBUMIN 3.5 01/21/2013 1006   AST 15 01/21/2013 1006   ALT 13 01/21/2013 1006   ALKPHOS 49 01/21/2013 1006   BILITOT 1.0 01/21/2013 1006     CBC    Component Value Date/Time   WBC 4.9 01/21/2013 1006   RBC 3.88 01/21/2013 1006   HGB 11.7* 01/21/2013 1006   HCT 33.6* 01/21/2013 1006   PLT 155 01/21/2013 1006   MCV 86.6 01/21/2013 1006   MCH 30.2 01/21/2013 1006   MCHC 34.8 01/21/2013 1006   RDW 13.2 01/21/2013 1006   LYMPHSABS 1.6 05/28/2012 1427   MONOABS 0.4 05/28/2012 1427   EOSABS 0.1 05/28/2012 1427   BASOSABS 0.0 05/28/2012 1427     BNP    Component Value Date/Time   PROBNP 2859.0* 05/28/2012 1855    Lipid Panel     Component Value Date/Time   CHOL 138 01/21/2013 1006   TRIG 141 01/21/2013 1006   HDL 49 01/21/2013 1006   CHOLHDL  2.8 01/21/2013 1006   VLDL 28 01/21/2013 1006   LDLCALC 61 01/21/2013 1006     RADIOLOGY: No results found.    ASSESSMENT AND PLAN:   Julia [name] is 80 year old female with known coronary artery disease and documented subtotal/total very calcified LAD occlusion. She has been on medical therapy and has derived benefit with the addition of were ranexa 500 mg twice a day added to her medical regimen. When I last saw her, she was significantly hypertensive with systolic blood pressure in the 160-170 range and I added low-dose lisinopril at just 2.5 mg daily. She has tolerated this well. Her creatinine in December remained stable at 0.93. She has been taking her isosorbide 60 mg in the morning. I'm recommending she slightly titrate this to 60 mg in the morning and 30 mg at night. Or shortness of breath has been nitrate responsive. I've also suggested that she can take her to torsemide 20 mg in the morning alternating days with 20 twice a day depending upon leg swelling. She is stable to undergo cataract surgery if necessary. I will see her in 4 months for cardiology reevaluation.  [name], MD, [facility]  05/04/2013 1:10 PM

## 2013-06-05 ENCOUNTER — Telehealth: Payer: Self-pay | Admitting: Cardiovascular Disease

## 2013-06-05 NOTE — Telephone Encounter (Signed)
Need some samples of Ranexa please.

## 2013-06-05 NOTE — Telephone Encounter (Signed)
Returned call.  Son informed no samples available.  Verbalized understanding and agreed to call back on Monday.

## 2013-06-11 ENCOUNTER — Telehealth: Payer: Self-pay | Admitting: Cardiovascular Disease

## 2013-06-11 NOTE — Telephone Encounter (Signed)
Would like some samples of Ranexa 500 mg please. °

## 2013-06-11 NOTE — Telephone Encounter (Signed)
Notified that we do not have samples of ranexa 500mg  at this time - advised them to call back early next week

## 2013-06-17 ENCOUNTER — Telehealth: Payer: Self-pay | Admitting: Cardiovascular Disease

## 2013-06-17 MED ORDER — RANOLAZINE ER 500 MG PO TB12: 500.0000 mg | ORAL_TABLET | Freq: Two times a day (BID) | ORAL | Status: DC

## 2013-06-17 NOTE — Telephone Encounter (Signed)
Returned call and Molly MaduroRobert informed samples left at front desk.  Verbalized understanding and agreed w/ plan.

## 2013-06-17 NOTE — Telephone Encounter (Signed)
Wants to check and see if we got samples of Ranexa 500mg  in.  Checked last week and we ere out.  Please call.

## 2013-06-22 ENCOUNTER — Other Ambulatory Visit: Payer: Self-pay | Admitting: Cardiovascular Disease

## 2013-06-24 NOTE — Telephone Encounter (Signed)
Rx was sent to pharmacy electronically. Clarified medications with son.

## 2013-07-15 ENCOUNTER — Telehealth: Payer: Self-pay | Admitting: Cardiovascular Disease

## 2013-07-15 NOTE — Telephone Encounter (Signed)
Would like some samples of Ranexa 500 mg please. °

## 2013-07-16 ENCOUNTER — Other Ambulatory Visit: Payer: Self-pay | Admitting: Cardiovascular Disease

## 2013-07-16 MED ORDER — RANOLAZINE ER 500 MG PO TB12: 500.0000 mg | ORAL_TABLET | Freq: Two times a day (BID) | ORAL | Status: DC

## 2013-07-16 NOTE — Telephone Encounter (Signed)
Rx was sent to pharmacy electronically. 

## 2013-07-16 NOTE — Telephone Encounter (Signed)
Samples provided. Left message on son's VM informing him that samples are at front desk.

## 2013-07-21 ENCOUNTER — Telehealth: Payer: Self-pay | Admitting: *Deleted

## 2013-07-21 NOTE — Telephone Encounter (Signed)
Faxed surgical clearance- ok to have scheduled cataract surgery scheduled on 08/05/13 to be done by Dr. Arcelia Jew.

## 2013-08-11 ENCOUNTER — Other Ambulatory Visit: Payer: Self-pay | Admitting: *Deleted

## 2013-08-11 ENCOUNTER — Telehealth: Payer: Self-pay | Admitting: Cardiovascular Disease

## 2013-08-11 MED ORDER — RANOLAZINE ER 500 MG PO TB12: 500.0000 mg | ORAL_TABLET | Freq: Two times a day (BID) | ORAL | Status: DC

## 2013-08-11 NOTE — Telephone Encounter (Signed)
Patient's son notified that 1 month of Ranexa will be left at the front desk for him to pick up.

## 2013-08-11 NOTE — Telephone Encounter (Signed)
Wants samples  Ranexa 500 mg 2 daily  Please call

## 2013-09-09 ENCOUNTER — Telehealth: Payer: Self-pay | Admitting: Cardiovascular Disease

## 2013-09-09 MED ORDER — RANOLAZINE ER 500 MG PO TB12: 500.0000 mg | ORAL_TABLET | Freq: Two times a day (BID) | ORAL | Status: DC

## 2013-09-09 NOTE — Telephone Encounter (Signed)
Spoke to son . Informed  RANEXA samples are available Son will pick up.

## 2013-09-09 NOTE — Telephone Encounter (Signed)
Would like samples of Ranexa 500 mg.

## 2013-09-13 ENCOUNTER — Other Ambulatory Visit (HOSPITAL_COMMUNITY): Payer: Self-pay | Admitting: Cardiovascular Disease

## 2013-09-15 NOTE — Telephone Encounter (Signed)
Rx refill sent to patient pharmacy   

## 2013-10-08 ENCOUNTER — Encounter: Payer: Self-pay | Admitting: Cardiovascular Disease

## 2013-10-08 ENCOUNTER — Ambulatory Visit (INDEPENDENT_AMBULATORY_CARE_PROVIDER_SITE_OTHER): Payer: Medicare Other | Admitting: Cardiovascular Disease

## 2013-10-08 VITALS — BP 152/72 | HR 63 | Ht <= 58 in | Wt 77.8 lb

## 2013-10-08 DIAGNOSIS — E1122 Type 2 diabetes mellitus with diabetic chronic kidney disease: Secondary | ICD-10-CM

## 2013-10-08 DIAGNOSIS — R5381 Other malaise: Secondary | ICD-10-CM

## 2013-10-08 DIAGNOSIS — I2510 Atherosclerotic heart disease of native coronary artery without angina pectoris: Secondary | ICD-10-CM

## 2013-10-08 DIAGNOSIS — E782 Mixed hyperlipidemia: Secondary | ICD-10-CM

## 2013-10-08 DIAGNOSIS — R5383 Other fatigue: Secondary | ICD-10-CM

## 2013-10-08 DIAGNOSIS — N183 Chronic kidney disease, stage 3 unspecified: Secondary | ICD-10-CM

## 2013-10-08 DIAGNOSIS — R0602 Shortness of breath: Secondary | ICD-10-CM

## 2013-10-08 DIAGNOSIS — N189 Chronic kidney disease, unspecified: Secondary | ICD-10-CM

## 2013-10-08 DIAGNOSIS — E1129 Type 2 diabetes mellitus with other diabetic kidney complication: Secondary | ICD-10-CM

## 2013-10-08 MED ORDER — LISINOPRIL 5 MG PO TABS: 5.0000 mg | ORAL_TABLET | Freq: Every day | ORAL | Status: DC

## 2013-10-08 NOTE — Progress Notes (Signed)
Patient ID: Julia [name], female   DOB: 02-15-1927, 80 y.o.   MRN: [id number]      HPI: Julia [name] is a 80 y.o. female presents for cardiology evaluation. I last saw her 5 months ago.  Julia [name] has established coronary disease with documented subtotal/total LAD occlusion in a very calcified LAD system. Initial attempts at performing intervention across the total occlusion were unsuccessful. She has been on medical therapy and has been fairly stable. Additional problems include hypertension, type 2 diabetes mellitus, multilevel cervical spondylosis,. She did develop bradycardia necessitating reduction of her beta blockade regimen. Recently, she has remained stable with the addition of Ranexa added to her medical regimen and is not having significant chest pain episodes.  He admits to experiencing episodes of increasing shortness of breath.  She also has experienced episodic right shoulder pain, which is not exertionally precipitated.  She tells me she had a chest x-ray in May 2015 when she was seen by Julia [name].  Her last echo Doppler study was done in April 2014, which showed hyperdynamic systolic function with an EF of 65-70%.  She had mild left ventricular hypertrophy.  There is aortic valve sclerosis without stenosis and moderate mitral regurgitation.  There was mild pulmonary hypertension with an estimated PA pressure of 41 mm.  She presents today with a chief complaint of shortness of breath.  She denies significant edema.  She denies presyncope or syncope.  She denies neurologic symptoms.  Past Medical History  Diagnosis Date  . Hypertension   . GERD (gastroesophageal reflux disease)   . Edema leg 12/06/2011  . Hyperlipidemia 12/06/2011  . CAD (coronary artery disease), severe single vessel LAD disease, nl. EF 12/08/2011    Unsuccessful PCI  . Shortness of breath     "all the time since last Friday" (12/12/2011)  . Type II diabetes mellitus   . CKD (chronic kidney  disease) stage 2, GFR 60-89 ml/min 12/06/2011  . Stroke     "several small strokes"; denies residual  (12/12/2011)  . Arthritis     "all my joints" (12/12/2011)  . Anorexia   . Failure to thrive   . Memory loss     Past Surgical History  Procedure Laterality Date  . Pseudoaneurysm repair  12/12/2011    "no surgery; did ultrasonic; right femoral compression"  . Appendectomy    . Cholecystectomy    . Colectomy      "took out 27 1/2 inches" (12/12/2011)  . Lumbar disc surgery      "2 discs removed, spacer put in" (12/12/2011)  . Carpal tunnel release      bilaterally  . Hammer toe surgery      right  . Abdominal hysterectomy    . Back surgery    . Coronary angioplasty with stent placement  12/08/2011    "failed"  . Cardiac catheterization  12/07/2011    No Known Allergies  Current Outpatient Prescriptions  Medication Sig Dispense Refill  . aspirin 81 MG chewable tablet Chew 81 mg by mouth daily.      Marland Kitchen atorvastatin (LIPITOR) 20 MG tablet TAKE 1 TABLET BY MOUTH DAILY AS DIRECTED  90 tablet  2  . glimepiride (AMARYL) 2 MG tablet Take 4 mg by mouth daily with breakfast.      . isosorbide mononitrate (IMDUR) 60 MG 24 hr tablet Take 1 tablet in the morning and 1/2 tablet in the PM  45 tablet  11  . levETIRAcetam (KEPPRA) 750 MG tablet Take  1 tablet (750 mg total) by mouth 2 (two) times daily.  60 tablet  11  . lisinopril (PRINIVIL,ZESTRIL) 2.5 MG tablet TAKE 1 TABLET BY MOUTH DAILY  30 tablet  9  . Melatonin 3 MG TABS Take 0.5 tablets by mouth.      . metoprolol (LOPRESSOR) 25 MG tablet Take 1 tablet (25 mg total) by mouth 2 (two) times daily. <previously had  tablets>  180 tablet  0  . Multiple Vitamin (MULTIVITAMIN) tablet Take 1 tablet by mouth daily.      Marland Kitchen NITROSTAT 0.4 MG SL tablet DISSOLVE 1 TABLET UNDER THE TONGUE EVERY 5 MINUTES AS NEEDED FOR CHEST PAIN  25 tablet  0  . omeprazole (PRILOSEC) 20 MG capsule Take 20 mg by mouth daily.      . ranolazine (RANEXA) 500 MG  12 hr tablet Take 1 tablet (500 mg total) by mouth 2 (two) times daily.  56 tablet  0  . ranolazine (RANEXA) 500 MG 12 hr tablet Take 1 tablet (500 mg total) by mouth 2 (two) times daily.  56 tablet  0  . torsemide (DEMADEX) 20 MG tablet Take 20 mg in the morning on odd days and 20 mg twice daily on even days.  60 tablet  6   No current facility-administered medications for this visit.    History   Social History  . Marital Status: Widowed    Spouse Name: N/A    Number of Children: 3  . Years of Education: N/A   Occupational History  . Retired    Social History Main Topics  . Smoking status: Never Smoker   . Smokeless tobacco: Never Used  . Alcohol Use: No  . Drug Use: No  . Sexual Activity: No   Other Topics Concern  . Not on file   Social History Narrative  . No narrative on file    Family History  Problem Relation Age of Onset  . Leukemia Brother   . Cancer Brother     ?  . Diabetes      Both Maternal and Paternal sides   ROS General: Negative; No fevers, chills, or night sweats;  HEENT: Negative; No changes in vision or hearing, sinus congestion, difficulty swallowing Pulmonary: Positive for shortness of breath with activity No cough, wheezing,  hemoptysis Cardiovascular: See history of present illness No chest pain, presyncope, syncope, palpitations GI: Negative; No nausea, vomiting, diarrhea, or abdominal pain GU: Negative; No dysuria, hematuria, or difficulty voiding Musculoskeletal: Negative; no myalgias, joint pain, or weakness Hematologic/Oncology: Negative; no easy bruising, bleeding Endocrine: Negative; no heat/cold intolerance; no diabetes Neuro: Negative; no changes in balance, headaches Skin: Negative; No rashes or skin lesions Psychiatric: Negative; No behavioral problems, depression Sleep: Negative; No snoring, daytime sleepiness, hypersomnolence, bruxism, restless legs, hypnogognic hallucinations, no cataplexy Other comprehensive 14 point system  review is negative.   PE BP 152/72  Pulse 63  Ht  (1.422 m)  Wt 77 lb 12.8 oz (35.29 kg)  BMI 17.45 kg/m2  Repeat blood pressure taken by me was 170/82 General: Alert, oriented, no distress.  Skin: normal turgor, no rashes HEENT: Normocephalic, atraumatic. Pupils round and reactive; sclera anicteric;no lid lag.  Nose without nasal septal hypertrophy Mouth/Parynx benign; Mallinpatti scale 2 Neck: No JVD, no carotid bruits with normal carotid upstroke. Chest wall: Nontender to palpation Lungs: clear to ausculatation and percussion; no wheezing or rales Heart: PMI is not displaced; RRR, s1 s2 normal; 1-2/6 systolic murmur. No S3 gallop. No diastolic  murmur. No rubs thrills or heaves. Abdomen: soft, nontender; no hepatosplenomehaly, BS+; abdominal aorta nontender and not dilated by palpation. Back: No CVA tenderness Pulses 2+ Extremities:  1+ left ankle edema and trace on the right, no significant right ankle edema no clubbing cyanosis. Homan's sign negative  Neurologic: grossly nonfocal Psychologic: normal affect and mood.  ECG (independently read by me): Normal sinus rhythm.  Poor progression V1 through V3, concordant with anteroseptal infarct.  PR interval 208 ms.  QT interval 456 ms.  05/01/2013 ECG (independently read by me): Normal sinus rhythm at 66 beats per minute. Nonspecific ST-T changes. Normal intervals.  Prior 12/24/2012 ECG: Sinus rhythm at 67 beats per minute. No ectopy. QTc interval 431 ms  LABS:  BMET    Component Value Date/Time   NA 141 01/21/2013 1006   K 4.3 01/21/2013 1006   CL 101 01/21/2013 1006   CO2 33* 01/21/2013 1006   GLUCOSE 190* 01/21/2013 1006   BUN 11 01/21/2013 1006   CREATININE 0.93 01/21/2013 1006   CREATININE 0.76 06/02/2012 0830   CALCIUM 9.3 01/21/2013 1006   GFRNONAA 76* 06/02/2012 0830   GFRAA 89* 06/02/2012 0830     Hepatic Function Panel     Component Value Date/Time   PROT 5.7* 01/21/2013 1006   ALBUMIN 3.5 01/21/2013 1006   AST  15 01/21/2013 1006   ALT 13 01/21/2013 1006   ALKPHOS 49 01/21/2013 1006   BILITOT 1.0 01/21/2013 1006     CBC    Component Value Date/Time   WBC 4.9 01/21/2013 1006   RBC 3.88 01/21/2013 1006   HGB 11.7* 01/21/2013 1006   HCT 33.6* 01/21/2013 1006   PLT 155 01/21/2013 1006   MCV 86.6 01/21/2013 1006   MCH 30.2 01/21/2013 1006   MCHC 34.8 01/21/2013 1006   RDW 13.2 01/21/2013 1006   LYMPHSABS 1.6 05/28/2012 1427   MONOABS 0.4 05/28/2012 1427   EOSABS 0.1 05/28/2012 1427   BASOSABS 0.0 05/28/2012 1427     BNP    Component Value Date/Time   PROBNP 2859.0* 05/28/2012 1855    Lipid Panel     Component Value Date/Time   CHOL 138 01/21/2013 1006   TRIG 141 01/21/2013 1006   HDL 49 01/21/2013 1006   CHOLHDL 2.8 01/21/2013 1006   VLDL 28 01/21/2013 1006   LDLCALC 61 01/21/2013 1006     RADIOLOGY: No results found.    ASSESSMENT AND PLAN:   Ms. Rehfeldt is 80 year old female with known coronary artery disease and documented subtotal/total very calcified LAD occlusion. She has been on medical therapy and has derived benefit with the addition of were ranexa 500 mg twice a day added to her medical regimen.  She has a history of attention for which she was started on low-dose lisinopril.  Her blood pressure today is still somewhat elevated at 152/72.  When rechecked by me.  This was 160/70.  She had documented hyperdynamic LV function on her echo last year.  I am recommending she further titrate her lisinopril to 5 mg daily.  Several weeks.  She will have followup blood work to make certain she is tolerating this , and we'll check a progressive metabolic panel, CBC, TSH, and lipid studies on current therapy.  I will see her in 6 months for followup evaluation or sooner if problems arise.  [name], MD, [facility]  10/08/2013 4:19 PM

## 2013-10-08 NOTE — Patient Instructions (Signed)
Your physician has recommended you make the following change in your medication: increase the lisinopril to 5 mg daily. Take (2) of the 2.5 mg tablets daily until finished. Then fill the 5 mg prescription given to you today at the tie of your visit.  Your physician recommends that you return for lab work in: 2-3 weeks after starting new dose of lisinopril.  Your physician wants you to follow-up in: 6 months. You will receive a reminder letter in the mail two months in advance. If you don't receive a letter, please call our office to schedule the follow-up appointment.

## 2013-10-11 ENCOUNTER — Other Ambulatory Visit: Payer: Self-pay | Admitting: Cardiovascular Disease

## 2013-10-13 NOTE — Telephone Encounter (Signed)
Rx was sent to pharmacy electronically. 

## 2013-11-10 ENCOUNTER — Telehealth: Payer: Self-pay | Admitting: Cardiovascular Disease

## 2013-11-10 NOTE — Telephone Encounter (Signed)
Robert is calling for his Molly Maduroher to see if she can get some samples of Ranexa  . Please Call    Thanks

## 2013-11-10 NOTE — Telephone Encounter (Signed)
Spoke with pt son, samples placed at the front desk for pick up

## 2013-11-11 ENCOUNTER — Other Ambulatory Visit (HOSPITAL_COMMUNITY): Payer: Self-pay | Admitting: Cardiovascular Disease

## 2013-11-11 NOTE — Telephone Encounter (Signed)
Rx was sent to pharmacy electronically. 

## 2013-12-05 ENCOUNTER — Telehealth: Payer: Self-pay | Admitting: Cardiovascular Disease

## 2013-12-05 NOTE — Telephone Encounter (Signed)
Called EC, patient's son, Molly MaduroRobert to notify him that we currently do not have samples. He reported that the patient had enough to last until Saturday's first dose. He also reported Dr Tresa EndoKelly has had her on 1,000 mg in the past and that she still has those tablets left over. Let patient know I would speak with our pharmacist, Phylis BougieKristen Alvstad, to see what options the patient had.  Per Wolfgang PhoenixK Alvstad, PharmD, patient can take one 1,000 mg tablet daily and see how she feels. This medication cannot be cut in half due to the way this medication works. It is meant to be a 12 hour medication. If the 1,000 mg tablet daily does not help, the only option will be for the prescription to be filled and picked up at the pharmacy.  Patient's EC understood and agreed with plan.

## 2013-12-05 NOTE — Telephone Encounter (Signed)
Molly MaduroRobert called in wanting to see if his mother could get some samples of Ranexa 500mg  . Please call   Thanks

## 2013-12-09 ENCOUNTER — Telehealth: Payer: Self-pay | Admitting: Cardiovascular Disease

## 2013-12-09 NOTE — Telephone Encounter (Signed)
Calling to see if we any samples of Ranexa 500 mg .Marland Kitchen.Need to take them twice a day . Medication is too expensive. Can call brother Molly Maduro(Robert) [phone]

## 2013-12-09 NOTE — Telephone Encounter (Signed)
Spoke with robert, aware samples at the front desk for pick up

## 2013-12-23 ENCOUNTER — Other Ambulatory Visit: Payer: Self-pay | Admitting: Cardiovascular Disease

## 2013-12-24 NOTE — Telephone Encounter (Signed)
Rx was sent to pharmacy electronically. 

## 2014-01-15 ENCOUNTER — Other Ambulatory Visit: Payer: Self-pay | Admitting: Cardiovascular Disease

## 2014-01-16 NOTE — Telephone Encounter (Signed)
Rx was sent to pharmacy electronically. 

## 2014-01-22 ENCOUNTER — Encounter (HOSPITAL_COMMUNITY): Payer: Self-pay | Admitting: Cardiology

## 2014-02-02 ENCOUNTER — Telehealth: Payer: Self-pay | Admitting: Cardiovascular Disease

## 2014-02-02 MED ORDER — RANOLAZINE ER 500 MG PO TB12: 500.0000 mg | ORAL_TABLET | Freq: Two times a day (BID) | ORAL | Status: DC

## 2014-02-02 NOTE — Telephone Encounter (Signed)
Pt would like some samples of Ranexa 500 mg please.

## 2014-02-02 NOTE — Telephone Encounter (Signed)
Left VM for son that samples are available for pick up for his mother (patient)

## 2014-02-04 ENCOUNTER — Telehealth: Payer: Self-pay | Admitting: Cardiovascular Disease

## 2014-02-04 NOTE — Telephone Encounter (Signed)
Molly MaduroRobert states that Mrs. Cheree DittoGraham has had to use her Nitrostat recently and it is not working as it should.  She used 1 yesterday and it did not help her at all.

## 2014-02-04 NOTE — Telephone Encounter (Signed)
NA

## 2014-02-04 NOTE — Telephone Encounter (Addendum)
  Haylen L. Cheree DittoGraham is a 80 y.o. female complaining of mild chest pain. Onset was intermittent, and began 4 weeks ago, and lasting to the present time. She describes her pain as palpitations, heart feels like it's "jumping around", and does not radiate. Pain is triggered by: unknown; awoke with the pain. She has tried nitroglycerin SL tablets and rest for pain, with no relief. She is alert. SOB: moderate-severe (. Other symptoms: anxious. Other history: CAD, elevated cholesterol, hypertension and past problems with exertional SOB.  Son called due to concern over continued SOB. Pt has been SOB "on and off" x1 month. Son states she has refused to see a physician, though he would like to have her evaluated, tomorrow if possible. He states she describes intermittent "chest pain" too, describes it as her heart feeling like "fluttering" or "jumping around", this has been associated w/ more pronounced SOB. Son is caregiver.  I spoke w/ patient on phone as well. She sounds audibly SOB. She restated symptoms as reported by son. She voiced that she refused to be evaluated by a physician. When I stated concern about this, she also stated she would refuse treatment in a hospital.  Pt does not want to come in for eval. Can we help by changing her meds? Any other recommendations?

## 2014-02-04 NOTE — Telephone Encounter (Signed)
Left message w/ son/emergency contact in regards to trying to schedule patient for an appt tomorrow. [facility] so we will need to look [facility]

## 2014-02-04 NOTE — Telephone Encounter (Signed)
If the patient is having an acute problem she needs to be seen a physician.  We cannot manage her over the phone.

## 2014-02-04 NOTE — Telephone Encounter (Signed)
Spoke with patient's son, who says that he has again spoken with patient trying to convince her to come in for eval in the office. The patient is, as he states, "vehemently, vehemently against coming into the office because she is afraid she will have to be admitted to the hospital" and by her line of thought, would not be able to come home.   Based on this conversation and the conversation I had with the patient earlier, the patient is refusing to be evaluated at all in the office despite her family's concerns. I did advise her son to update us should she change her mind at any point, and that we could try to fit her in to be seen short notice.  Patient's son voiced understanding.

## 2014-02-23 ENCOUNTER — Other Ambulatory Visit: Payer: Self-pay | Admitting: Cardiovascular Disease

## 2014-02-27 ENCOUNTER — Other Ambulatory Visit: Payer: Self-pay | Admitting: Internal Medicine

## 2014-02-27 DIAGNOSIS — M542 Cervicalgia: Secondary | ICD-10-CM

## 2014-03-07 ENCOUNTER — Other Ambulatory Visit: Payer: Self-pay

## 2014-03-10 ENCOUNTER — Telehealth: Payer: Self-pay | Admitting: Cardiovascular Disease

## 2014-03-10 MED ORDER — RANOLAZINE ER 500 MG PO TB12: 500.0000 mg | ORAL_TABLET | Freq: Two times a day (BID) | ORAL | Status: DC

## 2014-03-10 NOTE — Telephone Encounter (Signed)
Pt would like some samples of Ranexa please. °

## 2014-03-10 NOTE — Telephone Encounter (Signed)
Samples of this drug were given to the patient, quantity 56, Lot Number [id number]  9/18  

## 2014-03-13 ENCOUNTER — Ambulatory Visit: Payer: Self-pay | Admitting: Cardiology

## 2014-03-14 ENCOUNTER — Ambulatory Visit
Admission: RE | Admit: 2014-03-14 | Discharge: 2014-03-14 | Disposition: A | Discharge status: Home / Self Care | Payer: Medicare Other | Source: Ambulatory Visit | Attending: Internal Medicine | Admitting: Internal Medicine

## 2014-03-14 DIAGNOSIS — M542 Cervicalgia: Secondary | ICD-10-CM

## 2014-04-06 ENCOUNTER — Other Ambulatory Visit: Payer: Self-pay | Admitting: Cardiovascular Disease

## 2014-04-06 NOTE — Telephone Encounter (Signed)
Pt would like some samples of Ranexa 500 mg please. °

## 2014-04-06 NOTE — Telephone Encounter (Addendum)
Medication samples have been provided to the patient.  Drug name: Ranexa 500 mg  Qty: 56 tabs LOT: [id number] Exp.Date: 02/2017  Unable to reach patient at this time. No DPR on file - cannot call emergency contact.  Truitt, Chelley 11:48 AM 04/06/2014

## 2014-04-06 NOTE — Telephone Encounter (Signed)
Samples left at front desk for patient pick-up. Patient notified.  [name], Julia 3:33 PM 04/06/2014   Also advised patient to have her son pick up a designated party release form, so in the future we may return his call regarding patient.

## 2014-04-20 ENCOUNTER — Encounter: Payer: Self-pay | Admitting: Cardiovascular Disease

## 2014-04-20 ENCOUNTER — Ambulatory Visit (INDEPENDENT_AMBULATORY_CARE_PROVIDER_SITE_OTHER): Payer: Medicare Other | Admitting: Cardiovascular Disease

## 2014-04-20 VITALS — BP 174/70 | HR 56 | Ht <= 58 in | Wt 79.3 lb

## 2014-04-20 DIAGNOSIS — I2510 Atherosclerotic heart disease of native coronary artery without angina pectoris: Secondary | ICD-10-CM

## 2014-04-20 DIAGNOSIS — I10 Essential (primary) hypertension: Secondary | ICD-10-CM

## 2014-04-20 DIAGNOSIS — I209 Angina pectoris, unspecified: Secondary | ICD-10-CM

## 2014-04-20 DIAGNOSIS — I200 Unstable angina: Secondary | ICD-10-CM

## 2014-04-20 DIAGNOSIS — E785 Hyperlipidemia, unspecified: Secondary | ICD-10-CM

## 2014-04-20 DIAGNOSIS — I2584 Coronary atherosclerosis due to calcified coronary lesion: Secondary | ICD-10-CM

## 2014-04-20 MED ORDER — LISINOPRIL 5 MG PO TABS: ORAL_TABLET | ORAL | Status: DC

## 2014-04-20 MED ORDER — RANOLAZINE ER 500 MG PO TB12: ORAL_TABLET | ORAL | Status: DC

## 2014-04-20 NOTE — Patient Instructions (Signed)
Your physician has recommended you make the following change in your medication:   1.) increase the Ranexa to 1000 mg in the morning and 500 mg at night.  2.) increase the lisinopril to 7.5 mg ( 1& 1/2 tablet) daily.  New prescriptions has been sent to your pharmacy to reflect these changes.   Your physician recommends that you schedule a follow-up appointment in: 3 months or sooner if needed with Dr. Tresa EndoKelly.

## 2014-04-20 NOTE — Progress Notes (Signed)
Patient ID: Julia [name], female   DOB: April 17, 1927, 81 y.o.   MRN: [id number]     HPI: Julia [name] is a 81 y.o. female presents for a 7 month cardiology evaluation.  Julia [name] has established CAD with a subtotal/total LAD occlusion in a very calcified LAD system. Initial attempts at performing intervention across the total occlusion were unsuccessful. She has been on medical therapy and currently is receiving Ranexa 500 mg twice a day, isosorbide, mononitrate 60 mg in the morning and 30 mg in the evening, metoprolol, tartrate 25 mg  twice a day, and  lisinopril 5 mg daily.  Additional problems include hypertension, type 2 diabetes mellitus, multilevel cervical spondylosis,. She  developed bradycardia necessitating reduction of her beta blockade regimen.   Her last echo Doppler study in April 2014 showed hyperdynamic systolic function with an EF of 65-70%.  She had mild left ventricular hypertrophy.  There is aortic valve sclerosis without stenosis and moderate mitral regurgitation.  There was mild pulmonary hypertension with an estimated PA pressure of 41 mm.  She presents today with a chief complaint of experiencing occasional episodes of chest discomfort.  He seemed to be exacerbated when she lifts her arms above her head.  They may occur approximately 1 to at most 2 times per week and are short-lived.  When she has taken nitroglycerin they have relieved with one sublingual nitroglycerin tablet.  She denies significant edema.  She denies presyncope or syncope.  She denies neurologic symptoms.  Past Medical History  Diagnosis Date  . Hypertension   . GERD (gastroesophageal reflux disease)   . Edema leg 12/06/2011  . Hyperlipidemia 12/06/2011  . CAD (coronary artery disease), severe single vessel LAD disease, nl. EF 12/08/2011    Unsuccessful PCI  . Shortness of breath     "all the time since last Friday" (12/12/2011)  . Type II diabetes mellitus   . CKD (chronic kidney disease)  stage 2, GFR 60-89 ml/min 12/06/2011  . Stroke     "several small strokes"; denies residual  (12/12/2011)  . Arthritis     "all my joints" (12/12/2011)  . Anorexia   . Failure to thrive   . Memory loss     Past Surgical History  Procedure Laterality Date  . Pseudoaneurysm repair  12/12/2011    "no surgery; did ultrasonic; right femoral compression"  . Appendectomy    . Cholecystectomy    . Colectomy      "took out 27 1/2 inches" (12/12/2011)  . Lumbar disc surgery      "2 discs removed, spacer put in" (12/12/2011)  . Carpal tunnel release      bilaterally  . Hammer toe surgery      right  . Abdominal hysterectomy    . Back surgery    . Coronary angioplasty with stent placement  12/08/2011    "failed"  . Cardiac catheterization  12/07/2011  . Left heart catheterization with coronary angiogram N/A 12/07/2011    Procedure: LEFT HEART CATHETERIZATION WITH CORONARY ANGIOGRAM;  Surgeon: Leonie Man, MD;  Location: [facility] CATH LAB;  Service: Cardiovascular;  Laterality: N/A;  . Percutaneous coronary stent intervention (pci-s) Bilateral 12/08/2011    Procedure: PERCUTANEOUS CORONARY STENT INTERVENTION (PCI-S);  Surgeon: Troy Sine, MD;  Location: [facility] CATH LAB;  Service: Cardiovascular;  Laterality: Bilateral;    No Known Allergies  Current Outpatient Prescriptions  Medication Sig Dispense Refill  . aspirin 81 MG chewable tablet Chew 81 mg by mouth daily.    Marland Kitchen  atorvastatin (LIPITOR) 20 MG tablet TAKE 1 TABLET BY MOUTH EVERY DAY AS DIRECTED 90 tablet 2  . glimepiride (AMARYL) 2 MG tablet Take 4 mg by mouth daily with breakfast.    . Insulin Detemir (LEVEMIR) 100 UNIT/ML Pen Inject 10 Units into the skin daily with supper.    . isosorbide mononitrate (IMDUR) 60 MG 24 hr tablet Take 1 tablet in the morning and 1/2 tablet in the PM 45 tablet 11  . levETIRAcetam (KEPPRA) 750 MG tablet Take 1 tablet (750 mg total) by mouth 2 (two) times daily. 60 tablet 11  . lisinopril  (PRINIVIL,ZESTRIL) 5 MG tablet Take 1.5 tablets daily 45 tablet 6  . Melatonin 3 MG TABS Take 0.5 tablets by mouth.    . metoprolol tartrate (LOPRESSOR) 25 MG tablet TAKE 1 TABLET BY MOUTH TWICE DAILY 180 tablet 3  . Multiple Vitamin (MULTIVITAMIN) tablet Take 1 tablet by mouth daily.    Marland Kitchen NITROSTAT 0.4 MG SL tablet DISSOLVE 1 TABLET UNDER THE TONGUE EVERY 5 MINUTES AS NEEDED FOR CHEST PAIN 25 tablet 5  . omeprazole (PRILOSEC) 20 MG capsule Take 20 mg by mouth daily.    . ranolazine (RANEXA) 500 MG 12 hr tablet Take 2 tablets in the morning and 1 tablet in the evening 90 tablet 6  . torsemide (DEMADEX) 20 MG tablet TAKE 1 TABLET BY MOUTH EVERY MORNING ON ODD DAYS AND TAKE 1 TABLET BY MOUTH TWICE DAILY ON EVEN DAYS 45 tablet 1  . traMADol (ULTRAM) 50 MG tablet Take 0.5 tablets by mouth every 4 (four) hours.  5   No current facility-administered medications for this visit.    History   Social History  . Marital Status: Widowed    Spouse Name: N/A  . Number of Children: 3  . Years of Education: N/A   Occupational History  . Retired    Social History Main Topics  . Smoking status: Never Smoker   . Smokeless tobacco: Never Used  . Alcohol Use: No  . Drug Use: No  . Sexual Activity: No   Other Topics Concern  . Not on file   Social History Narrative   She is here today with her son.  Family History  Problem Relation Age of Onset  . Leukemia Brother   . Cancer Brother     ?  . Diabetes      Both Maternal and Paternal sides   ROS General: Negative; No fevers, chills, or night sweats;  HEENT: Negative; No changes in vision or hearing, sinus congestion, difficulty swallowing Pulmonary: Positive for shortness of breath with activity No cough, wheezing,  hemoptysis Cardiovascular: See history of present illness. GI: Negative; No nausea, vomiting, diarrhea, or abdominal pain GU: Negative; No dysuria, hematuria, or difficulty voiding Musculoskeletal: Negative; no myalgias,  joint pain, or weakness Hematologic/Oncology: Negative; no easy bruising, bleeding Endocrine: Negative; no heat/cold intolerance; no diabetes Neuro: Negative; no changes in balance, headaches Skin: Negative; No rashes or skin lesions Psychiatric: Negative; No behavioral problems, depression Sleep: Negative; No snoring, daytime sleepiness, hypersomnolence, bruxism, restless legs, hypnogognic hallucinations, no cataplexy Other comprehensive 14 point system review is negative.   PE BP 174/70 mmHg  Pulse 56  Ht 4' 8"  (1.422 m)  Wt 79 lb 4.8 oz (35.97 kg)  BMI 17.79 kg/m2  Repeat blood pressure taken by me was 165/70 General: Alert, oriented, no distress.  Skin: normal turgor, no rashes HEENT: Normocephalic, atraumatic. Pupils round and reactive; sclera anicteric;no lid lag.  Nose without nasal  septal hypertrophy Mouth/Parynx benign; Mallinpatti scale 2 Neck: No JVD, no carotid bruits with normal carotid upstroke. Chest wall: Nontender to palpation Lungs: clear to ausculatation and percussion; no wheezing or rales Heart: PMI is not displaced; RRR, s1 s2 normal; 4-1/3 systolic murmur. No S3 gallop. No diastolic murmur. No rubs thrills or heaves. Abdomen: soft, nontender; no hepatosplenomehaly, BS+; abdominal aorta nontender and not dilated by palpation. Back: No CVA tenderness Pulses 2+ Extremities:  Trivial ankle edema no clubbing cyanosis. Homan's sign negative  Neurologic: grossly nonfocal Psychologic: normal affect and mood.  ECG (independently read by me): Sinus bradycardia 56 bpm.  Poor anterior R-wave progression.  Normal intervals.  August 2015 ECG (independently read by me): Normal sinus rhythm.  Poor progression V1 through V3, concordant with anteroseptal infarct.  PR interval 208 ms.  QT interval 456 ms.  05/01/2013 ECG (independently read by me): Normal sinus rhythm at 66 beats per minute. Nonspecific ST-T changes. Normal intervals.  Prior 12/24/2012 ECG: Sinus rhythm at  67 beats per minute. No ectopy. QTc interval 431 ms  LABS:  BMET  BMP Latest Ref Rng 01/21/2013 06/02/2012 05/31/2012  Glucose 70 - 99 mg/dL 190(H) 177(H) 103(H)  BUN 6 - 23 mg/dL 11 11 19   Creatinine 0.50 - 1.10 mg/dL 0.93 0.76 0.89  Sodium 135 - 145 mEq/L 141 140 141  Potassium 3.5 - 5.3 mEq/L 4.3 3.6 2.8(L)  Chloride 96 - 112 mEq/L 101 103 102  CO2 19 - 32 mEq/L 33(H) 32 30  Calcium 8.4 - 10.5 mg/dL 9.3 8.4 8.7     Hepatic Function Panel   Hepatic Function Latest Ref Rng 01/21/2013 05/28/2012 05/28/2012  Total Protein 6.0 - 8.3 g/dL 5.7(L) 5.8(L) 6.5  Albumin 3.5 - 5.2 g/dL 3.5 2.8(L) 3.2(L)  AST 0 - 37 U/L 15 30 26   ALT 0 - 35 U/L 13 14 14   Alk Phosphatase 39 - 117 U/L 49 73 80  Total Bilirubin 0.3 - 1.2 mg/dL 1.0 1.5(H) 1.6(H)     CBC  CBC Latest Ref Rng 01/21/2013 05/31/2012 05/30/2012  WBC 4.0 - 10.5 K/uL 4.9 6.6 9.1  Hemoglobin 12.0 - 15.0 g/dL 11.7(L) 9.7(L) 11.1(L)  Hematocrit 36.0 - 46.0 % 33.6(L) 27.7(L) 31.5(L)  Platelets 150 - 400 K/uL 155 237 228     BNP    Component Value Date/Time   PROBNP 2859.0* 05/28/2012 1855    Lipid Panel     Component Value Date/Time   CHOL 138 01/21/2013 1006   TRIG 141 01/21/2013 1006   HDL 49 01/21/2013 1006   CHOLHDL 2.8 01/21/2013 1006   VLDL 28 01/21/2013 1006   LDLCALC 61 01/21/2013 1006     RADIOLOGY: No results found.    ASSESSMENT AND PLAN:   Ms. Edling is 81 year old female who has a documented subtotal/total very calcified LAD occlusion. She has been on medical therapy and has derived benefit with the addition of ranexa added to her medical regimen.  She is very frail and weighs only 79 pounds and is 4 feet 8 inches tall.  She has experienced some mild episodes of chest pressure.  Oftentimes these are precipitated when she lifts her hands above her head.  She also has cervical disc disease.  Presently, I am recommending slight titration of her Ranexa, such that she will take 1000 mg in the morning but  continue with the 500 mg at bedtime.  Her blood pressure is elevated today and I will slightly titrate her lisinopril from 5 mg daily to 7.5  mg.  She will continue to take her isosorbide mononitrate at 60 mg in the morning and 30 mg at night.  She is on atorvastatin 20 mg for lipid lowering with target LDL less than 70.  She also has type 2 diabetes mellitus on Coumadin per might.  She does have trivial ankle edema today and is currently tolerating torsemide 20 mg daily alternating with 20 mg twice a day every other day.  I am recommending a complete set of laboratory be obtained on her current therapy.  Adjustments will be made if necessary trek medical regimen.  I will see her in 3 months for reevaluation or sooner from arise.    Troy Sine, MD, [facility]  04/20/2014 12:38 PM

## 2014-04-27 ENCOUNTER — Telehealth: Payer: Self-pay | Admitting: Cardiovascular Disease

## 2014-04-27 NOTE — Telephone Encounter (Signed)
OK TO TRY BUT PHARMOKINETICALLY IT IS A BID DRUG

## 2014-04-27 NOTE — Telephone Encounter (Signed)
Pt has been feeling more fatigued since starting the increased dose of Ranexa (1000mg  AM, 500mg  PM).  Son called wanted to see if it would be OK to split this out to 500mg  TID.  Informed this should be OK but would defer to Dr. Tresa EndoKelly to advise.

## 2014-04-27 NOTE — Telephone Encounter (Signed)
Julia [name] called in wanting to know if his mother's dosage for her Ranexa could be switched to 3 500mg  tablets( one taken in the morning, lunch, and dinner). Please f/u with the son  Thanks

## 2014-04-28 NOTE — Telephone Encounter (Signed)
Can this encounter be closed?

## 2014-04-28 NOTE — Telephone Encounter (Signed)
Returned call to patient's son Molly MaduroRobert. Dr.Kelly advised ok to try Ranexa 500 mg three times a day.Advised to call back if mother continues to feel weak.

## 2014-05-04 ENCOUNTER — Other Ambulatory Visit: Payer: Self-pay | Admitting: Cardiovascular Disease

## 2014-05-05 NOTE — Telephone Encounter (Signed)
Rx(s) sent to pharmacy electronically.  

## 2014-05-25 ENCOUNTER — Other Ambulatory Visit: Payer: Self-pay | Admitting: Cardiovascular Disease

## 2014-05-25 MED ORDER — RANOLAZINE ER 500 MG PO TB12: ORAL_TABLET | ORAL | Status: DC

## 2014-05-25 NOTE — Telephone Encounter (Signed)
Julia MaduroRobert is calling to see if we have any samples of Ranexa 500mg  ..Please call   Thanks

## 2014-05-25 NOTE — Telephone Encounter (Signed)
Medication samples have been provided to the patient.  Drug name: Ranxea 500 mg Qty: 14 tabs LOT: [id number] Exp.Date: 02/2017 Drug name: Ranexa 500 mg Qty: 70 tabs LOT: [id number] Exp.Date: 06/2017  Samples left at front desk for patient pick-up. Patient notified.  Tandre Conly, Chelley 5:30 PM 05/25/2014

## 2014-06-09 ENCOUNTER — Encounter: Payer: Self-pay | Admitting: Cardiovascular Disease

## 2014-06-09 ENCOUNTER — Telehealth: Payer: Self-pay | Admitting: Cardiovascular Disease

## 2014-06-09 NOTE — Telephone Encounter (Signed)
Closed encounter °

## 2014-06-15 ENCOUNTER — Other Ambulatory Visit: Payer: Self-pay | Admitting: Cardiovascular Disease

## 2014-06-15 NOTE — Telephone Encounter (Signed)
Rx(s) sent to pharmacy electronically.  

## 2014-06-22 ENCOUNTER — Telehealth: Payer: Self-pay | Admitting: Cardiovascular Disease

## 2014-06-22 NOTE — Telephone Encounter (Signed)
Pt would like some samples of Ranexa please. °

## 2014-06-23 NOTE — Telephone Encounter (Signed)
Aware no samples available today 

## 2014-07-06 ENCOUNTER — Other Ambulatory Visit: Payer: Self-pay | Admitting: Cardiovascular Disease

## 2014-07-06 NOTE — Telephone Encounter (Signed)
Medication samples have been provided to the patient.  Drug name: ranexa 500 (patient takes 2 tabs QAM, 1 tab QHS)  Qty: 56  LOT: [id number]  Exp.Date: 06/2017  Samples left at front desk for patient pick-up. Patient notified.  Julaine Fusilkins, Jenna M 12:16 PM 07/06/2014

## 2014-07-06 NOTE — Telephone Encounter (Signed)
Pt would like some samples of Ranexa please. °

## 2014-07-21 ENCOUNTER — Emergency Department (HOSPITAL_COMMUNITY): Payer: Medicare Other

## 2014-07-21 ENCOUNTER — Emergency Department (HOSPITAL_COMMUNITY)
Admission: EM | Admit: 2014-07-21 | Discharge: 2014-07-22 | Disposition: A | Discharge status: Home / Self Care | Payer: Medicare Other | Attending: Emergency Medicine | Admitting: Emergency Medicine

## 2014-07-21 ENCOUNTER — Encounter (HOSPITAL_COMMUNITY): Payer: Self-pay | Admitting: Emergency Medicine

## 2014-07-21 DIAGNOSIS — K219 Gastro-esophageal reflux disease without esophagitis: Secondary | ICD-10-CM | POA: Diagnosis not present

## 2014-07-21 DIAGNOSIS — Z794 Long term (current) use of insulin: Secondary | ICD-10-CM | POA: Insufficient documentation

## 2014-07-21 DIAGNOSIS — K5902 Outlet dysfunction constipation: Secondary | ICD-10-CM | POA: Diagnosis not present

## 2014-07-21 DIAGNOSIS — E119 Type 2 diabetes mellitus without complications: Secondary | ICD-10-CM | POA: Diagnosis not present

## 2014-07-21 DIAGNOSIS — E785 Hyperlipidemia, unspecified: Secondary | ICD-10-CM | POA: Insufficient documentation

## 2014-07-21 DIAGNOSIS — I2510 Atherosclerotic heart disease of native coronary artery without angina pectoris: Secondary | ICD-10-CM | POA: Insufficient documentation

## 2014-07-21 DIAGNOSIS — Z7982 Long term (current) use of aspirin: Secondary | ICD-10-CM | POA: Insufficient documentation

## 2014-07-21 DIAGNOSIS — N182 Chronic kidney disease, stage 2 (mild): Secondary | ICD-10-CM | POA: Diagnosis not present

## 2014-07-21 DIAGNOSIS — M1990 Unspecified osteoarthritis, unspecified site: Secondary | ICD-10-CM | POA: Diagnosis not present

## 2014-07-21 DIAGNOSIS — Z8673 Personal history of transient ischemic attack (TIA), and cerebral infarction without residual deficits: Secondary | ICD-10-CM | POA: Diagnosis not present

## 2014-07-21 DIAGNOSIS — Z9889 Other specified postprocedural states: Secondary | ICD-10-CM | POA: Diagnosis not present

## 2014-07-21 DIAGNOSIS — K5900 Constipation, unspecified: Secondary | ICD-10-CM | POA: Diagnosis present

## 2014-07-21 DIAGNOSIS — Z79899 Other long term (current) drug therapy: Secondary | ICD-10-CM | POA: Insufficient documentation

## 2014-07-21 DIAGNOSIS — I129 Hypertensive chronic kidney disease with stage 1 through stage 4 chronic kidney disease, or unspecified chronic kidney disease: Secondary | ICD-10-CM | POA: Insufficient documentation

## 2014-07-21 LAB — CBC WITH DIFFERENTIAL/PLATELET
BASOS ABS: 0 10*3/uL (ref 0.0–0.1)
BASOS PCT: 0 % (ref 0–1)
EOS ABS: 0.1 10*3/uL (ref 0.0–0.7)
EOS PCT: 1 % (ref 0–5)
HEMATOCRIT: 35.4 % — AB (ref 36.0–46.0)
Hemoglobin: 12.2 g/dL (ref 12.0–15.0)
LYMPHS PCT: 22 % (ref 12–46)
Lymphs Abs: 1.3 10*3/uL (ref 0.7–4.0)
MCH: 30.3 pg (ref 26.0–34.0)
MCHC: 34.5 g/dL (ref 30.0–36.0)
MCV: 87.8 fL (ref 78.0–100.0)
MONOS PCT: 8 % (ref 3–12)
Monocytes Absolute: 0.5 10*3/uL (ref 0.1–1.0)
NEUTROS ABS: 4.1 10*3/uL (ref 1.7–7.7)
Neutrophils Relative %: 69 % (ref 43–77)
PLATELETS: 238 10*3/uL (ref 150–400)
RBC: 4.03 MIL/uL (ref 3.87–5.11)
RDW: 12.3 % (ref 11.5–15.5)
WBC: 5.9 10*3/uL (ref 4.0–10.5)

## 2014-07-21 LAB — COMPREHENSIVE METABOLIC PANEL
ALBUMIN: 3.2 g/dL — AB (ref 3.5–5.0)
ALT: 17 U/L (ref 14–54)
AST: 20 U/L (ref 15–41)
Alkaline Phosphatase: 49 U/L (ref 38–126)
Anion gap: 7 (ref 5–15)
BILIRUBIN TOTAL: 0.6 mg/dL (ref 0.3–1.2)
BUN: 14 mg/dL (ref 6–20)
CALCIUM: 9.1 mg/dL (ref 8.9–10.3)
CHLORIDE: 97 mmol/L — AB (ref 101–111)
CO2: 31 mmol/L (ref 22–32)
CREATININE: 1.16 mg/dL — AB (ref 0.44–1.00)
GFR, EST AFRICAN AMERICAN: 49 mL/min — AB (ref >60)
GFR, EST NON AFRICAN AMERICAN: 42 mL/min — AB (ref >60)
GLUCOSE: 289 mg/dL — AB (ref 65–99)
POTASSIUM: 3.8 mmol/L (ref 3.5–5.1)
Sodium: 135 mmol/L (ref 135–145)
TOTAL PROTEIN: 5.6 g/dL — AB (ref 6.5–8.1)

## 2014-07-21 LAB — URINALYSIS, ROUTINE W REFLEX MICROSCOPIC
BILIRUBIN URINE: NEGATIVE
GLUCOSE, UA: 250 mg/dL — AB
HGB URINE DIPSTICK: NEGATIVE
Ketones, ur: NEGATIVE mg/dL
Leukocytes, UA: NEGATIVE
Nitrite: NEGATIVE
PH: 5.5 (ref 5.0–8.0)
PROTEIN: NEGATIVE mg/dL
SPECIFIC GRAVITY, URINE: 1.014 (ref 1.005–1.030)
Urobilinogen, UA: 0.2 mg/dL (ref 0.0–1.0)

## 2014-07-21 LAB — I-STAT CG4 LACTIC ACID, ED: Lactic Acid, Venous: 0.98 mmol/L (ref 0.5–2.0)

## 2014-07-21 LAB — LIPASE, BLOOD: Lipase: 26 U/L (ref 22–51)

## 2014-07-21 MED ORDER — FENTANYL CITRATE (PF) 100 MCG/2ML IJ SOLN
50.0000 ug | Freq: Once | INTRAMUSCULAR | Status: AC
  Administered 2014-07-21: 50 ug via INTRAVENOUS
  Filled 2014-07-21: qty 2

## 2014-07-21 MED ORDER — POLYETHYLENE GLYCOL 3350 17 GM/SCOOP PO POWD: ORAL | Status: AC

## 2014-07-21 MED ORDER — FLEET PEDIATRIC 3.5-9.5 GM/59ML RE ENEM: 1.0000 | ENEMA | Freq: Every day | RECTAL | Status: DC

## 2014-07-21 MED ORDER — IOHEXOL 300 MG/ML  SOLN: 25.0000 mL | Freq: Once | INTRAMUSCULAR | Status: AC | PRN

## 2014-07-21 MED ORDER — IOHEXOL 300 MG/ML  SOLN
80.0000 mL | Freq: Once | INTRAMUSCULAR | Status: AC | PRN
  Administered 2014-07-21: 75 mL via INTRAVENOUS

## 2014-07-21 MED ORDER — GLYCERIN (LAXATIVE) 2 G RE SUPP: 1.0000 | Freq: Every day | RECTAL | Status: DC

## 2014-07-21 MED ORDER — SODIUM CHLORIDE 0.9 % IV BOLUS (SEPSIS): 1000.0000 mL | Freq: Once | INTRAVENOUS | Status: DC

## 2014-07-21 NOTE — ED Notes (Signed)
Per ems-- pt reports abdominal pain and distention x 4-5 day without a BM. Pt with hx of having 37inches of her colon removed. Pt has been taking pain medications and suppositories to help but has had no relief. Pt also c/o pain and pressure to rectum.

## 2014-07-21 NOTE — Discharge Instructions (Signed)

## 2014-07-21 NOTE — ED Provider Notes (Signed)
CSN: [id number]     Arrival date & time 07/21/14  1931 History   First MD Initiated Contact with Patient 07/21/14 1940     Chief Complaint  Patient presents with  . Constipation     (Consider location/radiation/quality/duration/timing/severity/associated sxs/prior Treatment) Patient is a 81 y.o. female presenting with constipation. The history is provided by the patient.  Constipation Severity:  Moderate Time since last bowel movement:  4 days Timing:  Constant Progression:  Unchanged Chronicity:  New Context comment:  Coinstipation Stool description:  None produced Relieved by:  Nothing Worsened by:  Nothing tried Ineffective treatments:  None tried Associated symptoms: abdominal pain (LLQ)   Associated symptoms: no diarrhea and no fever   Risk factors: hx of abdominal surgery     Past Medical History  Diagnosis Date  . Hypertension   . GERD (gastroesophageal reflux disease)   . Edema leg 12/06/2011  . Hyperlipidemia 12/06/2011  . CAD (coronary artery disease), severe single vessel LAD disease, nl. EF 12/08/2011    Unsuccessful PCI  . Shortness of breath     "all the time since last Friday" (12/12/2011)  . Type II diabetes mellitus   . CKD (chronic kidney disease) stage 2, GFR 60-89 ml/min 12/06/2011  . Stroke     "several small strokes"; denies residual  (12/12/2011)  . Arthritis     "all my joints" (12/12/2011)  . Anorexia   . Failure to thrive   . Memory loss    Past Surgical History  Procedure Laterality Date  . Pseudoaneurysm repair  12/12/2011    "no surgery; did ultrasonic; right femoral compression"  . Appendectomy    . Cholecystectomy    . Colectomy      "took out 27 1/2 inches" (12/12/2011)  . Lumbar disc surgery      "2 discs removed, spacer put in" (12/12/2011)  . Carpal tunnel release      bilaterally  . Hammer toe surgery      right  . Abdominal hysterectomy    . Back surgery    . Coronary angioplasty with stent placement  12/08/2011     "failed"  . Cardiac catheterization  12/07/2011  . Left heart catheterization with coronary angiogram N/A 12/07/2011    Procedure: LEFT HEART CATHETERIZATION WITH CORONARY ANGIOGRAM;  Surgeon: Marykay Lex, MD;  Location: [facility] CATH LAB;  Service: Cardiovascular;  Laterality: N/A;  . Percutaneous coronary stent intervention (pci-s) Bilateral 12/08/2011    Procedure: PERCUTANEOUS CORONARY STENT INTERVENTION (PCI-S);  Surgeon: [name], MD;  Location: [facility] CATH LAB;  Service: Cardiovascular;  Laterality: Bilateral;   Family History  Problem Relation Age of Onset  . Leukemia Brother   . Cancer Brother     ?  . Diabetes      Both Maternal and Paternal sides   History  Substance Use Topics  . Smoking status: Never Smoker   . Smokeless tobacco: Never Used  . Alcohol Use: No   OB History    No data available     Review of Systems  Constitutional: Negative for fever.  Gastrointestinal: Positive for abdominal pain (LLQ) and constipation. Negative for diarrhea.  All other systems reviewed and are negative.     Allergies  Review of patient's allergies indicates no known allergies.  Home Medications   Prior to Admission medications   Medication Sig Start Date End Date Taking? Authorizing Provider  aspirin 81 MG chewable tablet Chew 81 mg by mouth daily.   Yes Historical Provider, MD  atorvastatin (LIPITOR) 20 MG tablet TAKE 1 TABLET BY MOUTH EVERY DAY AS DIRECTED 12/24/13  Yes [name], MD  glimepiride (AMARYL) 4 MG tablet Take 4 mg by mouth daily with breakfast.   Yes Historical Provider, MD  HYDROcodone-acetaminophen (NORCO/VICODIN) 5-325 MG per tablet Take 1 tablet by mouth every 4 (four) hours as needed for moderate pain.   Yes Historical Provider, MD  hydrocortisone (ANUSOL-HC) 25 MG suppository Place 25 mg rectally 2 (two) times daily. Take for 7 days only started on 07-18-14   Yes Historical Provider, MD  Insulin Detemir (LEVEMIR) 100 UNIT/ML Pen Inject 10 Units into  the skin daily as needed. Per sliding scale. Take as needed per son   Yes Historical Provider, MD  isosorbide mononitrate (IMDUR) 60 MG 24 hr tablet Take 30-60 mg by mouth daily. Take 60mg  in the morning then take 30mg  in the afternoon (split the 60mg  in half)   Yes Historical Provider, MD  levETIRAcetam (KEPPRA) 750 MG tablet Take 1 tablet (750 mg total) by mouth 2 (two) times daily. 06/03/12  Yes Geoffry Paradise, MD  lisinopril (PRINIVIL,ZESTRIL) 5 MG tablet Take 1.5 tablets daily Patient taking differently: Take 7.5 mg by mouth See admin instructions. Take whole tablet at lunch time then take half tablet at bedtime per son 04/20/14  Yes [name], MD  metoprolol tartrate (LOPRESSOR) 25 MG tablet TAKE 1 TABLET BY MOUTH TWICE DAILY 10/13/13  Yes [name], MD  Multiple Vitamin (MULTIVITAMIN) tablet Take 1 tablet by mouth daily.   Yes Historical Provider, MD  NITROSTAT 0.4 MG SL tablet DISSOLVE 1 TABLET UNDER THE TONGUE EVERY 5 MINUTES AS NEEDED FOR CHEST PAIN 11/11/13  Yes [name], MD  omeprazole (PRILOSEC) 20 MG capsule Take 20 mg by mouth daily as needed. For heartburn   Yes Historical Provider, MD  ranolazine (RANEXA) 500 MG 12 hr tablet Take 2 tablets in the morning and 1 tablet in the evening Patient taking differently: Take 500 mg by mouth 3 (three) times daily.  05/25/14  Yes [name], MD  torsemide (DEMADEX) 20 MG tablet TAKE 1 TABLET BY MOUTH EVERY MORNING ON ODD DAYS AND 1 TABLET TWICE DAILY FOR EVEN DAYS 05/05/14  Yes Mihai Croitoru, MD  traMADol (ULTRAM) 50 MG tablet Take 0.5 tablets by mouth every 4 (four) hours as needed for moderate pain.  02/27/14  Yes Historical Provider, MD  glycerin adult (GLYCERIN ADULT) 2 G SUPP Place 1 suppository rectally daily. 07/21/14   Lyndal Pulley, MD  isosorbide mononitrate (IMDUR) 60 MG 24 hr tablet TAKE 1 TABLET BY MOUTH EVERY MORNING AND TAKE 1/2 TABLET BY MOUTH EVERY EVENING Patient not taking: Reported on 07/21/2014 06/15/14   [name], MD  polyethylene glycol powder (GLYCOLAX/MIRALAX) powder TAKE 6 CAPFULS OF MIRALAX IN A 32 OUNCE GATORADE AND DRINK THE WHOLE BEVERAGE FOLLOWED BY 3 CAPFULS TWICE A DAY FOR THE NEXT WEEK AND FOLLOW UP WITH YOUR PRIMARY CARE PHYSICIAN. 07/21/14   Lyndal Pulley, MD  sodium phosphate Pediatric (FLEET) 3.5-9.5 GM/59ML enema Place 66 mLs (1 enema total) rectally daily. 07/21/14   Lyndal Pulley, MD   BP 161/61 mmHg  Pulse 59  Temp(Src) 98.4 F (36.9 C) (Oral)  Resp 14  SpO2 97% Physical Exam  Constitutional: She is oriented to person, place, and time. She appears well-developed and well-nourished. No distress.  HENT:  Head: Normocephalic.  Eyes: Conjunctivae are normal.  Neck: Neck supple. No tracheal deviation present.  Cardiovascular: Normal  rate and regular rhythm.   Pulmonary/Chest: Effort normal. No respiratory distress.  Abdominal: Soft. She exhibits no distension. There is tenderness in the left lower quadrant. There is guarding. There is no rebound, no CVA tenderness, no tenderness at McBurney's point and negative Murphy's sign.  Neurological: She is alert and oriented to person, place, and time. She has normal strength. GCS eye subscore is 4. GCS verbal subscore is 5. GCS motor subscore is 6.  Skin: Skin is warm and dry.  Psychiatric: She has a normal mood and affect.    ED Course  Procedures (including critical care time) Labs Review Labs Reviewed  CBC WITH DIFFERENTIAL/PLATELET - Abnormal; Notable for the following:    HCT 35.4 (*)    All other components within normal limits  COMPREHENSIVE METABOLIC PANEL - Abnormal; Notable for the following:    Chloride 97 (*)    Glucose, Bld 289 (*)    Creatinine, Ser 1.16 (*)    Total Protein 5.6 (*)    Albumin 3.2 (*)    GFR calc non Af Amer 42 (*)    GFR calc Af Amer 49 (*)    All other components within normal limits  URINALYSIS, ROUTINE W REFLEX MICROSCOPIC (NOT AT [facility]) - Abnormal; Notable for the following:    Color, Urine  AMBER (*)    Glucose, UA 250 (*)    All other components within normal limits  LIPASE, BLOOD  I-STAT CG4 LACTIC ACID, ED    Imaging Review Ct Abdomen Pelvis W Contrast  07/21/2014   CLINICAL DATA:  Abdominal pain and distention for 4-5 days.  EXAM: CT ABDOMEN AND PELVIS WITH CONTRAST  TECHNIQUE: Multidetector CT imaging of the abdomen and pelvis was performed using the standard protocol following bolus administration of intravenous contrast.  CONTRAST:  75mL OMNIPAQUE IOHEXOL 300 MG/ML  SOLN  COMPARISON:  05/31/2012  FINDINGS: Lower chest:  No significant abnormality  Hepatobiliary: There is prior cholecystectomy. There is mild dilatation of the intrahepatic and extrahepatic bile ducts, likely due to post cholecystectomy reservoir effect. There are otherwise unremarkable appearances of the liver.  Pancreas: Mild atrophy of the pancreatic parenchyma with slight dilatation of the pancreatic duct. No focal lesions are evident. No peripancreatic inflammation or fluid.  Spleen: Normal  Adrenals/Urinary Tract: The adrenals and kidneys are normal in appearance. There is no urinary calculus evident. There is no hydronephrosis or ureteral dilatation. Collecting systems and ureters appear unremarkable.  Stomach/Bowel: The stomach and small bowel are unremarkable. There is prior appendectomy. There is a large volume of stool throughout the colon. There is distension of the rectum with stool. There is caudal position of the distended rectum below the expected level of the pelvic floor musculature, consistent with a significant degree of pelvic floor weakness and rectal prolapse. There is moderate thickening and indistinctness of the soft tissue planes around the distal rectum suggesting a degree of inflammatory change but there is no evidence of abscess or drainable fluid collection.  Vascular/Lymphatic: The abdominal aorta is normal in caliber with moderate atherosclerotic calcifications.  Other: There is no ascites.  No focal inflammatory changes are evident. There is no adenopathy.  There is prior hysterectomy.  No adnexal abnormalities are evident.  Musculoskeletal: There is no significant musculoskeletal lesion. Moderately severe degenerative disc changes are present at L3-4 and at L5-S1.  IMPRESSION: 1. Rectal prolapse. Marked distention of the rectum with stool. Mild inflammatory changes of the perirectal tissues without abscess or drainable fluid collection. 2. Mild dilatation of  the intrahepatic and extrahepatic bile ducts, likely chronic and most likely due to post cholecystectomy reservoir effect. This should be viewed with greater suspicion if there is a cholestatic enzyme pattern. 3. Moderately severe degenerative lumbar disc changes.   Electronically Signed   By: Ellery Plunkaniel R Mitchell M.D.   On: 07/21/2014 22:54     EKG Interpretation None      MDM   Final diagnoses:  Constipation by outlet obstruction    81 year old female presents with severe left lower bowel pain and constipation over the last 4-5 days. She states that she had been given antibodies previously for similar symptoms and it resolved spontaneously through her primary care physician. She has had no fevers, has had no vomiting. She has recently started on narcotic pain medications in the last month. On exam she does have some left lower quadrant tenderness and guarding so CT scan of the abdomen and pelvis was ordered to evaluate for diverticulitis versus other emergent causes of abdominal pain in an elderly patient.  On CT scan the only contribute a finding to the patient's symptoms is a large impacted stool ball that is likely the cause of her symptoms.  The patient was manually disimpacted by digital exploration where a large stool ball is noted in the rectal vault and broken up and a smaller pieces, removed gradually and then followed by soapsuds enema to help loosen the stool. I discussed this with the patient and with her son whom with  she lives and we will proceed with a bowel clean out with glycerin suppositories, Fleet enemas, and MiraLAX. Plan to follow up with PCP as needed and return precautions discussed for worsening or new concerning symptoms.   Lyndal Pulleyaniel Jazma Pickel, MD 07/21/14 2356  Arby BarretteMarcy Pfeiffer, MD 07/23/14 1106

## 2014-07-21 NOTE — ED Notes (Signed)
Family at bedside. 

## 2014-07-27 ENCOUNTER — Ambulatory Visit: Payer: Medicare Other | Admitting: Cardiovascular Disease

## 2014-07-28 ENCOUNTER — Telehealth: Payer: Self-pay | Admitting: Cardiovascular Disease

## 2014-07-28 NOTE — Telephone Encounter (Signed)
Pt would like some samples of Ranexa please. °

## 2014-07-28 NOTE — Telephone Encounter (Signed)
Called pt. And left message to call me back

## 2014-08-07 ENCOUNTER — Ambulatory Visit (INDEPENDENT_AMBULATORY_CARE_PROVIDER_SITE_OTHER): Payer: Medicare Other | Admitting: Cardiovascular Disease

## 2014-08-07 ENCOUNTER — Encounter: Payer: Self-pay | Admitting: Cardiovascular Disease

## 2014-08-07 VITALS — BP 180/64 | HR 53 | Ht <= 58 in | Wt 76.0 lb

## 2014-08-07 DIAGNOSIS — R600 Localized edema: Secondary | ICD-10-CM

## 2014-08-07 DIAGNOSIS — I2510 Atherosclerotic heart disease of native coronary artery without angina pectoris: Secondary | ICD-10-CM | POA: Diagnosis not present

## 2014-08-07 DIAGNOSIS — I2583 Coronary atherosclerosis due to lipid rich plaque: Secondary | ICD-10-CM

## 2014-08-07 DIAGNOSIS — E785 Hyperlipidemia, unspecified: Secondary | ICD-10-CM | POA: Diagnosis not present

## 2014-08-07 DIAGNOSIS — I10 Essential (primary) hypertension: Secondary | ICD-10-CM | POA: Diagnosis not present

## 2014-08-07 MED ORDER — LISINOPRIL 10 MG PO TABS: 10.0000 mg | ORAL_TABLET | Freq: Every day | ORAL | Status: DC

## 2014-08-07 NOTE — Progress Notes (Signed)
Patient ID: Zakyria L. Peabody, female   DOB: [birth year], 81 y.o.   MRN: [id number]     HPI: Journei Thomassen. Herst is a 81 y.o. female presents for a 3 month cardiology evaluation.  Ms. Danner has established CAD with a subtotal/total LAD occlusion in a very calcified LAD system. Initial attempts at performing intervention across the total occlusion were unsuccessful. She has been on medical therapy and currently is receiving Ranexa 500 mg twice a day, isosorbide, mononitrate 60 mg in the morning and 30 mg in the evening, metoprolol, tartrate 25 mg  twice a day, and  lisinopril 5 mg daily.  Additional problems include hypertension, type 2 diabetes mellitus, multilevel cervical spondylosis,. She  developed bradycardia necessitating reduction of her beta blockade regimen.   Her last echo Doppler study in April 2014 showed hyperdynamic systolic function with an EF of 65-70%.  She had mild left ventricular hypertrophy.  There is aortic valve sclerosis without stenosis and moderate mitral regurgitation.  There was mild pulmonary hypertension with an estimated PA pressure of 41 mm.  I last saw her 3 months ago when she resented to the office with a chief complaint of experiencing occasional episodes of chest discomfort.  This seemed to be exacerbated when she lifts her arms above her head.  They may occur approximately 1 to at most 2 times per week and are short-lived.  When she has taken nitroglycerin they have relieved with one sublingual nitroglycerin tablet.  At that time, I recommended Slate titration of Ranexa to 1000 g in the morning and 500 mg at night.  Her blood pressure was elevated and I recommended slight titration of her lisinopril from 5 mg to 7.5 mg.  She has been  isosorbide 60 mg in the morning and 30 mg at night.  She has continued to be on atorvastatin for lipid-lowering therapy.  She also is on torsemide for leg edema.  Her symptom complex including improve with the medical regimen change.   Presently, she denies any recent symptomatology.  She has seen Dr. Reynaldo Minium and apparently blood work was obtained by him recently.  Time, she has constipation and takes MiraLAX.  She presents for follow-up evaluation   Past Medical History  Diagnosis Date  . Hypertension   . GERD (gastroesophageal reflux disease)   . Edema leg 12/06/2011  . Hyperlipidemia 12/06/2011  . CAD (coronary artery disease), severe single vessel LAD disease, nl. EF 12/08/2011    Unsuccessful PCI  . Shortness of breath     "all the time since last Friday" (12/12/2011)  . Type II diabetes mellitus   . CKD (chronic kidney disease) stage 2, GFR 60-89 ml/min 12/06/2011  . Stroke     "several small strokes"; denies residual  (12/12/2011)  . Arthritis     "all my joints" (12/12/2011)  . Anorexia   . Failure to thrive   . Memory loss     Past Surgical History  Procedure Laterality Date  . Pseudoaneurysm repair  12/12/2011    "no surgery; did ultrasonic; right femoral compression"  . Appendectomy    . Cholecystectomy    . Colectomy      "took out 27 1/2 inches" (12/12/2011)  . Lumbar disc surgery      "2 discs removed, spacer put in" (12/12/2011)  . Carpal tunnel release      bilaterally  . Hammer toe surgery      right  . Abdominal hysterectomy    . Back surgery    .  Coronary angioplasty with stent placement  12/08/2011    "failed"  . Cardiac catheterization  12/07/2011  . Left heart catheterization with coronary angiogram N/A 12/07/2011    Procedure: LEFT HEART CATHETERIZATION WITH CORONARY ANGIOGRAM;  Surgeon: Leonie Man, MD;  Location: [facility] CATH LAB;  Service: Cardiovascular;  Laterality: N/A;  . Percutaneous coronary stent intervention (pci-s) Bilateral 12/08/2011    Procedure: PERCUTANEOUS CORONARY STENT INTERVENTION (PCI-S);  Surgeon: Troy Sine, MD;  Location: [facility] CATH LAB;  Service: Cardiovascular;  Laterality: Bilateral;    No Known Allergies  Current Outpatient Prescriptions    Medication Sig Dispense Refill  . aspirin 81 MG chewable tablet Chew 81 mg by mouth daily.    Marland Kitchen atorvastatin (LIPITOR) 20 MG tablet TAKE 1 TABLET BY MOUTH EVERY DAY AS DIRECTED 90 tablet 2  . glimepiride (AMARYL) 4 MG tablet Take 4 mg by mouth daily with breakfast.    . glycerin adult (GLYCERIN ADULT) 2 G SUPP Place 1 suppository rectally daily. 12 each 0  . HYDROcodone-acetaminophen (NORCO/VICODIN) 5-325 MG per tablet Take 1 tablet by mouth every 4 (four) hours as needed for moderate pain.    . hydrocortisone (ANUSOL-HC) 25 MG suppository Place 25 mg rectally 2 (two) times daily. Take for 7 days only started on 07-18-14    . Insulin Detemir (LEVEMIR) 100 UNIT/ML Pen Inject 10 Units into the skin daily as needed. Per sliding scale. Take as needed per son    . isosorbide mononitrate (IMDUR) 60 MG 24 hr tablet TAKE 1 TABLET BY MOUTH EVERY MORNING AND TAKE 1/2 TABLET BY MOUTH EVERY EVENING 45 tablet 9  . isosorbide mononitrate (IMDUR) 60 MG 24 hr tablet Take 30-60 mg by mouth daily. Take 8m in the morning then take 315min the afternoon (split the 6073mn half)    . levETIRAcetam (KEPPRA) 750 MG tablet Take 1 tablet (750 mg total) by mouth 2 (two) times daily. 60 tablet 11  . metoprolol tartrate (LOPRESSOR) 25 MG tablet TAKE 1 TABLET BY MOUTH TWICE DAILY 180 tablet 3  . Multiple Vitamin (MULTIVITAMIN) tablet Take 1 tablet by mouth daily.    . NMarland KitchenTROSTAT 0.4 MG SL tablet DISSOLVE 1 TABLET UNDER THE TONGUE EVERY 5 MINUTES AS NEEDED FOR CHEST PAIN 25 tablet 5  . omeprazole (PRILOSEC) 20 MG capsule Take 20 mg by mouth daily as needed. For heartburn    . polyethylene glycol powder (GLYCOLAX/MIRALAX) powder TAKE 6 CAPFULS OF MIRALAX IN A 32 OUNCE GATORADE AND DRINK THE WHOLE BEVERAGE FOLLOWED BY 3 CAPFULS TWICE A DAY FOR THE NEXT WEEK AND FOLLOW UP WITH YOUR PRIMARY CARE PHYSICIAN. 850 g 0  . ranolazine (RANEXA) 500 MG 12 hr tablet Take 2 tablets in the morning and 1 tablet in the evening (Patient taking  differently: Take 500 mg by mouth 3 (three) times daily. ) 84 tablet 0  . sodium phosphate Pediatric (FLEET) 3.5-9.5 GM/59ML enema Place 66 mLs (1 enema total) rectally daily. 200 mL 0  . torsemide (DEMADEX) 20 MG tablet TAKE 1 TABLET BY MOUTH EVERY MORNING ON ODD DAYS AND 1 TABLET TWICE DAILY FOR EVEN DAYS 135 tablet 1  . traMADol (ULTRAM) 50 MG tablet Take 0.5 tablets by mouth every 4 (four) hours as needed for moderate pain.   5  . lisinopril (PRINIVIL,ZESTRIL) 10 MG tablet Take 1 tablet (10 mg total) by mouth daily. 30 tablet 6   No current facility-administered medications for this visit.    History   Social History  .  Marital Status: Widowed    Spouse Name: N/A  . Number of Children: 3  . Years of Education: N/A   Occupational History  . Retired    Social History Main Topics  . Smoking status: Never Smoker   . Smokeless tobacco: Never Used  . Alcohol Use: No  . Drug Use: No  . Sexual Activity: No   Other Topics Concern  . Not on file   Social History Narrative   She is here today with her son.  Family History  Problem Relation Age of Onset  . Leukemia Brother   . Cancer Brother     ?  . Diabetes      Both Maternal and Paternal sides   ROS General: Negative; No fevers, chills, or night sweats;  HEENT: Negative; No changes in vision or hearing, sinus congestion, difficulty swallowing Pulmonary: Positive for shortness of breath with activity No cough, wheezing,  hemoptysis Cardiovascular: See history of present illness. GI: Occasional constipation GU: Negative; No dysuria, hematuria, or difficulty voiding Musculoskeletal: Negative; no myalgias, joint pain, or weakness Hematologic/Oncology: Negative; no easy bruising, bleeding Endocrine: Negative; no heat/cold intolerance; no diabetes Neuro: Negative; no changes in balance, headaches Skin: Negative; No rashes or skin lesions Psychiatric: Negative; No behavioral problems, depression Sleep: Negative; No snoring,  daytime sleepiness, hypersomnolence, bruxism, restless legs, hypnogognic hallucinations, no cataplexy Other comprehensive 14 point system review is negative.   PE BP 180/64 mmHg  Pulse 53  Ht 4' 8"  (1.422 m)  Wt 76 lb (34.473 kg)  BMI 17.05 kg/m2  Repeat blood pressure taken by me was 160/70  Wt Readings from Last 3 Encounters:  08/07/14 76 lb (34.473 kg)  04/20/14 79 lb 4.8 oz (35.97 kg)  10/08/13 77 lb 12.8 oz (35.29 kg)   General: Alert, oriented, no distress.  Skin: normal turgor, no rashes HEENT: Normocephalic, atraumatic. Pupils round and reactive; sclera anicteric;no lid lag.  Nose without nasal septal hypertrophy Mouth/Parynx benign; Mallinpatti scale 2 Neck: No JVD, no carotid bruits with normal carotid upstroke. Chest wall: Nontender to palpation Lungs: clear to ausculatation and percussion; no wheezing or rales Heart: PMI is not displaced; RRR, s1 s2 normal; 8-7/6 systolic murmur. No S3 gallop. No diastolic murmur. No rubs thrills or heaves. Abdomen: soft, nontender; no hepatosplenomehaly, BS+; abdominal aorta nontender and not dilated by palpation. Back: No CVA tenderness Pulses 2+ Extremities:  Bilateral trace to 1+ edema above the sock line ; no clubbing cyanosis. Homan's sign negative  Neurologic: grossly nonfocal Psychologic: normal affect and mood.  ECG (independently read by me): Sinus bradycardia at 53 bpm.  QRS complex V1 through V4.  March 2016 ECG (independently read by me): Sinus bradycardia 56 bpm.  Poor anterior R-wave progression.  Normal intervals.  August 2015 ECG (independently read by me): Normal sinus rhythm.  Poor progression V1 through V3, concordant with anteroseptal infarct.  PR interval 208 ms.  QT interval 456 ms.  05/01/2013 ECG (independently read by me): Normal sinus rhythm at 66 beats per minute. Nonspecific ST-T changes. Normal intervals.  Prior 12/24/2012 ECG: Sinus rhythm at 67 beats per minute. No ectopy. QTc interval 431  ms  LABS:  BMET  BMP Latest Ref Rng 07/21/2014 01/21/2013 06/02/2012  Glucose 65 - 99 mg/dL 289(H) 190(H) 177(H)  BUN 6 - 20 mg/dL 14 11 11   Creatinine 0.44 - 1.00 mg/dL 1.16(H) 0.93 0.76  Sodium 135 - 145 mmol/L 135 141 140  Potassium 3.5 - 5.1 mmol/L 3.8 4.3 3.6  Chloride 101 -  111 mmol/L 97(L) 101 103  CO2 22 - 32 mmol/L 31 33(H) 32  Calcium 8.9 - 10.3 mg/dL 9.1 9.3 8.4     Hepatic Function Panel   Hepatic Function Latest Ref Rng 07/21/2014 01/21/2013 05/28/2012  Total Protein 6.5 - 8.1 g/dL 5.6(L) 5.7(L) 5.8(L)  Albumin 3.5 - 5.0 g/dL 3.2(L) 3.5 2.8(L)  AST 15 - 41 U/L 20 15 30   ALT 14 - 54 U/L 17 13 14   Alk Phosphatase 38 - 126 U/L 49 49 73  Total Bilirubin 0.3 - 1.2 mg/dL 0.6 1.0 1.5(H)     CBC  CBC Latest Ref Rng 07/21/2014 01/21/2013 05/31/2012  WBC 4.0 - 10.5 K/uL 5.9 4.9 6.6  Hemoglobin 12.0 - 15.0 g/dL 12.2 11.7(L) 9.7(L)  Hematocrit 36.0 - 46.0 % 35.4(L) 33.6(L) 27.7(L)  Platelets 150 - 400 K/uL 238 155 237     BNP    Component Value Date/Time   PROBNP 2859.0* 05/28/2012 1855    Lipid Panel     Component Value Date/Time   CHOL 138 01/21/2013 1006   TRIG 141 01/21/2013 1006   HDL 49 01/21/2013 1006   CHOLHDL 2.8 01/21/2013 1006   VLDL 28 01/21/2013 1006   LDLCALC 61 01/21/2013 1006     RADIOLOGY: No results found.    ASSESSMENT AND PLAN:  Ms. Tacker is 81 year old female who has a documented subtotal/total very calcified LAD occlusion. She has been on medical therapy and has derived benefit with the addition of ranexa added to her medical regimen.  She is very frail and weighs only 76 pounds and is 4 feet 8 inches tall.  When I last saw her, she had experienced some vague episodes of chest pain.  These have improved with adjustment of her medical regimen.  Her blood pressure today is elevated despite taking isosorbide 60 g in the morning and 30 mg at night, lisinopril 7.5 mg and Lopressor 25 mg twice a day.  She also has been taking torsemide 20 mg  alternating with 40 mg.  I have further titrating her lisinopril to 10 mg. I will try to obtain the blood work from Dr. Jacquiline Doe office.  In 2 weeks she will have a repeat Bmet to make certain renal function remains stable.  She's not having any anginal symptoms.  She is tolerating her current dose of Ranexa.  QTc interval is normal.  A 407 ms.  There are no signs of CHF.  Target LDL is less than 70 and she continues to be on atorvastatin without myalgias.  I will see her in 6 months for reevaluation or sooner if necessary    Troy Sine, MD, [facility]  08/07/2014 7:03 PM

## 2014-08-07 NOTE — Patient Instructions (Signed)
Your physician has recommended you make the following change in your medication: the Lisinopril has been increased to 10 mg. A new prescription has been sent to your pharmacy.  Your physician recommends that you return for lab work in: 2 weeks.  Your physician wants you to follow-up in: 6 months or sooner if needed. You will receive a reminder letter in the mail two months in advance. If you don't receive a letter, please call our office to schedule the follow-up appointment.

## 2014-08-11 ENCOUNTER — Encounter: Payer: Self-pay | Admitting: Cardiovascular Disease

## 2014-09-07 ENCOUNTER — Other Ambulatory Visit: Payer: Self-pay | Admitting: Cardiovascular Disease

## 2014-09-07 NOTE — Telephone Encounter (Signed)
Patient calling the office for samples of medication:   1.  What medication and dosage are you requesting samples for? Ranexa  2.  Are you currently out of this medication? 1 left  3. Are you requesting samples to get you through until a mail order prescription arrives?no

## 2014-09-07 NOTE — Telephone Encounter (Signed)
Medication samples have been provided to the patient.  Drug name: ranexa  (takes 1 tab PO TID)  Qty: 84  LOT: [id number]  Exp.Date: 09/2017  Samples left at front desk for patient pick-up. Patient notified.  Julaine Fusi M 12:15 PM 09/07/2014

## 2014-09-14 ENCOUNTER — Telehealth: Payer: Self-pay | Admitting: Cardiovascular Disease

## 2014-09-14 NOTE — Telephone Encounter (Signed)
acknowledged

## 2014-09-14 NOTE — Telephone Encounter (Signed)
°  1. Which medications need to be refilled? Lisinopril   2. Which pharmacy is medication to be sent to?[facility] on [address]  3. Do they need a 30 day or 90 day supply? 30  4. Would they like a call back once the medication has been sent to the pharmacy? Yes she is completely out

## 2014-09-14 NOTE — Telephone Encounter (Signed)
Lisinopril was refilled in June for #30 with 6 refills. Spoke with pharmacy staff and she state that patient last filled me don 7/16 thus is too early, but they do have updated Rx on file.   Spoke with son and he reports he was under the impression patient was to take lisinopril  BID vs daily and she has been taking  daily instead of , which is why Rx has not lasted for a month. Son reports his mother does not monitor BP at home and only has it checked at doctor's appointments or if he can get her to the pharmacy to check with their machine.   Informed son he may have to call pharmacy and have the Rx not urn thru insurance in order to pick up Rx for his mother.   Routed to MD as Lorain Childes

## 2014-09-23 ENCOUNTER — Other Ambulatory Visit: Payer: Self-pay | Admitting: Cardiovascular Disease

## 2014-09-24 ENCOUNTER — Telehealth: Payer: Self-pay | Admitting: Cardiovascular Disease

## 2014-09-24 NOTE — Telephone Encounter (Signed)
Closed encounter .Marland Kitchen Schedule patient an appt

## 2014-09-24 NOTE — Telephone Encounter (Signed)
Rx(s) sent to pharmacy electronically.  

## 2014-09-28 ENCOUNTER — Ambulatory Visit (INDEPENDENT_AMBULATORY_CARE_PROVIDER_SITE_OTHER): Payer: Medicare Other | Admitting: Cardiovascular Disease

## 2014-09-28 VITALS — BP 102/78 | HR 62 | Ht <= 58 in | Wt 74.1 lb

## 2014-09-28 DIAGNOSIS — Z79899 Other long term (current) drug therapy: Secondary | ICD-10-CM | POA: Diagnosis not present

## 2014-09-28 DIAGNOSIS — N183 Chronic kidney disease, stage 3 unspecified: Secondary | ICD-10-CM

## 2014-09-28 DIAGNOSIS — R0609 Other forms of dyspnea: Secondary | ICD-10-CM

## 2014-09-28 DIAGNOSIS — I209 Angina pectoris, unspecified: Secondary | ICD-10-CM

## 2014-09-28 DIAGNOSIS — R600 Localized edema: Secondary | ICD-10-CM

## 2014-09-28 DIAGNOSIS — R0602 Shortness of breath: Secondary | ICD-10-CM

## 2014-09-28 DIAGNOSIS — I10 Essential (primary) hypertension: Secondary | ICD-10-CM

## 2014-09-28 DIAGNOSIS — E785 Hyperlipidemia, unspecified: Secondary | ICD-10-CM

## 2014-09-28 MED ORDER — TORSEMIDE 20 MG PO TABS: 20.0000 mg | ORAL_TABLET | Freq: Two times a day (BID) | ORAL | Status: DC

## 2014-09-28 NOTE — Patient Instructions (Signed)
Your physician recommends that you return for lab work .  Your physician has recommended you make the following change in your medication: the torsemide has been changed to 1 tablet twice a day.  Your physician recommends that you schedule a follow-up appointment in: jan-feb 2017.

## 2014-09-29 ENCOUNTER — Encounter: Payer: Self-pay | Admitting: Cardiovascular Disease

## 2014-09-29 NOTE — Progress Notes (Signed)
Patient ID: Fancy L. Defalco, female   DOB: 1927/06/06, 81 y.o.   MRN: [id number]    HPI: Ramona Ruark. Minton is a 81 y.o. female presents for a 2 month cardiology evaluation.  Ms. Sloop has established CAD with a subtotal/total LAD occlusion in a very calcified LAD system. Initial attempts at performing intervention across the total occlusion were unsuccessful. She has been on medical therapy and currently is receiving Ranexa 500 mg twice a day, isosorbide, mononitrate 60 mg in the morning and 30 mg in the evening, metoprolol, tartrate 25 mg  twice a day, and  lisinopril 5 mg daily.  Additional problems include hypertension, type 2 diabetes mellitus, multilevel cervical spondylosis,. She  developed bradycardia necessitating reduction of her beta blockade regimen.   An echo Doppler study in April 2014 showed hyperdynamic systolic function with an EF of 65-70%.  She had mild left ventricular hypertrophy.  There is aortic valve sclerosis without stenosis and moderate mitral regurgitation.  There was mild pulmonary hypertension with an estimated PA pressure of 41 mm.  Earlier this year she was seen in the office with a chief complaint of experiencing occasional episodes of chest discomfort.  This seemed to be exacerbated when she lifts her arms above her head.  They may occur approximately 1 to at most 2 times per week and are short-lived.  When she has taken nitroglycerin they have relieved with one sublingual nitroglycerin tablet.  At that time, I recommended  titration of Ranexa to 1000 mg in the morning and 500 mg at night.  Her blood pressure was elevated and I recommended slight titration of her lisinopril from 5 mg to 7.5 mg.  She has been  isosorbide 60 mg in the morning and 30 mg at night.  She has continued to be on atorvastatin for lipid-lowering therapy.  She also is on torsemide for leg edema.  She presents to the office today stating that she has not been feeling well.  She apparently was  hospitalized for impaction.  She has noticed some mild left leg swelling greater than right.  She has been more anxious.  She is tired and tearful.  She has had increased anxiety.  She presents for evaluation.  Past Medical History  Diagnosis Date  . Hypertension   . GERD (gastroesophageal reflux disease)   . Edema leg 12/06/2011  . Hyperlipidemia 12/06/2011  . CAD (coronary artery disease), severe single vessel LAD disease, nl. EF 12/08/2011    Unsuccessful PCI  . Shortness of breath     "all the time since last Friday" (12/12/2011)  . Type II diabetes mellitus   . CKD (chronic kidney disease) stage 2, GFR 60-89 ml/min 12/06/2011  . Stroke     "several small strokes"; denies residual  (12/12/2011)  . Arthritis     "all my joints" (12/12/2011)  . Anorexia   . Failure to thrive   . Memory loss     Past Surgical History  Procedure Laterality Date  . Pseudoaneurysm repair  12/12/2011    "no surgery; did ultrasonic; right femoral compression"  . Appendectomy    . Cholecystectomy    . Colectomy      "took out 27 1/2 inches" (12/12/2011)  . Lumbar disc surgery      "2 discs removed, spacer put in" (12/12/2011)  . Carpal tunnel release      bilaterally  . Hammer toe surgery      right  . Abdominal hysterectomy    . Back surgery    .  Coronary angioplasty with stent placement  12/08/2011    "failed"  . Cardiac catheterization  12/07/2011  . Left heart catheterization with coronary angiogram N/A 12/07/2011    Procedure: LEFT HEART CATHETERIZATION WITH CORONARY ANGIOGRAM;  Surgeon: Leonie Man, MD;  Location: [facility] CATH LAB;  Service: Cardiovascular;  Laterality: N/A;  . Percutaneous coronary stent intervention (pci-s) Bilateral 12/08/2011    Procedure: PERCUTANEOUS CORONARY STENT INTERVENTION (PCI-S);  Surgeon: Troy Sine, MD;  Location: [facility] CATH LAB;  Service: Cardiovascular;  Laterality: Bilateral;    No Known Allergies  Current Outpatient Prescriptions  Medication Sig  Dispense Refill  . aspirin 81 MG chewable tablet Chew 81 mg by mouth daily.    Marland Kitchen atorvastatin (LIPITOR) 20 MG tablet TAKE 1 TABLET BY MOUTH DAILY AS DIRECTED 90 tablet 2  . glimepiride (AMARYL) 4 MG tablet Take 4 mg by mouth daily with breakfast.    . isosorbide mononitrate (IMDUR) 60 MG 24 hr tablet TAKE 1 TABLET BY MOUTH EVERY MORNING AND TAKE 1/2 TABLET BY MOUTH EVERY EVENING 45 tablet 9  . levETIRAcetam (KEPPRA) 750 MG tablet Take 1 tablet (750 mg total) by mouth 2 (two) times daily. 60 tablet 11  . lisinopril (PRINIVIL,ZESTRIL) 10 MG tablet Take 1 tablet (10 mg total) by mouth daily. 30 tablet 6  . metoprolol tartrate (LOPRESSOR) 25 MG tablet TAKE 1 TABLET BY MOUTH TWICE DAILY 180 tablet 3  . Multiple Vitamin (MULTIVITAMIN) tablet Take 1 tablet by mouth daily.    Marland Kitchen NITROSTAT 0.4 MG SL tablet DISSOLVE 1 TABLET UNDER THE TONGUE EVERY 5 MINUTES AS NEEDED FOR CHEST PAIN 25 tablet 5  . omeprazole (PRILOSEC) 20 MG capsule Take 20 mg by mouth daily as needed. For heartburn    . polyethylene glycol powder (GLYCOLAX/MIRALAX) powder TAKE 6 CAPFULS OF MIRALAX IN A 32 OUNCE GATORADE AND DRINK THE WHOLE BEVERAGE FOLLOWED BY 3 CAPFULS TWICE A DAY FOR THE NEXT WEEK AND FOLLOW UP WITH YOUR PRIMARY CARE PHYSICIAN. 850 g 0  . ranolazine (RANEXA) 500 MG 12 hr tablet Take 500 mg by mouth 3 (three) times daily.    Marland Kitchen torsemide (DEMADEX) 20 MG tablet Take 1 tablet (20 mg total) by mouth 2 (two) times daily. 135 tablet 1  . traMADol (ULTRAM) 50 MG tablet Take 0.5 tablets by mouth every 4 (four) hours as needed for moderate pain.   5   No current facility-administered medications for this visit.    Social History   Social History  . Marital Status: Widowed    Spouse Name: N/A  . Number of Children: 3  . Years of Education: N/A   Occupational History  . Retired    Social History Main Topics  . Smoking status: Never Smoker   . Smokeless tobacco: Never Used  . Alcohol Use: No  . Drug Use: No  . Sexual  Activity: No   Other Topics Concern  . Not on file   Social History Narrative   She is here today with her son.  Family History  Problem Relation Age of Onset  . Leukemia Brother   . Cancer Brother     ?  . Diabetes      Both Maternal and Paternal sides   ROS General: Negative; No fevers, chills, or night sweats;  HEENT: Negative; No changes in vision or hearing, sinus congestion, difficulty swallowing Pulmonary: Positive for shortness of breath with activity No cough, wheezing,  hemoptysis Cardiovascular: See history of present illness. GI: Occasional constipation GU:  Negative; No dysuria, hematuria, or difficulty voiding Musculoskeletal: Negative; no myalgias, joint pain, or weakness Hematologic/Oncology: Negative; no easy bruising, bleeding Endocrine: Negative; no heat/cold intolerance; no diabetes Neuro: Negative; no changes in balance, headaches Skin: Negative; No rashes or skin lesions Psychiatric: Increased anxiety.  No seizures. Sleep: Negative; No snoring, daytime sleepiness, hypersomnolence, bruxism, restless legs, hypnogognic hallucinations, no cataplexy Other comprehensive 14 point system review is negative.   PE BP 102/78 mmHg  Pulse 62  Ht _0  (1.422 m)  Wt 74 lb 1.6 oz (33.612 kg)  BMI 16.62 kg/m2  SpO2 99%    Wt Readings from Last 3 Encounters:  09/28/14 74 lb 1.6 oz (33.612 kg)  08/07/14 76 lb (34.473 kg)  04/20/14 79 lb 4.8 oz (35.97 kg)   General: Alert, oriented, no distress; she is visibly very anxious, tearful and shaking. Skin: normal turgor, no rashes HEENT: Normocephalic, atraumatic. Pupils round and reactive; sclera anicteric;no lid lag.  Nose without nasal septal hypertrophy Mouth/Parynx benign; Mallinpatti scale 2 Neck: No JVD, no carotid bruits with normal carotid upstroke. Chest wall: Nontender to palpation Lungs: clear to ausculatation and percussion; no wheezing or rales Heart: PMI is not displaced; RRR, s1 s2 normal; 7-8/2  systolic murmur. No S3 gallop. No diastolic murmur. No rubs thrills or heaves. Abdomen: soft, nontender; no hepatosplenomehaly, BS+; abdominal aorta nontender and not dilated by palpation. Back: No CVA tenderness Pulses 2+ Extremities:  Bilateral trace to 1+ edema above , left greater than right; no clubbing cyanosis. Homan's sign negative  Neurologic: grossly nonfocal Psychologic: Appears anxious, more tearful.  ECG (independently read by me): Sinus rhythm at 62 bpm.  No cigarette ST segment changes.  Wandering baseline.  June 2016 ECG (independently read by me): Sinus bradycardia at 53 bpm.  QRS complex V1 through V4.  March 2016 ECG (independently read by me): Sinus bradycardia 56 bpm.  Poor anterior R-wave progression.  Normal intervals.  August 2015 ECG (independently read by me): Normal sinus rhythm.  Poor progression V1 through V3, concordant with anteroseptal infarct.  PR interval 208 ms.  QT interval 456 ms.  05/01/2013 ECG (independently read by me): Normal sinus rhythm at 66 beats per minute. Nonspecific ST-T changes. Normal intervals.  Prior 12/24/2012 ECG: Sinus rhythm at 67 beats per minute. No ectopy. QTc interval 431 ms  Due to concerns of her son regarding her oxygenation level, and oxygen saturation was obtained which was 99%.  LABS:  BMP Latest Ref Rng 07/21/2014 01/21/2013 06/02/2012  Glucose 65 - 99 mg/dL 289(H) 190(H) 177(H)  BUN 6 - 20 mg/dL _1 Creatinine 0.44 - 1.00 mg/dL 1.16(H) 0.93 0.76  Sodium 135 - 145 mmol/L 135 141 140  Potassium 3.5 - 5.1 mmol/L 3.8 4.3 3.6  Chloride 101 - 111 mmol/L 97(L) 101 103  CO2 22 - 32 mmol/L 31 33(H) 32  Calcium 8.9 - 10.3 mg/dL 9.1 9.3 8.4    Hepatic Function Latest Ref Rng 07/21/2014 01/21/2013 05/28/2012  Total Protein 6.5 - 8.1 g/dL 5.6(L) 5.7(L) 5.8(L)  Albumin 3.5 - 5.0 g/dL 3.2(L) 3.5 2.8(L)  AST 15 - 41 U/L _2 ALT 14 - 54 U/L _3 Alk Phosphatase 38 - 126 U/L 49 49 73  Total Bilirubin 0.3 - 1.2 mg/dL  0.6 1.0 1.5(H)    CBC Latest Ref Rng 07/21/2014 01/21/2013 05/31/2012  WBC 4.0 - 10.5 K/uL 5.9 4.9 6.6  Hemoglobin 12.0 - 15.0 g/dL 12.2 11.7(L) 9.7(L)  Hematocrit 36.0 - 46.0 %  35.4(L) 33.6(L) 27.7(L)  Platelets 150 - 400 K/uL 238 155 237   Lab Results  Component Value Date   MCV 87.8 07/21/2014   MCV 86.6 01/21/2013   MCV 86.6 05/31/2012   Lab Results  Component Value Date   TSH 2.652 01/21/2013   Lab Results  Component Value Date   HGBA1C 7.0* 12/06/2011    BNP    Component Value Date/Time   PROBNP 2859.0* 05/28/2012 1855    Lipid Panel     Component Value Date/Time   CHOL 138 01/21/2013 1006   TRIG 141 01/21/2013 1006   HDL 49 01/21/2013 1006   CHOLHDL 2.8 01/21/2013 1006   VLDL 28 01/21/2013 1006   LDLCALC 61 01/21/2013 1006     RADIOLOGY: No results found.    ASSESSMENT AND PLAN:  Ms. Brogdon is 82 year old female who has a documented subtotal/total very calcified LAD occlusion. She has been on medical therapy and has derived benefit with the addition of ranexa added to her medical regimen.  She is very frail and weighs only 76 pounds and is 4 feet 8 inches tall.  Previously she had experienced some vague episodes of chest pain.  These have improved with adjustment of her medical regimen.  Today she is much more fatigue, tearful, and anxious.  She recently was and packed it and required hospitalization for disimpaction.  She is concerned about some mild leg edema.  I have suggested she increase her torsemide to 20 mg twice a day from her present regimen.  I also suggested compression socks.  Her son had stated that they had put her on oxygen, which she was in the hospital.  Her oxygen saturation today is 99% on room air.  I discussed the possibility of a nocturnal oxygen saturations study but she was against this.  She is not having significant anginal symptomatology on her current medical regimen.  Her blood pressure today is controlled.  She has GERD for which he  takes omeprazole.  She is diabetic on Nepro ride.  She continues her lipid-lowering therapy.  She sees Dr. Reynaldo Minium for primary care who will be checking follow-up blood work.  She will be seeing him in October.  I will see her in January/February 2017.    Troy Sine, MD, [facility]  09/29/2014 5:59 PM

## 2014-10-11 ENCOUNTER — Other Ambulatory Visit: Payer: Self-pay | Admitting: Cardiovascular Disease

## 2014-10-15 LAB — COMPREHENSIVE METABOLIC PANEL
ALT: 16 U/L (ref 6–29)
AST: 17 U/L (ref 10–35)
Albumin: 3.4 g/dL — ABNORMAL LOW (ref 3.6–5.1)
Alkaline Phosphatase: 50 U/L (ref 33–130)
BILIRUBIN TOTAL: 0.8 mg/dL (ref 0.2–1.2)
BUN: 10 mg/dL (ref 7–25)
CO2: 34 mmol/L — AB (ref 20–31)
CREATININE: 0.9 mg/dL — AB (ref 0.60–0.88)
Calcium: 9.1 mg/dL (ref 8.6–10.4)
Chloride: 97 mmol/L — ABNORMAL LOW (ref 98–110)
Glucose, Bld: 295 mg/dL — ABNORMAL HIGH (ref 65–99)
Potassium: 4.3 mmol/L (ref 3.5–5.3)
Sodium: 137 mmol/L (ref 135–146)
Total Protein: 5.3 g/dL — ABNORMAL LOW (ref 6.1–8.1)

## 2014-10-15 LAB — CBC
HCT: 32.6 % — ABNORMAL LOW (ref 36.0–46.0)
Hemoglobin: 10.8 g/dL — ABNORMAL LOW (ref 12.0–15.0)
MCH: 30.3 pg (ref 26.0–34.0)
MCHC: 33.1 g/dL (ref 30.0–36.0)
MCV: 91.3 fL (ref 78.0–100.0)
MPV: 11.4 fL (ref 8.6–12.4)
Platelets: 160 10*3/uL (ref 150–400)
RBC: 3.57 MIL/uL — ABNORMAL LOW (ref 3.87–5.11)
RDW: 12.9 % (ref 11.5–15.5)
WBC: 5.3 10*3/uL (ref 4.0–10.5)

## 2014-10-15 LAB — TSH: TSH: 3.056 u[IU]/mL (ref 0.350–4.500)

## 2014-10-15 LAB — BRAIN NATRIURETIC PEPTIDE: Brain Natriuretic Peptide: 248 pg/mL — ABNORMAL HIGH (ref 0.0–100.0)

## 2014-11-02 ENCOUNTER — Telehealth: Payer: Self-pay | Admitting: Cardiovascular Disease

## 2014-11-02 NOTE — Telephone Encounter (Signed)
Pt's son called in stating that the pt came over to have some labs done about 3 weeks ago and they had not heard back about the results. Please f/u with the pt   Thanks

## 2014-11-02 NOTE — Telephone Encounter (Signed)
Spoke with patient's son and communicated lab results.

## 2014-11-04 ENCOUNTER — Other Ambulatory Visit: Payer: Self-pay | Admitting: Cardiovascular Disease

## 2014-11-05 NOTE — Telephone Encounter (Signed)
Rx request sent to pharmacy.  

## 2014-11-10 ENCOUNTER — Telehealth: Payer: Self-pay | Admitting: Cardiovascular Disease

## 2014-11-10 NOTE — Telephone Encounter (Signed)
Patient calling the office for samples of medication:   1.  What medication and dosage are you requesting samples for? Ranexa    2.  Are you currently out of this medication? Yes   3. Are you requesting samples to get you through until a mail order prescription arrives? No

## 2014-11-11 NOTE — Telephone Encounter (Signed)
Patient made aware no samples of ranexa are available. LM with son with same info.

## 2014-11-13 ENCOUNTER — Telehealth: Payer: Self-pay | Admitting: Cardiovascular Disease

## 2014-11-13 NOTE — Telephone Encounter (Signed)
Medication samples have been provided to the patient.  Drug name: ranexa 500  Qty: 70  LOT: [id number]  Exp.Date: 09/2017  Samples left at front desk for patient pick-up. Patient notified.  Julaine Fusi M 4:23 PM 11/13/2014

## 2014-11-13 NOTE — Telephone Encounter (Signed)
ranolazine (RANEXA) 500 MG 12 hr tablet  Needs samples

## 2014-11-17 ENCOUNTER — Telehealth: Payer: Self-pay | Admitting: Cardiovascular Disease

## 2014-11-17 DIAGNOSIS — R0602 Shortness of breath: Secondary | ICD-10-CM

## 2014-11-17 DIAGNOSIS — N182 Chronic kidney disease, stage 2 (mild): Secondary | ICD-10-CM

## 2014-11-17 DIAGNOSIS — I10 Essential (primary) hypertension: Secondary | ICD-10-CM

## 2014-11-17 NOTE — Telephone Encounter (Signed)
Returned call to patient no answer.LMTC. 

## 2014-11-17 NOTE — Telephone Encounter (Signed)
Julia [name] is calling because his mom is wanting to talk to a nurse about her shortness of breath and medication .Marland Kitchen Please call   Thanks

## 2014-11-17 NOTE — Telephone Encounter (Signed)
Returned call to patient.She stated she continues to be sob.Stated she is having swelling in lower legs,but is better today.Sob is worse.She complains of weakness and pain in right arm.She is taking medications as prescribed.Advised I will speak to [facility] this afternoon and call her back.

## 2014-11-17 NOTE — Telephone Encounter (Signed)
Returned call to patient.Dr.Kelly advised lab work tomorrow 10/5 bmet,bnp.Advised needs a echo and follow up with him.Schedulers will call back tomorrow to schedule.

## 2014-11-18 ENCOUNTER — Telehealth (HOSPITAL_COMMUNITY): Payer: Self-pay | Admitting: *Deleted

## 2014-11-18 LAB — BASIC METABOLIC PANEL
BUN: 16 mg/dL (ref 7–25)
CO2: 32 mmol/L — ABNORMAL HIGH (ref 20–31)
Calcium: 8.7 mg/dL (ref 8.6–10.4)
Chloride: 99 mmol/L (ref 98–110)
Creat: 1.2 mg/dL — ABNORMAL HIGH (ref 0.60–0.88)
Glucose, Bld: 304 mg/dL — ABNORMAL HIGH (ref 65–99)
Potassium: 4.2 mmol/L (ref 3.5–5.3)
Sodium: 138 mmol/L (ref 135–146)

## 2014-11-19 LAB — BRAIN NATRIURETIC PEPTIDE: Brain Natriuretic Peptide: 118 pg/mL — ABNORMAL HIGH (ref 0.0–100.0)

## 2014-11-26 ENCOUNTER — Other Ambulatory Visit: Payer: Self-pay | Admitting: Cardiovascular Disease

## 2014-11-27 NOTE — Telephone Encounter (Signed)
Rx(s) sent to pharmacy electronically.  

## 2014-11-30 ENCOUNTER — Other Ambulatory Visit: Payer: Self-pay

## 2014-11-30 ENCOUNTER — Ambulatory Visit (HOSPITAL_COMMUNITY): Payer: Medicare Other | Attending: Cardiovascular Disease

## 2014-11-30 DIAGNOSIS — R0602 Shortness of breath: Secondary | ICD-10-CM

## 2014-11-30 DIAGNOSIS — I340 Nonrheumatic mitral (valve) insufficiency: Secondary | ICD-10-CM | POA: Insufficient documentation

## 2014-11-30 DIAGNOSIS — R0600 Dyspnea, unspecified: Secondary | ICD-10-CM | POA: Diagnosis present

## 2014-11-30 DIAGNOSIS — I129 Hypertensive chronic kidney disease with stage 1 through stage 4 chronic kidney disease, or unspecified chronic kidney disease: Secondary | ICD-10-CM | POA: Insufficient documentation

## 2014-11-30 DIAGNOSIS — I10 Essential (primary) hypertension: Secondary | ICD-10-CM | POA: Diagnosis not present

## 2014-11-30 DIAGNOSIS — N189 Chronic kidney disease, unspecified: Secondary | ICD-10-CM | POA: Insufficient documentation

## 2014-11-30 DIAGNOSIS — N182 Chronic kidney disease, stage 2 (mild): Secondary | ICD-10-CM | POA: Diagnosis not present

## 2014-11-30 DIAGNOSIS — I517 Cardiomegaly: Secondary | ICD-10-CM | POA: Diagnosis not present

## 2014-12-01 ENCOUNTER — Telehealth: Payer: Self-pay | Admitting: Cardiovascular Disease

## 2014-12-01 NOTE — Telephone Encounter (Signed)
Returned call to patient's son.Ranexa 500 mg samples left at front desk for pick up.

## 2014-12-01 NOTE — Telephone Encounter (Signed)
Calling to get samples of Ranexa 500mg  also is wanting to know if the results from her echo back .Marland Kitchen. She had it done on yesterday .Marland Kitchen. Please call   Thanks

## 2014-12-30 ENCOUNTER — Telehealth: Payer: Self-pay | Admitting: Cardiovascular Disease

## 2014-12-30 NOTE — Telephone Encounter (Signed)
Patient calling the office for samples of medication: ° ° °1.  What medication and dosage are you requesting samples for? Ranexa 500mg ° °2.  Are you currently out of this medication? Yes ° ° ° ° °

## 2014-12-31 NOTE — Telephone Encounter (Signed)
Calling to see if we have any samples of Ranexa 500mg  . Called on yesterday .   Thanks

## 2014-12-31 NOTE — Telephone Encounter (Signed)
Patient aware samples are at the front desk for pick up  

## 2015-01-19 ENCOUNTER — Telehealth: Payer: Self-pay | Admitting: Cardiovascular Disease

## 2015-01-19 NOTE — Telephone Encounter (Signed)
Left message to call back  Samples her

## 2015-01-19 NOTE — Telephone Encounter (Signed)
Patient calling the office for samples of medication:   1.  What medication and dosage are you requesting samples for? Ranexa  2.  Are you currently out of this medication? No,enough until Friday

## 2015-01-20 NOTE — Telephone Encounter (Signed)
Samples at front  #5 lot #[id number] exp 01/2018 Aware ready to pick up

## 2015-02-16 ENCOUNTER — Other Ambulatory Visit: Payer: Self-pay | Admitting: Cardiovascular Disease

## 2015-02-16 MED ORDER — RANOLAZINE ER 500 MG PO TB12: 500.0000 mg | ORAL_TABLET | Freq: Three times a day (TID) | ORAL | Status: DC

## 2015-02-16 NOTE — Telephone Encounter (Signed)
Spoke with pt son, aware [facility] available [facility] [address] [facility]. Script called to the pharmacy.

## 2015-02-16 NOTE — Telephone Encounter (Signed)
Patient calling the office for samples of medication:   1.  What medication and dosage are you requesting samples for? Ranexa 500 mg 2.  Are you currently out of this medication?yes-need some please

## 2015-02-22 ENCOUNTER — Telehealth: Payer: Self-pay

## 2015-02-22 NOTE — Telephone Encounter (Signed)
Prior auth for Ranexa 500mg  TID sent to [facility].

## 2015-02-24 ENCOUNTER — Telehealth: Payer: Self-pay

## 2015-02-24 NOTE — Telephone Encounter (Signed)
Ranexa 500mg  approved through 02/13/2016. [id number].

## 2015-02-25 ENCOUNTER — Other Ambulatory Visit: Payer: Self-pay | Admitting: Cardiovascular Disease

## 2015-02-25 MED ORDER — RANOLAZINE ER 500 MG PO TB12: 500.0000 mg | ORAL_TABLET | Freq: Three times a day (TID) | ORAL | Status: AC

## 2015-02-25 NOTE — Telephone Encounter (Signed)
Spoke with patient she told me to call her son. Spoke with Molly Maduroobert informed him that samples his mother requested would be placed up front. Voiced understood.

## 2015-02-25 NOTE — Telephone Encounter (Signed)
Patient calling the office for samples of medication: ° ° °1.  What medication and dosage are you requesting samples for? Ranexa 500mg ° °2.  Are you currently out of this medication? Yes ° ° ° ° °

## 2015-03-05 ENCOUNTER — Emergency Department (HOSPITAL_COMMUNITY): Payer: Medicare Other

## 2015-03-05 ENCOUNTER — Encounter (HOSPITAL_COMMUNITY): Payer: Self-pay | Admitting: *Deleted

## 2015-03-05 ENCOUNTER — Observation Stay (HOSPITAL_COMMUNITY)
Admission: EM | Admit: 2015-03-05 | Discharge: 2015-03-07 | Disposition: A | Discharge status: Home / Self Care | Payer: Medicare Other | Attending: Family Medicine | Admitting: Family Medicine

## 2015-03-05 DIAGNOSIS — Z79899 Other long term (current) drug therapy: Secondary | ICD-10-CM | POA: Insufficient documentation

## 2015-03-05 DIAGNOSIS — E1165 Type 2 diabetes mellitus with hyperglycemia: Secondary | ICD-10-CM | POA: Diagnosis not present

## 2015-03-05 DIAGNOSIS — Z66 Do not resuscitate: Secondary | ICD-10-CM | POA: Diagnosis not present

## 2015-03-05 DIAGNOSIS — R55 Syncope and collapse: Secondary | ICD-10-CM | POA: Diagnosis not present

## 2015-03-05 DIAGNOSIS — M1990 Unspecified osteoarthritis, unspecified site: Secondary | ICD-10-CM | POA: Insufficient documentation

## 2015-03-05 DIAGNOSIS — R197 Diarrhea, unspecified: Secondary | ICD-10-CM | POA: Insufficient documentation

## 2015-03-05 DIAGNOSIS — R627 Adult failure to thrive: Secondary | ICD-10-CM | POA: Insufficient documentation

## 2015-03-05 DIAGNOSIS — E1122 Type 2 diabetes mellitus with diabetic chronic kidney disease: Secondary | ICD-10-CM | POA: Insufficient documentation

## 2015-03-05 DIAGNOSIS — W19XXXA Unspecified fall, initial encounter: Secondary | ICD-10-CM | POA: Insufficient documentation

## 2015-03-05 DIAGNOSIS — I10 Essential (primary) hypertension: Secondary | ICD-10-CM | POA: Diagnosis not present

## 2015-03-05 DIAGNOSIS — E785 Hyperlipidemia, unspecified: Secondary | ICD-10-CM | POA: Insufficient documentation

## 2015-03-05 DIAGNOSIS — Z7984 Long term (current) use of oral hypoglycemic drugs: Secondary | ICD-10-CM | POA: Insufficient documentation

## 2015-03-05 DIAGNOSIS — Z9181 History of falling: Secondary | ICD-10-CM | POA: Insufficient documentation

## 2015-03-05 DIAGNOSIS — I130 Hypertensive heart and chronic kidney disease with heart failure and stage 1 through stage 4 chronic kidney disease, or unspecified chronic kidney disease: Secondary | ICD-10-CM | POA: Insufficient documentation

## 2015-03-05 DIAGNOSIS — I509 Heart failure, unspecified: Secondary | ICD-10-CM | POA: Diagnosis not present

## 2015-03-05 DIAGNOSIS — N182 Chronic kidney disease, stage 2 (mild): Secondary | ICD-10-CM | POA: Diagnosis not present

## 2015-03-05 DIAGNOSIS — R0602 Shortness of breath: Secondary | ICD-10-CM | POA: Diagnosis present

## 2015-03-05 DIAGNOSIS — K219 Gastro-esophageal reflux disease without esophagitis: Secondary | ICD-10-CM | POA: Insufficient documentation

## 2015-03-05 DIAGNOSIS — Z7982 Long term (current) use of aspirin: Secondary | ICD-10-CM | POA: Diagnosis not present

## 2015-03-05 DIAGNOSIS — E876 Hypokalemia: Secondary | ICD-10-CM | POA: Insufficient documentation

## 2015-03-05 DIAGNOSIS — R079 Chest pain, unspecified: Secondary | ICD-10-CM | POA: Insufficient documentation

## 2015-03-05 DIAGNOSIS — Z955 Presence of coronary angioplasty implant and graft: Secondary | ICD-10-CM | POA: Insufficient documentation

## 2015-03-05 DIAGNOSIS — G40909 Epilepsy, unspecified, not intractable, without status epilepticus: Secondary | ICD-10-CM | POA: Insufficient documentation

## 2015-03-05 DIAGNOSIS — Z9049 Acquired absence of other specified parts of digestive tract: Secondary | ICD-10-CM | POA: Insufficient documentation

## 2015-03-05 DIAGNOSIS — I2510 Atherosclerotic heart disease of native coronary artery without angina pectoris: Secondary | ICD-10-CM | POA: Insufficient documentation

## 2015-03-05 DIAGNOSIS — Z8673 Personal history of transient ischemic attack (TIA), and cerebral infarction without residual deficits: Secondary | ICD-10-CM | POA: Diagnosis not present

## 2015-03-05 DIAGNOSIS — R001 Bradycardia, unspecified: Secondary | ICD-10-CM | POA: Diagnosis not present

## 2015-03-05 DIAGNOSIS — Y92009 Unspecified place in unspecified non-institutional (private) residence as the place of occurrence of the external cause: Secondary | ICD-10-CM | POA: Insufficient documentation

## 2015-03-05 HISTORY — DX: Heart failure, unspecified: I50.9

## 2015-03-05 HISTORY — DX: Acute myocardial infarction, unspecified: I21.9

## 2015-03-05 LAB — BASIC METABOLIC PANEL
Anion gap: 11 (ref 5–15)
BUN: 8 mg/dL (ref 6–20)
CHLORIDE: 96 mmol/L — AB (ref 101–111)
CO2: 32 mmol/L (ref 22–32)
CREATININE: 0.87 mg/dL (ref 0.44–1.00)
Calcium: 9.7 mg/dL (ref 8.9–10.3)
GFR calc non Af Amer: 59 mL/min — ABNORMAL LOW (ref >60)
Glucose, Bld: 325 mg/dL — ABNORMAL HIGH (ref 65–99)
POTASSIUM: 2.8 mmol/L — AB (ref 3.5–5.1)
SODIUM: 139 mmol/L (ref 135–145)

## 2015-03-05 LAB — URINALYSIS, ROUTINE W REFLEX MICROSCOPIC
Bilirubin Urine: NEGATIVE
HGB URINE DIPSTICK: NEGATIVE
Ketones, ur: NEGATIVE mg/dL
LEUKOCYTES UA: NEGATIVE
Nitrite: NEGATIVE
PH: 7.5 (ref 5.0–8.0)
Protein, ur: 100 mg/dL — AB
SPECIFIC GRAVITY, URINE: 1.014 (ref 1.005–1.030)

## 2015-03-05 LAB — URINE MICROSCOPIC-ADD ON: RBC / HPF: NONE SEEN RBC/hpf (ref 0–5)

## 2015-03-05 LAB — CBC
HEMATOCRIT: 33.4 % — AB (ref 36.0–46.0)
Hemoglobin: 11.7 g/dL — ABNORMAL LOW (ref 12.0–15.0)
MCH: 30.1 pg (ref 26.0–34.0)
MCHC: 35 g/dL (ref 30.0–36.0)
MCV: 85.9 fL (ref 78.0–100.0)
Platelets: 176 10*3/uL (ref 150–400)
RBC: 3.89 MIL/uL (ref 3.87–5.11)
RDW: 12.7 % (ref 11.5–15.5)
WBC: 4.9 10*3/uL (ref 4.0–10.5)

## 2015-03-05 LAB — I-STAT TROPONIN, ED: Troponin i, poc: 0.02 ng/mL (ref 0.00–0.08)

## 2015-03-05 LAB — GLUCOSE, CAPILLARY: Glucose-Capillary: 186 mg/dL — ABNORMAL HIGH (ref 65–99)

## 2015-03-05 MED ORDER — INSULIN ASPART 100 UNIT/ML ~~LOC~~ SOLN
0.0000 [IU] | Freq: Three times a day (TID) | SUBCUTANEOUS | Status: DC
  Administered 2015-03-06: 1 [IU] via SUBCUTANEOUS

## 2015-03-05 MED ORDER — ONDANSETRON HCL 4 MG/2ML IJ SOLN: 4.0000 mg | Freq: Four times a day (QID) | INTRAMUSCULAR | Status: DC | PRN

## 2015-03-05 MED ORDER — ALBUTEROL SULFATE (2.5 MG/3ML) 0.083% IN NEBU: 2.5000 mg | INHALATION_SOLUTION | RESPIRATORY_TRACT | Status: DC | PRN

## 2015-03-05 MED ORDER — RANOLAZINE ER 500 MG PO TB12
500.0000 mg | ORAL_TABLET | Freq: Three times a day (TID) | ORAL | Status: DC
  Administered 2015-03-06 – 2015-03-07 (×4): 500 mg via ORAL
  Filled 2015-03-05 (×5): qty 1

## 2015-03-05 MED ORDER — ADULT MULTIVITAMIN W/MINERALS CH
1.0000 | ORAL_TABLET | Freq: Every day | ORAL | Status: DC
  Administered 2015-03-06 – 2015-03-07 (×2): 1 via ORAL
  Filled 2015-03-05 (×2): qty 1

## 2015-03-05 MED ORDER — ASPIRIN EC 81 MG PO TBEC
81.0000 mg | DELAYED_RELEASE_TABLET | Freq: Every day | ORAL | Status: DC
  Administered 2015-03-06 – 2015-03-07 (×2): 81 mg via ORAL
  Filled 2015-03-05 (×2): qty 1

## 2015-03-05 MED ORDER — SODIUM CHLORIDE 0.9 % IV SOLN
INTRAVENOUS | Status: DC
  Administered 2015-03-05: 1000 mL via INTRAVENOUS

## 2015-03-05 MED ORDER — ISOSORBIDE MONONITRATE ER 60 MG PO TB24
90.0000 mg | ORAL_TABLET | Freq: Every day | ORAL | Status: DC
  Administered 2015-03-06 – 2015-03-07 (×2): 90 mg via ORAL
  Filled 2015-03-05 (×2): qty 1

## 2015-03-05 MED ORDER — ATORVASTATIN CALCIUM 20 MG PO TABS
20.0000 mg | ORAL_TABLET | Freq: Every day | ORAL | Status: DC
  Administered 2015-03-06 – 2015-03-07 (×2): 20 mg via ORAL
  Filled 2015-03-05 (×2): qty 1

## 2015-03-05 MED ORDER — METOPROLOL TARTRATE 25 MG PO TABS
25.0000 mg | ORAL_TABLET | Freq: Two times a day (BID) | ORAL | Status: DC
  Administered 2015-03-06 – 2015-03-07 (×2): 25 mg via ORAL
  Filled 2015-03-05 (×4): qty 1

## 2015-03-05 MED ORDER — LEVETIRACETAM 750 MG PO TABS
750.0000 mg | ORAL_TABLET | Freq: Two times a day (BID) | ORAL | Status: DC
  Administered 2015-03-06 – 2015-03-07 (×4): 750 mg via ORAL
  Filled 2015-03-05 (×4): qty 1

## 2015-03-05 MED ORDER — ONDANSETRON HCL 4 MG PO TABS: 4.0000 mg | ORAL_TABLET | Freq: Four times a day (QID) | ORAL | Status: DC | PRN

## 2015-03-05 MED ORDER — SODIUM CHLORIDE 0.9 % IJ SOLN
3.0000 mL | Freq: Two times a day (BID) | INTRAMUSCULAR | Status: DC
  Administered 2015-03-05: 3 mL via INTRAVENOUS

## 2015-03-05 MED ORDER — TRAMADOL HCL 50 MG PO TABS
25.0000 mg | ORAL_TABLET | ORAL | Status: DC | PRN
  Administered 2015-03-06: 50 mg via ORAL
  Administered 2015-03-06: 25 mg via ORAL
  Filled 2015-03-05 (×2): qty 1

## 2015-03-05 MED ORDER — LISINOPRIL 10 MG PO TABS
10.0000 mg | ORAL_TABLET | Freq: Every day | ORAL | Status: DC
  Administered 2015-03-06 (×2): 10 mg via ORAL
  Filled 2015-03-05 (×2): qty 1

## 2015-03-05 MED ORDER — ACETAMINOPHEN 325 MG PO TABS: 650.0000 mg | ORAL_TABLET | Freq: Four times a day (QID) | ORAL | Status: DC | PRN

## 2015-03-05 MED ORDER — ENOXAPARIN SODIUM 30 MG/0.3ML ~~LOC~~ SOLN
30.0000 mg | Freq: Every day | SUBCUTANEOUS | Status: DC
  Administered 2015-03-06: 30 mg via SUBCUTANEOUS
  Filled 2015-03-05: qty 0.3

## 2015-03-05 MED ORDER — POTASSIUM CHLORIDE 10 MEQ/100ML IV SOLN
10.0000 meq | INTRAVENOUS | Status: AC
  Administered 2015-03-05 – 2015-03-06 (×3): 10 meq via INTRAVENOUS
  Filled 2015-03-05 (×4): qty 100

## 2015-03-05 MED ORDER — DOCUSATE SODIUM 100 MG PO CAPS
100.0000 mg | ORAL_CAPSULE | Freq: Two times a day (BID) | ORAL | Status: DC
  Administered 2015-03-06: 100 mg via ORAL
  Filled 2015-03-05: qty 1

## 2015-03-05 MED ORDER — ACETAMINOPHEN 650 MG RE SUPP: 650.0000 mg | Freq: Four times a day (QID) | RECTAL | Status: DC | PRN

## 2015-03-05 MED ORDER — POTASSIUM CHLORIDE CRYS ER 20 MEQ PO TBCR
40.0000 meq | EXTENDED_RELEASE_TABLET | Freq: Once | ORAL | Status: AC
  Administered 2015-03-05: 40 meq via ORAL
  Filled 2015-03-05: qty 2

## 2015-03-05 MED ORDER — HYDRALAZINE HCL 20 MG/ML IJ SOLN
10.0000 mg | Freq: Once | INTRAMUSCULAR | Status: AC
  Administered 2015-03-05: 10 mg via INTRAVENOUS
  Filled 2015-03-05: qty 1

## 2015-03-05 NOTE — H&P (Addendum)
[facility] History and Physical  Julia [name] XIP:[id number] DOB: 03/15/1927 DOA: 03/05/2015   PCP: Geoffery Lyons, MD  Specialists: Dr. Claiborne Billings is her cardiologist  Chief Complaint: Fall with possible passing out episode  HPI: Julia [name] is a 82 y.o. female with a past medical history of coronary artery disease on medical management, diabetes mellitus type 2, seizure disorder, hypertension, who was brought in by her son after she sustained a fall earlier today at her house. She does not remember if she passed out or not. When the son found her, she was awake. She doesn't remember the circumstances of her fall. She needed help to get up. She continued to have a lot of pain all over her body. Subsequently, she was brought into the emergency department. She denies any chest pain, specifically. She does get short of breath with exertion, which is an ongoing chronic issue for her. She is very anxious. She hasn't taken any of her medications today and as a result, her blood pressure has been very high in the emergency department. She denies any nausea, vomiting. No discomfort with urination. Overall, not a very good historian. Evaluation in the emergency department revealed hypokalemia. She'll be observed overnight in the hospital.  Home Medications: Prior to Admission medications   Medication Sig Start Date End Date Taking? Authorizing Provider  aspirin EC 81 MG tablet Take 81 mg by mouth daily.   Yes Historical Provider, MD  atorvastatin (LIPITOR) 20 MG tablet TAKE 1 TABLET BY MOUTH DAILY AS DIRECTED 09/24/14  Yes Troy Sine, MD  glimepiride (AMARYL) 4 MG tablet Take 4 mg by mouth daily with breakfast.   Yes Historical Provider, MD  isosorbide mononitrate (IMDUR) 60 MG 24 hr tablet TAKE 1 TABLET BY MOUTH EVERY MORNING AND TAKE 1/2 TABLET BY MOUTH EVERY EVENING 06/15/14  Yes Troy Sine, MD  levETIRAcetam (KEPPRA) 750 MG tablet Take 1 tablet (750 mg total) by mouth 2 (two)  times daily. 06/03/12  Yes Burnard Bunting, MD  lisinopril (PRINIVIL,ZESTRIL) 10 MG tablet Take 1 tablet (10 mg total) by mouth daily. 08/07/14  Yes Troy Sine, MD  metoprolol tartrate (LOPRESSOR) 25 MG tablet TAKE 1 TABLET BY MOUTH TWICE DAILY 10/12/14  Yes Troy Sine, MD  Multiple Vitamin (MULTIVITAMIN WITH MINERALS) TABS tablet Take 1 tablet by mouth daily.   Yes Historical Provider, MD  NITROSTAT 0.4 MG SL tablet DISSOLVE 1 TABLET UNDER THE TONGUE EVERY 5 MINUTES AS NEEDED FOR CHEST FOR PAIN AS DIRECTED 11/27/14  Yes Troy Sine, MD  omeprazole (PRILOSEC) 20 MG capsule Take 20 mg by mouth daily as needed (heartburn).    Yes Historical Provider, MD  polyethylene glycol powder (GLYCOLAX/MIRALAX) powder TAKE 6 CAPFULS OF MIRALAX IN A 32 OUNCE GATORADE AND DRINK THE WHOLE BEVERAGE FOLLOWED BY 3 CAPFULS TWICE A DAY FOR THE NEXT WEEK AND FOLLOW UP WITH YOUR PRIMARY CARE PHYSICIAN. Patient taking differently: Take 0.5 Containers by mouth every other day.  07/21/14  Yes Leo Grosser, MD  ranolazine (RANEXA) 500 MG 12 hr tablet Take 1 tablet (500 mg total) by mouth 3 (three) times daily. 02/25/15  Yes Troy Sine, MD  torsemide (DEMADEX) 20 MG tablet TAKE 1 TABLET BY MOUTH EVERY MORNING ON ODD DAYS AND 1 TABLET TWICE DAILY ON EVEN DAYS Patient taking differently: TAKE 1 TABLET BY MOUTH TWICE DAILY. 11/05/14  Yes Troy Sine, MD  traMADol (ULTRAM) 50 MG tablet Take 25-50 tablets by mouth every 4 (four)  hours as needed for moderate pain.  02/27/14  Yes Historical Provider, MD    Allergies: No Known Allergies  Past Medical History: Past Medical History  Diagnosis Date  . Hypertension   . GERD (gastroesophageal reflux disease)   . Edema leg 12/06/2011  . Hyperlipidemia 12/06/2011  . CAD (coronary artery disease), severe single vessel LAD disease, nl. EF 12/08/2011    Unsuccessful PCI  . Shortness of breath     "all the time since last Friday" (12/12/2011)  . Type II diabetes mellitus ([place])    . CKD (chronic kidney disease) stage 2, GFR 60-89 ml/min 12/06/2011  . Stroke [facility])     "several small strokes"; denies residual  (12/12/2011)  . Arthritis     "all my joints" (12/12/2011)  . Anorexia   . Failure to thrive   . Memory loss     Past Surgical History  Procedure Laterality Date  . Pseudoaneurysm repair  12/12/2011    "no surgery; did ultrasonic; right femoral compression"  . Appendectomy    . Cholecystectomy    . Colectomy      "took out 27 1/2 inches" (12/12/2011)  . Lumbar disc surgery      "2 discs removed, spacer put in" (12/12/2011)  . Carpal tunnel release      bilaterally  . Hammer toe surgery      right  . Abdominal hysterectomy    . Back surgery    . Coronary angioplasty with stent placement  12/08/2011    "failed"  . Cardiac catheterization  12/07/2011  . Left heart catheterization with coronary angiogram N/A 12/07/2011    Procedure: LEFT HEART CATHETERIZATION WITH CORONARY ANGIOGRAM;  Surgeon: Leonie Man, MD;  Location: [facility] CATH LAB;  Service: Cardiovascular;  Laterality: N/A;  . Percutaneous coronary stent intervention (pci-s) Bilateral 12/08/2011    Procedure: PERCUTANEOUS CORONARY STENT INTERVENTION (PCI-S);  Surgeon: Troy Sine, MD;  Location: [facility] CATH LAB;  Service: Cardiovascular;  Laterality: Bilateral;    Social History: She lives with her son. No history of smoking but has been exposed to passive smoking. No alcohol use. No illicit drug use. Uses a walker to ambulate.  Family History:  Family History  Problem Relation Age of Onset  . Leukemia Brother   . Cancer Brother     ?  . Diabetes      Both Maternal and Paternal sides     Review of Systems - difficult to do in this patient with mental confusion  Physical Examination  Filed Vitals:   03/05/15 1927 03/05/15 2057 03/05/15 2100 03/05/15 2206  BP: 198/61 203/94 193/83 207/85  Pulse:  53 65 62  Temp:      Resp:  15 17 19   SpO2:  100% 100% 100%    BP 207/85 mmHg   Pulse 62  Temp(Src) 97.9 F (36.6 C)  Resp 19  SpO2 100%  General appearance: alert, cooperative, appears stated age and no distress Head: Normocephalic, without obvious abnormality, atraumatic Eyes: conjunctivae/corneas clear. PERRL, EOM's intact.  Throat: Slightly dry mucous membranes without any oral lesions. Neck: no adenopathy, no carotid bruit, no JVD, supple, symmetrical, trachea midline and thyroid not enlarged, symmetric, no tenderness/mass/nodules Resp: clear to auscultation bilaterally Cardio: regular rate and rhythm, S1, S2 normal, no murmur, click, rub or gallop GI: soft, non-tender; bowel sounds normal; no masses,  no organomegaly Extremities: extremities normal, atraumatic, no cyanosis or edema and Reasonably good range of motion of shoulders, elbows, wrists, hips, knees and ankles.  Pulses: 2+ and symmetric Skin: Skin color, texture, turgor normal. No rashes or lesions Lymph nodes: Cervical, supraclavicular, and axillary nodes normal. Neurologic: Alert. Distracted. Mildly confused without any focal neurological deficits. Cranial nerve II-12 intact.  Laboratory Data: Results for orders placed or performed during the hospital encounter of 03/05/15 (from the past 48 hour(s))  Basic metabolic panel     Status: Abnormal   Collection Time: 03/05/15  7:35 PM  Result Value Ref Range   Sodium 139 135 - 145 mmol/L   Potassium 2.8 (L) 3.5 - 5.1 mmol/L   Chloride 96 (L) 101 - 111 mmol/L   CO2 32 22 - 32 mmol/L   Glucose, Bld 325 (H) 65 - 99 mg/dL   BUN 8 6 - 20 mg/dL   Creatinine, Ser 0.87 0.44 - 1.00 mg/dL   Calcium 9.7 8.9 - 10.3 mg/dL   GFR calc non Af Amer 59 (L) >60 mL/min   GFR calc Af Amer >60 >60 mL/min    Comment: (NOTE) The eGFR has been calculated using the CKD EPI equation. This calculation has not been validated in all clinical situations. eGFR's persistently <60 mL/min signify possible Chronic Kidney Disease.    Anion gap 11 5 - 15  CBC     Status: Abnormal     Collection Time: 03/05/15  7:35 PM  Result Value Ref Range   WBC 4.9 4.0 - 10.5 K/uL   RBC 3.89 3.87 - 5.11 MIL/uL   Hemoglobin 11.7 (L) 12.0 - 15.0 g/dL   HCT 33.4 (L) 36.0 - 46.0 %   MCV 85.9 78.0 - 100.0 fL   MCH 30.1 26.0 - 34.0 pg   MCHC 35.0 30.0 - 36.0 g/dL   RDW 12.7 11.5 - 15.5 %   Platelets 176 150 - 400 K/uL  I-stat troponin, ED     Status: None   Collection Time: 03/05/15  8:03 PM  Result Value Ref Range   Troponin i, poc 0.02 0.00 - 0.08 ng/mL   Comment 3            Comment: Due to the release kinetics of cTnI, a negative result within the first hours of the onset of symptoms does not rule out myocardial infarction with certainty. If myocardial infarction is still suspected, repeat the test at appropriate intervals.     Radiology Reports: Dg Chest 2 View  03/05/2015  CLINICAL DATA:  Intermittent chest pain and back pain for several months EXAM: CHEST  2 VIEW COMPARISON:  05/28/2012 FINDINGS: Cardiomediastinal silhouette is stable. No acute infiltrate or pleural effusion. No pulmonary edema. Atherosclerotic calcifications of thoracic aorta. Mild degenerative changes lower thoracic spine. Diffuse osteopenia. Stable hyperinflation and chronic mild interstitial prominence. IMPRESSION: No active cardiopulmonary disease. Stable hyperinflation and chronic mild interstitial prominence. Electronically Signed   By: Lahoma Crocker M.D.   On: 03/05/2015 20:01   Ct Head Wo Contrast  03/05/2015  CLINICAL DATA:  Acute onset of difficulty breathing. Dizziness when standing up. Initial encounter. EXAM: CT HEAD WITHOUT CONTRAST TECHNIQUE: Contiguous axial images were obtained from the base of the skull through the vertex without intravenous contrast. COMPARISON:  CTA of the head performed 05/28/2012, and MRI of the brain performed 05/29/2012 FINDINGS: There is no evidence of acute infarction, mass lesion, or intra- or extra-axial hemorrhage on CT. Prominence of the ventricles and sulci  reflects mild to moderate cortical volume loss. Mild cerebellar atrophy is noted. Scattered periventricular and subcortical white matter change likely reflects small vessel ischemic microangiopathy. Small  chronic lacunar infarcts are noted in the right basal ganglia. The brainstem and fourth ventricle are within normal limits. The cerebral hemispheres demonstrate grossly normal gray-white differentiation. No mass effect or midline shift is seen. There is no evidence of fracture; visualized osseous structures are unremarkable in appearance. The orbits are within normal limits. The paranasal sinuses and mastoid air cells are well-aerated. No significant soft tissue abnormalities are seen. IMPRESSION: 1. No acute intracranial pathology seen on CT. 2. Mild to moderate cortical volume loss and scattered small vessel ischemic microangiopathy. 3. Small chronic lacunar infarcts at the right basal ganglia. Electronically Signed   By: Garald Balding M.D.   On: 03/05/2015 21:54    My interpretation of Electrocardiogram: Sinus rhythm at 61 bpm. Normal axis. Intervals normal. No Q waves. No concerning ST or T-wave changes.  Problem List  Principal Problem:   Syncope Active Problems:   CKD (chronic kidney disease) stage 2, GFR 60-89 ml/min   Hyperlipidemia   CAD (coronary artery disease), severe single vessel LAD disease, nl. EF   Shortness of breath on exertion   Essential hypertension   Chest pain   Assessment: This is a 82 year old [name] with the past medical history as stated earlier, who presents after sustaining a fall at home. She could've had a syncopal episode, although it's not entirely clear. Evaluation has revealed hypokalemia. UA is pending. Patient does not have any focal neurological deficits at this time.  Plan: #1 Fall with possible syncope: She'll be observed overnight on telemetry. Check orthostatics. She appears to be slightly dehydrated. We will give her gentle IV hydration.  Hold her diuretics. Repeat her potassium. Check magnesium level. PT and OT evaluation. Follow-up on UA. Examination does not reveal any musculoskeletal injuries.  #2 History of coronary artery disease on medical management: She is followed on an outpatient basis by cardiology. Continue home medications.  #3 Accelerated hypertension: Blood pressure is poorly controlled due to the fact that she hasn't taken any of her medications today. There is no chest pain or headaches. We will give her a dose of hydralazine in the emergency department and resume her home medications.  #4 Dyspnea on exertion: Patient's son tells me that this has been an ongoing issue for a while. She does have a history of coronary artery disease, which could be contributing. She had an echocardiogram recently and over, which did not show any systolic dysfunction. No evidence for CHF on examination. Patient denies any history of smoking herself but has been exposed to passive smoking. She's never had Pulmonary function tests. I have explained to the son that there may be some benefit in requesting this from her primary care physician. We will also check O2 saturations with ambulation.  #5 Diabetes mellitus type 2: Recently stopped taking her insulin. This could explain her poorly controlled diabetes. Check HbA1c. Sliding scale coverage. Might have to resume her Levemir.  #6 Hypokalemia: Likely secondary to diuretics. Repeat aggressively. Check magnesium level in the morning.   DVT Prophylaxis: Lovenox Code Status: Full code **Informed by Rn (Ricki) that patient and son has DNR paperwork at home. And expressed a wish for DNR. Will change order.**  Family Communication: Discussed with the patient and her son  Disposition Plan: Observe to telemetry   Further management decisions will depend on results of further testing and patient's response to treatment.   [facility]  [facility] Pager [phone]  If 7PM-7AM,  please contact night-coverage [email or web address] Password [facility]  03/05/2015, 10:39 PM

## 2015-03-05 NOTE — ED Provider Notes (Signed)
CSN: [id number]     Arrival date & time 03/05/15  1914 History   First MD Initiated Contact with Patient 03/05/15 2045     Chief Complaint  Patient presents with  . Chest Pain     (Consider location/radiation/quality/duration/timing/severity/associated sxs/prior Treatment) HPI   82 year old female with a past history of coronary artery disease, CHF, failure to thrive who is coming in today with her son because of chest pain which she complains about intermittently. She is a very poor historian and is unable to describe the pain further. She also complains of pain everywhere else in her body. She might have had a syncopal episode today. The son was away from home and when he got home she was lying on the floor with a blanket underneath her head. She cannot recall how she ended up on the floor. She has a history of CHF she has no pacemaker. She thinks she blacked out.  Past Medical History  Diagnosis Date  . Hypertension   . GERD (gastroesophageal reflux disease)   . Edema leg 12/06/2011  . Hyperlipidemia 12/06/2011  . CAD (coronary artery disease), severe single vessel LAD disease, nl. EF 12/08/2011    Unsuccessful PCI  . Shortness of breath     "all the time since last Friday" (12/12/2011)  . Type II diabetes mellitus (HCC)   . CKD (chronic kidney disease) stage 2, GFR 60-89 ml/min 12/06/2011  . Stroke [facility])     "several small strokes"; denies residual  (12/12/2011)  . Arthritis     "all my joints" (12/12/2011)  . Anorexia   . Failure to thrive   . Memory loss    Past Surgical History  Procedure Laterality Date  . Pseudoaneurysm repair  12/12/2011    "no surgery; did ultrasonic; right femoral compression"  . Appendectomy    . Cholecystectomy    . Colectomy      "took out 27 1/2 inches" (12/12/2011)  . Lumbar disc surgery      "2 discs removed, spacer put in" (12/12/2011)  . Carpal tunnel release      bilaterally  . Hammer toe surgery      right  . Abdominal  hysterectomy    . Back surgery    . Coronary angioplasty with stent placement  12/08/2011    "failed"  . Cardiac catheterization  12/07/2011  . Left heart catheterization with coronary angiogram N/A 12/07/2011    Procedure: LEFT HEART CATHETERIZATION WITH CORONARY ANGIOGRAM;  Surgeon: Marykay Lex, MD;  Location: [facility] CATH LAB;  Service: Cardiovascular;  Laterality: N/A;  . Percutaneous coronary stent intervention (pci-s) Bilateral 12/08/2011    Procedure: PERCUTANEOUS CORONARY STENT INTERVENTION (PCI-S);  Surgeon: [name], MD;  Location: [facility] CATH LAB;  Service: Cardiovascular;  Laterality: Bilateral;   Family History  Problem Relation Age of Onset  . Leukemia Brother   . Cancer Brother     ?  . Diabetes      Both Maternal and Paternal sides   Social History  Substance Use Topics  . Smoking status: Never Smoker   . Smokeless tobacco: Never Used  . Alcohol Use: No   OB History    No data available     Review of Systems  Constitutional: Negative for fever and chills.  HENT: Negative for nosebleeds.   Eyes: Negative for visual disturbance.  Respiratory: Negative for cough and shortness of breath.   Cardiovascular: Positive for chest pain.  Gastrointestinal: Negative for nausea, vomiting, abdominal pain, diarrhea and  constipation.  Genitourinary: Negative for dysuria.  Skin: Negative for rash.  Neurological: Positive for syncope. Negative for weakness.  All other systems reviewed and are negative.     Allergies  Review of patient's allergies indicates no known allergies.  Home Medications   Prior to Admission medications   Medication Sig Start Date End Date Taking? Authorizing Provider  aspirin EC 81 MG tablet Take 81 mg by mouth daily.   Yes Historical Provider, MD  atorvastatin (LIPITOR) 20 MG tablet TAKE 1 TABLET BY MOUTH DAILY AS DIRECTED 09/24/14  Yes [name], MD  glimepiride (AMARYL) 4 MG tablet Take 4 mg by mouth daily with breakfast.   Yes  Historical Provider, MD  isosorbide mononitrate (IMDUR) 60 MG 24 hr tablet TAKE 1 TABLET BY MOUTH EVERY MORNING AND TAKE 1/2 TABLET BY MOUTH EVERY EVENING 06/15/14  Yes [name], MD  levETIRAcetam (KEPPRA) 750 MG tablet Take 1 tablet (750 mg total) by mouth 2 (two) times daily. 06/03/12  Yes Geoffry Paradise, MD  lisinopril (PRINIVIL,ZESTRIL) 10 MG tablet Take 1 tablet (10 mg total) by mouth daily. 08/07/14  Yes [name], MD  metoprolol tartrate (LOPRESSOR) 25 MG tablet TAKE 1 TABLET BY MOUTH TWICE DAILY 10/12/14  Yes [name], MD  Multiple Vitamin (MULTIVITAMIN WITH MINERALS) TABS tablet Take 1 tablet by mouth daily.   Yes Historical Provider, MD  NITROSTAT 0.4 MG SL tablet DISSOLVE 1 TABLET UNDER THE TONGUE EVERY 5 MINUTES AS NEEDED FOR CHEST FOR PAIN AS DIRECTED 11/27/14  Yes [name], MD  omeprazole (PRILOSEC) 20 MG capsule Take 20 mg by mouth daily as needed (heartburn).    Yes Historical Provider, MD  polyethylene glycol powder (GLYCOLAX/MIRALAX) powder TAKE 6 CAPFULS OF MIRALAX IN A 32 OUNCE GATORADE AND DRINK THE WHOLE BEVERAGE FOLLOWED BY 3 CAPFULS TWICE A DAY FOR THE NEXT WEEK AND FOLLOW UP WITH YOUR PRIMARY CARE PHYSICIAN. Patient taking differently: Take 0.5 Containers by mouth every other day.  07/21/14  Yes Lyndal Pulley, MD  ranolazine (RANEXA) 500 MG 12 hr tablet Take 1 tablet (500 mg total) by mouth 3 (three) times daily. 02/25/15  Yes [name], MD  torsemide (DEMADEX) 20 MG tablet TAKE 1 TABLET BY MOUTH EVERY MORNING ON ODD DAYS AND 1 TABLET TWICE DAILY ON EVEN DAYS Patient taking differently: TAKE 1 TABLET BY MOUTH TWICE DAILY. 11/05/14  Yes [name], MD  traMADol (ULTRAM) 50 MG tablet Take 25-50 mg by mouth every 4 (four) hours as needed for moderate pain.  02/27/14  Yes Historical Provider, MD   BP 145/72 mmHg  Pulse 68  Temp(Src) 98.7 F (37.1 C) (Oral)  Resp 18  Ht 4\' 8"  (1.422 m)  Wt 32.3 kg  BMI 15.97 kg/m2  SpO2 100% Physical Exam   Constitutional: She is oriented to person, place, and time. No distress.  HENT:  Head: Normocephalic and atraumatic.  Eyes: EOM are normal. Pupils are equal, round, and reactive to light.  Neck: Normal range of motion. Neck supple.  Cardiovascular: Normal rate and intact distal pulses.   Pulmonary/Chest: No respiratory distress.  Abdominal: Soft. There is no tenderness.  Musculoskeletal: Normal range of motion.  Neurological: She is alert and oriented to person, place, and time.  Skin: No rash noted. She is not diaphoretic.  Psychiatric: She has a normal mood and affect.    ED Course  Procedures (including critical care time) Labs Review Labs Reviewed  BASIC METABOLIC PANEL - Abnormal;  Notable for the following:    Potassium 2.8 (*)    Chloride 96 (*)    Glucose, Bld 325 (*)    GFR calc non Af Amer 59 (*)    All other components within normal limits  CBC - Abnormal; Notable for the following:    Hemoglobin 11.7 (*)    HCT 33.4 (*)    All other components within normal limits  URINALYSIS, ROUTINE W REFLEX MICROSCOPIC (NOT AT [facility]) - Abnormal; Notable for the following:    Glucose, UA >1000 (*)    Protein, ur 100 (*)    All other components within normal limits  URINE MICROSCOPIC-ADD ON - Abnormal; Notable for the following:    Squamous Epithelial / LPF 0-5 (*)    Bacteria, UA RARE (*)    All other components within normal limits  GLUCOSE, CAPILLARY - Abnormal; Notable for the following:    Glucose-Capillary 186 (*)    All other components within normal limits  URINE CULTURE  CK  TROPONIN I  TROPONIN I  TROPONIN I  COMPREHENSIVE METABOLIC PANEL  CBC  MAGNESIUM  HEMOGLOBIN A1C  I-STAT TROPOININ, ED    Imaging Review Dg Chest 2 View  03/05/2015  CLINICAL DATA:  Intermittent chest pain and back pain for several months EXAM: CHEST  2 VIEW COMPARISON:  05/28/2012 FINDINGS: Cardiomediastinal silhouette is stable. No acute infiltrate or pleural effusion. No pulmonary  edema. Atherosclerotic calcifications of thoracic aorta. Mild degenerative changes lower thoracic spine. Diffuse osteopenia. Stable hyperinflation and chronic mild interstitial prominence. IMPRESSION: No active cardiopulmonary disease. Stable hyperinflation and chronic mild interstitial prominence. Electronically Signed   By: Natasha Mead M.D.   On: 03/05/2015 20:01   Ct Head Wo Contrast  03/05/2015  CLINICAL DATA:  Acute onset of difficulty breathing. Dizziness when standing up. Initial encounter. EXAM: CT HEAD WITHOUT CONTRAST TECHNIQUE: Contiguous axial images were obtained from the base of the skull through the vertex without intravenous contrast. COMPARISON:  CTA of the head performed 05/28/2012, and MRI of the brain performed 05/29/2012 FINDINGS: There is no evidence of acute infarction, mass lesion, or intra- or extra-axial hemorrhage on CT. Prominence of the ventricles and sulci reflects mild to moderate cortical volume loss. Mild cerebellar atrophy is noted. Scattered periventricular and subcortical white matter change likely reflects small vessel ischemic microangiopathy. Small chronic lacunar infarcts are noted in the right basal ganglia. The brainstem and fourth ventricle are within normal limits. The cerebral hemispheres demonstrate grossly normal gray-white differentiation. No mass effect or midline shift is seen. There is no evidence of fracture; visualized osseous structures are unremarkable in appearance. The orbits are within normal limits. The paranasal sinuses and mastoid air cells are well-aerated. No significant soft tissue abnormalities are seen. IMPRESSION: 1. No acute intracranial pathology seen on CT. 2. Mild to moderate cortical volume loss and scattered small vessel ischemic microangiopathy. 3. Small chronic lacunar infarcts at the right basal ganglia. Electronically Signed   By: Roanna Raider M.D.   On: 03/05/2015 21:54   I have personally reviewed and evaluated these images and lab  results as part of my medical decision-making.   EKG Interpretation None      MDM   Final diagnoses:  Chest pain, unspecified chest pain type    82 year old female with past medical history of coronary disease and CHF who comes in with chest pain and possible syncopal episode. Exam as above vital signs stable she is hypertensive.  ekg w/o long qt, wpw, brugada.  Cbc/bmp  remarkable for hypokalemia.  First trop neg. cxr wnl.  Given age and h/o chf, would be concerned for cardiogenic cause of syncope.  Will admit for syncope obs and chest pain rule out.  Though patient is unable to describe her chest pain well, doubt pe given not hypoxic, not tachycardic.    Silas Flood, MD 03/06/15 0010  Abelino Derrick, MD 03/07/15 4540

## 2015-03-05 NOTE — ED Notes (Signed)
Pt reports intermittent centralized chest pain radiating to left arm all day today. Pt reports shortness of breath with the pain. Pt states she can not breath and get dizzy when she stands up.

## 2015-03-05 NOTE — Progress Notes (Addendum)
Spoke with patient's son regarding pt's code status. He informed me that she is a DNR and that he has the document at home. He was asked to bring it in when he comes to visit in am. Dr. Rito Ehrlich made aware.

## 2015-03-05 NOTE — ED Notes (Signed)
Patient transported to CT 

## 2015-03-05 NOTE — ED Notes (Signed)
Son in triage states pt fell at home out of her recliner, unknown what time. Son found his mom around [phone]. Son states his mom reports chronic pain.

## 2015-03-06 ENCOUNTER — Encounter (HOSPITAL_COMMUNITY): Payer: Self-pay | Admitting: *Deleted

## 2015-03-06 DIAGNOSIS — R0789 Other chest pain: Secondary | ICD-10-CM | POA: Diagnosis not present

## 2015-03-06 DIAGNOSIS — I2510 Atherosclerotic heart disease of native coronary artery without angina pectoris: Secondary | ICD-10-CM

## 2015-03-06 DIAGNOSIS — E785 Hyperlipidemia, unspecified: Secondary | ICD-10-CM | POA: Diagnosis not present

## 2015-03-06 DIAGNOSIS — I10 Essential (primary) hypertension: Secondary | ICD-10-CM

## 2015-03-06 DIAGNOSIS — N182 Chronic kidney disease, stage 2 (mild): Secondary | ICD-10-CM | POA: Diagnosis not present

## 2015-03-06 DIAGNOSIS — R55 Syncope and collapse: Secondary | ICD-10-CM

## 2015-03-06 LAB — COMPREHENSIVE METABOLIC PANEL
ALT: 13 U/L — ABNORMAL LOW (ref 14–54)
ANION GAP: 6 (ref 5–15)
AST: 16 U/L (ref 15–41)
Albumin: 2.6 g/dL — ABNORMAL LOW (ref 3.5–5.0)
Alkaline Phosphatase: 50 U/L (ref 38–126)
BUN: 6 mg/dL (ref 6–20)
CHLORIDE: 107 mmol/L (ref 101–111)
CO2: 30 mmol/L (ref 22–32)
Calcium: 9 mg/dL (ref 8.9–10.3)
Creatinine, Ser: 0.74 mg/dL (ref 0.44–1.00)
Glucose, Bld: 177 mg/dL — ABNORMAL HIGH (ref 65–99)
POTASSIUM: 4.5 mmol/L (ref 3.5–5.1)
Sodium: 143 mmol/L (ref 135–145)
Total Bilirubin: 1.2 mg/dL (ref 0.3–1.2)
Total Protein: 4.7 g/dL — ABNORMAL LOW (ref 6.5–8.1)

## 2015-03-06 LAB — CBC
HEMATOCRIT: 32.9 % — AB (ref 36.0–46.0)
Hemoglobin: 11.7 g/dL — ABNORMAL LOW (ref 12.0–15.0)
MCH: 31 pg (ref 26.0–34.0)
MCHC: 35.6 g/dL (ref 30.0–36.0)
MCV: 87 fL (ref 78.0–100.0)
PLATELETS: 160 10*3/uL (ref 150–400)
RBC: 3.78 MIL/uL — AB (ref 3.87–5.11)
RDW: 12.7 % (ref 11.5–15.5)
WBC: 6.2 10*3/uL (ref 4.0–10.5)

## 2015-03-06 LAB — CK: Total CK: 41 U/L (ref 38–234)

## 2015-03-06 LAB — GLUCOSE, CAPILLARY
Glucose-Capillary: 149 mg/dL — ABNORMAL HIGH (ref 65–99)
Glucose-Capillary: 99 mg/dL (ref 65–99)

## 2015-03-06 LAB — TROPONIN I: Troponin I: 0.03 ng/mL (ref <0.031)

## 2015-03-06 LAB — MAGNESIUM: Magnesium: 1.7 mg/dL (ref 1.7–2.4)

## 2015-03-06 NOTE — Evaluation (Signed)
Physical Therapy Evaluation Patient Details Name: Julia [name]. Whitmyer MRN: [id number] DOB: [birth year] Today's Date: 03/06/2015   History of Present Illness  Pt is a 82 y/o F admitted after having syncopal episode and fall as well as diarrhea and abdominal pain (r/o C diff).  Pt's PMH includes CAD, DMII, seizure disorder, HTN.    Clinical Impression  Pt admitted with above diagnosis. Pt currently with functional limitations due to the deficits listed below (see PT Problem List). Julia [name] is very agitated, attempting to hit therapist and RN.  Refuses to use RW or therapist assist to ambulate and holds onto son's hands, unsteady and staggering.  Pt told son, "there isn't a day that goes by that I don't fall".  Son works 8-5, pt alone most of the day.  Recommending ST SNF as pt currently requires assist for safe mobility.  Pt will benefit from skilled PT to increase their independence and safety with mobility to allow discharge to the venue listed below.      Follow Up Recommendations SNF;Supervision/Assistance - 24 hour    Equipment Recommendations  None recommended by PT    Recommendations for Other Services       Precautions / Restrictions Precautions Precautions: Fall Precaution Comments: pt very impulsive and agitated Restrictions Weight Bearing Restrictions: No      Mobility  Bed Mobility Overal bed mobility: Needs Assistance;+ 2 for safety/equipment Bed Mobility: Supine to Sit     Supine to sit: Min assist;+2 for safety/equipment;HOB elevated     General bed mobility comments: Pt very agitated and requires assist +2 for safety.  Pt attempting to get OOB while rails still up and w/o socks on  Transfers Overall transfer level: Needs assistance Equipment used: Rolling walker (2 wheeled);1 person hand held assist Transfers: Sit to/from Stand Sit to Stand: +2 safety/equipment;Min assist         General transfer comment: Assist to steady as pt is impulsive.  After  ambulating pt sitting in chair (not recliner) w/o chair pad alarm and begins hitting RN and therapist w/ attempt to assist her back to bed.  Chair pulled closer to bed at distance so chair alarm pad could be activated.    Ambulation/Gait Ambulation/Gait assistance: Min assist Ambulation Distance (Feet): 150 Feet Assistive device: 1 person hand held assist;Rolling walker (2 wheeled) Gait Pattern/deviations: Step-through pattern;Staggering left;Staggering right;Decreased stride length;Trunk flexed;Narrow base of support   Gait velocity interpretation: Below normal speed for age/gender General Gait Details: Pt initially uses RW and then refuses to use it and agrees to hold her son's hands.  Pt staggering due to instability and is determined to walk home.   Stairs            Wheelchair Mobility    Modified Rankin (Stroke Patients Only)       Balance Overall balance assessment: Needs assistance;History of Falls (pt to son "there isn't a day that goes by that I don't fall") Sitting-balance support: Bilateral upper extremity supported;Feet supported Sitting balance-Leahy Scale: Fair     Standing balance support: Bilateral upper extremity supported;During functional activity Standing balance-Leahy Scale: Poor Standing balance comment: Bil UE HHA due to unsteadiness                             Pertinent Vitals/Pain Pain Assessment: Faces Faces Pain Scale: Hurts even more Pain Location: "my bones are moving around, there's one in my head", "my heart and arteries" Pain Descriptors /  Indicators: Crying Pain Intervention(s): Limited activity within patient's tolerance;Monitored during session    Home Living Family/patient expects to be discharged to:: Private residence Living Arrangements: Children Available Help at Discharge: Family;Available PRN/intermittently (son works 8-5, pt is alone during that time) Type of Home: House Home Access: Ramped entrance     Home  Layout: One level Home Equipment: Environmental consultant - 2 wheels      Prior Function Level of Independence: Independent with assistive device(s)         Comments: Per son pt was Ind PTA w/ all ADLs      Hand Dominance        Extremity/Trunk Assessment   Upper Extremity Assessment: Overall WFL for tasks assessed           Lower Extremity Assessment: Overall WFL for tasks assessed         Communication   Communication: No difficulties  Cognition Arousal/Alertness: Awake/alert Behavior During Therapy: Agitated;Restless;Impulsive Overall Cognitive Status: History of cognitive impairments - at baseline Area of Impairment: Orientation;Safety/judgement;Awareness;Problem solving;Memory Orientation Level: Disoriented to;Place;Time;Situation   Memory: Decreased short-term memory   Safety/Judgement: Decreased awareness of safety Awareness: Emergent Problem Solving: Slow processing General Comments: Son reports that's pt's cognitive status fluctuates.  Sometimes she is completely normal and the next day she is confused.  He reports that her cognitive status today is not unusual    General Comments General comments (skin integrity, edema, etc.): Pt continues to repeat "I just want to be left alone" and shouting at therapist and RN    Exercises        Assessment/Plan    PT Assessment Patient needs continued PT services  PT Diagnosis Difficulty walking;Altered mental status   PT Problem List Decreased activity tolerance;Decreased balance;Decreased knowledge of use of DME;Decreased safety awareness;Pain  PT Treatment Interventions DME instruction;Gait training;Functional mobility training;Therapeutic activities;Therapeutic exercise;Balance training;Neuromuscular re-education;Cognitive remediation;Patient/family education   PT Goals (Current goals can be found in the Care Plan section) Acute Rehab PT Goals Patient Stated Goal: to go home PT Goal Formulation: With family Time For  Goal Achievement: 03/13/15 Potential to Achieve Goals: Fair    Frequency Min 2X/week   Barriers to discharge Decreased caregiver support alone during most of day    Co-evaluation               End of Session Equipment Utilized During Treatment: Other (comment) (pt refused gait belt) Activity Tolerance: Treatment limited secondary to agitation Patient left: in chair;with call bell/phone within reach;with chair alarm set;with family/visitor present Nurse Communication: Mobility status    Functional Assessment Tool Used: Clinical Judgement Functional Limitation: Mobility: Walking and moving around Mobility: Walking and Moving Around Current Status [phone]): At least 20 percent but less than 40 percent impaired, limited or restricted Mobility: Walking and Moving Around Goal Status [phone]): At least 1 percent but less than 20 percent impaired, limited or restricted    Time: 1138-1205 PT Time Calculation (min) (ACUTE ONLY): 27 min   Charges:   PT Evaluation $PT Eval Moderate Complexity: 1 Procedure PT Treatments $Gait Training: 8-22 mins   PT G Codes:   PT G-Codes **NOT FOR INPATIENT CLASS** Functional Assessment Tool Used: Clinical Judgement Functional Limitation: Mobility: Walking and moving around Mobility: Walking and Moving Around Current Status (W2956): At least 20 percent but less than 40 percent impaired, limited or restricted Mobility: Walking and Moving Around Goal Status [phone]): At least 1 percent but less than 20 percent impaired, limited or restricted   Michail Jewels PT, DPT  [phone] Pager: [phone] 03/06/2015, 12:41 PM

## 2015-03-06 NOTE — Progress Notes (Signed)
Patient became agitated and physically aggressive after son arrived.  She refused cbg check and lab draw.  She was confused about where she was as well. Patient attempted to remove IV, I secured it with tape and kerlex.   Patient seen by PT and refused to get back in the bed.  Chair alarm placed for patient safety.  Will continue to monitor to patient.

## 2015-03-06 NOTE — Progress Notes (Signed)
Pt easily agitated  Attempted to obtain vital signs. Pt refusing at this time. Son in to sit with patient.

## 2015-03-06 NOTE — Progress Notes (Signed)
Julia [name] QQV:[id number] DOB: 1927-08-21 DOA: 03/05/2015 PCP: Minda Meo, MD  Brief narrative:  82 y/o ? CAD svere single vessle LAD medically menaged HTN TyII DM Cervical spondylosis symtpomatic bradyacardia documented Prior HTN emergency Prior Dizzyness and "blacking out" 01/2012-Ct head chr disease  Admitted to the hospital 1/20 with passing out episode-having abdominal pain and had an episode of diarrhea this a.m. Nursing reports very tearful and somewhat unintelligible   Past medical history-As per Problem list Chart reviewed as below- Reviewed  Consultants:  None  Procedures:  None  Antibiotics:  None   Subjective   Alert but anxious and tearful, noted tremor of the mouth No nausea no vomiting Diarrhea 1   Objective    Interim History:   Telemetry: Sinus bradycardia   Objective: Filed Vitals:   03/06/15 0400 03/06/15 0600 03/06/15 0747 03/06/15 0901  BP: 139/52  159/61 169/50  Pulse: 60  55   Temp: 98.8 F (37.1 C)  98.2 F (36.8 C)   TempSrc: Oral  Oral   Resp: 18  18   Height:      Weight:  33.2 kg (73 lb 3.1 oz)    SpO2: 95%  95%     Intake/Output Summary (Last 24 hours) at 03/06/15 1051 Last data filed at 03/06/15 0600  Gross per 24 hour  Intake 757.17 ml  Output    355 ml  Net 402.17 ml    Exam:  General: EOMI NCAT Cardiovascular: S1-S2 no murmur rub or gallop  Respiratory: clinically clear no added sound Abdomen:  slightly tender Skin no lower extremity edema  Data Reviewed: Basic Metabolic Panel:  Recent Labs Lab 03/05/15 1935 03/06/15 0457  NA 139 143  K 2.8* 4.5  CL 96* 107  CO2 32 30  GLUCOSE 325* 177*  BUN 8 6  CREATININE 0.87 0.74  CALCIUM 9.7 9.0  MG  --  1.7   Liver Function Tests:  Recent Labs Lab 03/06/15 0457  AST 16  ALT 13*  ALKPHOS 50  BILITOT 1.2  PROT 4.7*  ALBUMIN 2.6*   No results for input(s): LIPASE, AMYLASE in the last 168 hours. No results for input(s):  AMMONIA in the last 168 hours. CBC:  Recent Labs Lab 03/05/15 1935 03/06/15 0457  WBC 4.9 6.2  HGB 11.7* 11.7*  HCT 33.4* 32.9*  MCV 85.9 87.0  PLT 176 160   Cardiac Enzymes:  Recent Labs Lab 03/05/15 2318 03/06/15 0457  CKTOTAL 41  --   TROPONINI 0.03 <0.03   BNP: Invalid input(s): POCBNP CBG:  Recent Labs Lab 03/05/15 2302 03/06/15 0749  GLUCAP 186* 149*    Recent Results (from the past 240 hour(s))  Urine culture     Status: None (Preliminary result)   Collection Time: 03/05/15 10:18 PM  Result Value Ref Range Status   Specimen Description URINE, CATHETERIZED  Final   Special Requests NONE  Final   Culture NO GROWTH < 12 HOURS  Final   Report Status PENDING  Incomplete     Studies:              All Imaging reviewed and is as per above notation   Scheduled Meds: . aspirin EC  81 mg Oral Daily  . atorvastatin  20 mg Oral Daily  . docusate sodium  100 mg Oral BID  . enoxaparin (LOVENOX) injection  30 mg Subcutaneous Daily  . insulin aspart  0-9 Units Subcutaneous TID WC  . isosorbide mononitrate  90 mg Oral  Daily  . levETIRAcetam  750 mg Oral BID  . lisinopril  10 mg Oral Daily  . metoprolol tartrate  25 mg Oral BID  . multivitamin with minerals  1 tablet Oral Daily  . ranolazine  500 mg Oral TID  . sodium chloride  3 mL Intravenous Q12H   Continuous Infusions: . sodium chloride 50 mL/hr at 03/06/15 0600     Assessment/Plan:  1. Diarrhea-if has another stool, rule out C. Difficile--hold Colace for now 2. Fall /syncope-patient is on lisinopril 10 daily, Imdur 60 a.m. 30 p.m., metoprolol 25 daily, torsemide special dose 20 mg daily and then twice daily on other days--the skin causes syncope. Check orthostatics.  Would discontinue lisinopril and attempt to keep on Imdur and metoprolol given CAD history 3. CAD, severe LAD obstruction medically managed-see above discussion. In addition continue Ranexa 500 3 times a day, aspirin 81  daily 4. Hyperlipidemia continue atorvastatin 20 daily 5. Hypokalemia-check labs in a.m.-replaced with IV repletion 6. Sinus bradycardia-difficult problem. May need adjustment of medication regimen as an outpatient   Talked to Son DNR Keep tele Obs overnight  Pleas Koch, MD  [facility] Pager [phone] 03/06/2015, 10:51 AM

## 2015-03-07 DIAGNOSIS — I10 Essential (primary) hypertension: Secondary | ICD-10-CM | POA: Diagnosis not present

## 2015-03-07 DIAGNOSIS — E785 Hyperlipidemia, unspecified: Secondary | ICD-10-CM

## 2015-03-07 DIAGNOSIS — R0602 Shortness of breath: Secondary | ICD-10-CM

## 2015-03-07 DIAGNOSIS — R0789 Other chest pain: Secondary | ICD-10-CM | POA: Diagnosis not present

## 2015-03-07 DIAGNOSIS — R55 Syncope and collapse: Secondary | ICD-10-CM | POA: Diagnosis not present

## 2015-03-07 LAB — BASIC METABOLIC PANEL
ANION GAP: 5 (ref 5–15)
BUN: 6 mg/dL (ref 6–20)
CALCIUM: 9.2 mg/dL (ref 8.9–10.3)
CO2: 28 mmol/L (ref 22–32)
Chloride: 110 mmol/L (ref 101–111)
Creatinine, Ser: 0.78 mg/dL (ref 0.44–1.00)
Glucose, Bld: 99 mg/dL (ref 65–99)
Potassium: 4 mmol/L (ref 3.5–5.1)
SODIUM: 143 mmol/L (ref 135–145)

## 2015-03-07 LAB — URINE CULTURE: Culture: NO GROWTH

## 2015-03-07 LAB — GLUCOSE, CAPILLARY
GLUCOSE-CAPILLARY: 105 mg/dL — AB (ref 65–99)
Glucose-Capillary: 95 mg/dL (ref 65–99)

## 2015-03-07 MED ORDER — METOPROLOL TARTRATE 25 MG PO TABS: 12.5000 mg | ORAL_TABLET | Freq: Two times a day (BID) | ORAL | Status: DC

## 2015-03-07 MED ORDER — TORSEMIDE 20 MG PO TABS: 20.0000 mg | ORAL_TABLET | ORAL | Status: AC

## 2015-03-07 MED ORDER — METOPROLOL TARTRATE 12.5 MG HALF TABLET: 12.5000 mg | ORAL_TABLET | Freq: Two times a day (BID) | ORAL | Status: DC

## 2015-03-07 NOTE — Care Management Note (Signed)
Case Management Note  Patient Details  Name: Julia [name]. Bacigalupi MRN: [id number] Date of Birth: 1927/09/14  Subjective/Objective:                  Fall with possible passing out episode Action/Plan: Discharge planning Expected Discharge Date:  03/07/15               Expected Discharge Plan:  Home w Home Health Services  In-House Referral:     Discharge planning Services  CM Consult  Post Acute Care Choice:    Choice offered to:  Patient, Adult Children  DME Arranged:  N/A DME Agency:     HH Arranged:  RN, PT, Nurse's Aide HH Agency:  [facility]  Status of Service:  Completed, signed off  Medicare Important Message Given:    Date Medicare IM Given:    Medicare IM give by:    Date Additional Medicare IM Given:    Additional Medicare Important Message give by:     If discussed at Long Length of Stay Meetings, dates discussed:    Additional Comments: CM received call from RN stating pt to be discharged and would I please arrange for HHPT/RN/Aide.  CM called into room and pt is very HOH so pt's son, Naela Nodal chose [facility] to render HHPT/RN/Aide.  Dhara Schepp can be reached at [phone] for [facility] to schedule [facility] services.  Referral called to [facility] rep, Tiffany.  Pt has rolling walker at home and does not need any other DME.  No other CM needs were communicated. Yves Dill, RN 03/07/2015, 2:09 PM

## 2015-03-07 NOTE — Discharge Instructions (Signed)
Syncope Syncope means a person passes out (faints). The person usually wakes up in less than 5 minutes. It is important to seek medical care for syncope. HOME CARE  Have someone stay with you until you feel normal.  Do not drive, use machines, or play sports until your doctor says it is okay.  Keep all doctor visits as told.  Lie down when you feel like you might pass out. Take deep breaths. Wait until you feel normal before standing up.  Drink enough fluids to keep your pee (urine) clear or pale yellow.  If you take blood pressure or heart medicine, get up slowly. Take several minutes to sit and then stand. GET HELP RIGHT AWAY IF:   You have a severe headache.  You have pain in the chest, belly (abdomen), or back.  You are bleeding from the mouth or butt (rectum).  You have black or tarry poop (stool).  You have an irregular or very fast heartbeat.  You have pain with breathing.  You keep passing out, or you have shaking (seizures) when you pass out.  You pass out when sitting or lying down.  You feel confused.  You have trouble walking.  You have severe weakness.  You have vision problems. If you fainted, call for help (911 in [place] Do not drive yourself to the hospital.   This information is not intended to replace advice given to you by your health care provider. Make sure you discuss any questions you have with your health care provider.   Document Released: 07/19/2007 Document Revised: 06/16/2014 Document Reviewed: 03/31/2011 Elsevier Interactive Patient Education 2016 [employer].   Failure to Thrive, Adult Failure to thrive is a group of problems. These problems include eating too little and losing weight. People who have this condition may do fewer and fewer activities over time. They may lose interest in being with friends or they may not want to eat or drink. HOME CARE  Take medicines only as told by your doctor.  Eat a healthy, well-balanced diet.  Make sure that you eat enough.  Be active. Do strength training. A physical therapist can help to set up an exercise program that fits you.  Make sure that you are safe at home.  Make sure that you have a plan for what to do if you cannot make decisions for yourself. GET HELP IF:  You are not able to eat well.  You are not able to move around.  You feel very sad.  You feel very hopeless. GET HELP RIGHT AWAY IF:  You think about ending your life.  You cannot eat or drink.  You do not get out of bed.  Staying at home is not safe.  You have a fever.   This information is not intended to replace advice given to you by your health care provider. Make sure you discuss any questions you have with your health care provider.   Document Released: 01/19/2011 Document Revised: 10/21/2014 Document Reviewed: 04/27/2014 Elsevier Interactive Patient Education [employer]

## 2015-03-07 NOTE — Progress Notes (Signed)
Occupational Therapy Treatment and Discharge Patient Details Name: Julia [name]. Brinker MRN: [id number] DOB: 1927/04/26 Today's Date: 03/07/2015    History of present illness Pt is a 82 y/o F admitted after having syncopal episode and fall as well as diarrhea and abdominal pain (r/o C diff).  Pt's PMH includes CAD, DMII, seizure disorder, HTN.     OT comments  This 82 yo female admitted with above presents to acute OT with goal met and son now aware that even though his mom may sound SOB her sats never dropped below 98% on RA with ambulation of 150 ft and then 100 feet. Her HR did go from 52 to 65 at highest during this and pt complained of being tired. Pt to D/C today so I am recommending HHOT follow up (along with HHPT, Aide, and RN) to make sure pt gets back to her PLOF at home. Acute OT will D/C pt.  Follow Up Recommendations  Home health OT;Supervision/Assistance - 24 hour    Equipment Recommendations   (recommendations give to pt's son)       Precautions / Restrictions Precautions Precautions: Fall Precaution Comments: pt not impulsive or agitated today Restrictions Weight Bearing Restrictions: No       Mobility Bed Mobility Overal bed mobility: Modified Independent Bed Mobility: Supine to Sit     Supine to sit: Modified independent (Device/Increase time);HOB elevated        Transfers Overall transfer level: Needs assistance Equipment used: Rolling walker (2 wheeled) Transfers: Sit to/from Stand Sit to Stand: Min guard         General transfer comment: Ambulated first 150 feet then additional 100 feet with min guard A and RW sounding slightly short of breath but definitely more fatigued than she was when I saw her earlier and she ambulated    Balance Overall balance assessment: Needs assistance Sitting-balance support: No upper extremity supported;Feet supported Sitting balance-Leahy Scale: Fair     Standing balance support: Bilateral upper extremity  supported;During functional activity Standing balance-Leahy Scale: Poor Standing balance comment: Reliant on RW                   ADL Overall ADL's : Needs assistance/impaired Eating/Feeding: Independent;Sitting Eating/Feeding Details (indicate cue type and reason): however pt states the sight of food makes her nauseated, she did take 3 sips of Glucernia but then was using her napkin to wipe the taste off of her tongue Grooming: Min guard;Standing;Wash/dry hands   Upper Body Bathing: Set up;Supervision/ safety;Sitting   Lower Body Bathing: Min guard;Sit to/from stand   Upper Body Dressing : Set up;Supervision/safety;Sitting   Lower Body Dressing: Min guard;Sit to/from stand   Toilet Transfer: Min guard;Ambulation;RW;Comfort height toilet;Grab bars   Toileting- Clothing Manipulation and Hygiene: Min guard;Sit to/from stand         General ADL Comments: In to see pt a second time to address son's concerns about O2 sats and ambulation as well as energy conservation ideas.O2 sats/HR at rest 99%/52 on RA; during ambulation on RA sats not lower than 98%/HR as high as 65 (pt did not c/o SOB but rather fatigue. I did explain to son and pt about purse lipped breathing if she did feel SOB and other recommendations from energy conservation handout  (especially if she is having trouble breating it is not good for her to submerge herself into a tub of hot bath water and then have to struggle to get out--would be better to take a shower seated on a  tub seat with cooler water). They may also want to consider a rollator walker for pt to have a seat she can sit on v. having to have to find a place to sit down when she gets tired. (she has only had her current walker 2 years so insurance will to help pay for a rollator). Also gave pt's son energy conservation handout for him to look at/read to see what may pertain to his mom                Cognition   Behavior During Therapy: WFL for tasks  assessed/performed Overall Cognitive Status: History of cognitive impairments - at baseline Area of Impairment: Safety/judgement;Problem solving          Safety/Judgement: Decreased awareness of safety   Problem Solving: Slow processing      Extremity/Trunk Assessment  Upper Extremity Assessment Upper Extremity Assessment: Generalized weakness                   Pertinent Vitals/ Pain       Pain Assessment: Faces Faces Pain Scale: Hurts little more Pain Location: back Pain Descriptors / Indicators: Aching;Sore Pain Intervention(s): Repositioned;Monitored during session  Home Living   Living Arrangements: Children Available Help at Discharge: Family (son who works 8-5 daily; but per pt and son neighbors and friends are with pt during the day most of the time) Type of Home: House Home Access: Ramped entrance     Home Layout: One level     Bathroom Shower/Tub: Tub/shower unit;Curtain Shower/tub characteristics: Architectural technologist: Standard     Home Equipment: Environmental consultant - 2 wheels          Prior Functioning/Environment Level of Independence: Independent with assistive device(s)        Comments: Per son and pt, pt was Independent PTA w/ all basic ADLs and some IADLs   Frequency Min 2X/week     Progress Toward Goals  OT Goals(current goals can now be found in the care plan section)  Progress towards OT goals: Goals met/education completed, patient discharged from OT  Acute Rehab OT Goals Patient Stated Goal: to go home OT Goal Formulation: With patient/family Time For Goal Achievement: 03/14/15 Potential to Achieve Goals: Good ADL Goals Additional ADL Goal #1: Pt and son will be aware of energy conservation stratgies that may be of benefit to pt.  Plan Discharge plan remains appropriate       End of Session Equipment Utilized During Treatment: Rolling walker   Activity Tolerance Patient limited by fatigue (increased fatigue even over earlier  session)   Patient Left in chair;with call bell/phone within reach;with chair alarm set;with family/visitor present   Nurse Communication  (Can we please get HHOT/PT/Aid/RN added to pt's D/C; made sure if was ok for pt to have a Glucernia since she was not eating any of her lunch)    Functional Assessment Tool Used: Clinical observation Functional Limitation: Self care Self Care Current Status (A5409): At least 1 percent but less than 20 percent impaired, limited or restricted Self Care Goal Status (W1191): At least 1 percent but less than 20 percent impaired, limited or restricted Self Care Discharge Status [phone]): At least 1 percent but less than 20 percent impaired, limited or restricted   Time: 1159-1223 OT Time Calculation (min): 24 min  Charges: OT G-codes **NOT FOR INPATIENT CLASS** Functional Assessment Tool Used: Clinical observation Functional Limitation: Self care Self Care Current Status (F6213): At least 1 percent but less than 20 percent  impaired, limited or restricted Self Care Goal Status [phone]): At least 1 percent but less than 20 percent impaired, limited or restricted Self Care Discharge Status [phone]): At least 1 percent but less than 20 percent impaired, limited or restricted OT General Charges $OT Visit: 1 Procedure OT Evaluation $OT Eval Moderate Complexity: 1 Procedure OT Treatments $Self Care/Home Management : 8-22 mins  Almon Register [phone] 03/07/2015, 1:25 PM

## 2015-03-07 NOTE — Evaluation (Signed)
Occupational Therapy Evaluation Patient Details Name: Julia [name]. Erck MRN: [id number] DOB: 1927-06-22 Today's Date: 03/07/2015    History of Present Illness Pt is a 82 y/o F admitted after having syncopal episode and fall as well as diarrhea and abdominal pain (r/o C diff).  Pt's PMH includes CAD, DMII, seizure disorder, HTN.     Clinical Impression   This 82 yo female admitted with above presents to acute OT with deficits below affecting what appears to be her PLOF of Mod I to S. She will benefit from one more session of OT to address sats with ambulation to educate son and to educate pt and son on energy conservation. Recommend follow up HHOT with 24 hour S/prn A.    Follow Up Recommendations  Home health OT;Supervision/Assistance - 24 hour    Equipment Recommendations  None recommended by OT       Precautions / Restrictions Precautions Precautions: Fall Precaution Comments: pt not impulsive or agitated today Restrictions Weight Bearing Restrictions: No      Mobility Bed Mobility Overal bed mobility: Modified Independent Bed Mobility: Supine to Sit     Supine to sit: Modified independent (Device/Increase time);HOB elevated        Transfers Overall transfer level: Needs assistance Equipment used: Rolling walker (2 wheeled) Transfers: Sit to/from Stand Sit to Stand: Min guard         General transfer comment: Ambulated 150 feet with mn guard A and RW sounding slighlty short of breath    Balance Overall balance assessment: History of Falls;Needs assistance Sitting-balance support: No upper extremity supported;Feet supported Sitting balance-Leahy Scale: Fair     Standing balance support: Bilateral upper extremity supported;During functional activity Standing balance-Leahy Scale: Poor Standing balance comment: Reliant on use of one hand on RW or other surface when up on her feet                            ADL Overall ADL's : Needs  assistance/impaired Eating/Feeding: Independent;Sitting Eating/Feeding Details (indicate cue type and reason): however pt states the sight of food makes her nauseated, she did take 3 sips of Glucernia but then was using her napkin to wipe the taste off of her tongue Grooming: Min guard;Standing;Wash/dry hands   Upper Body Bathing: Set up;Supervision/ safety;Sitting   Lower Body Bathing: Min guard;Sit to/from stand   Upper Body Dressing : Set up;Supervision/safety;Sitting   Lower Body Dressing: Min guard;Sit to/from stand   Toilet Transfer: Min guard;Ambulation;RW;Comfort height toilet;Grab bars   Toileting- Clothing Manipulation and Hygiene: Min guard;Sit to/from stand         General ADL Comments: Son concerned about pt's sounding SOB at times and that when she is like this and goes to the Dr's office by the time they have checked her O2 sats she has had a chance to rest and her sats are always high 90's. He would like to have them checked while ambulating which I agreed to --but will have to come back later to do due to pt is being cooperative now and nursing needs to try and get her to take her meds               Pertinent Vitals/Pain Pain Assessment: Faces Faces Pain Scale: Hurts little more Pain Location: back Pain Descriptors / Indicators: Aching;Sore Pain Intervention(s): Monitored during session;Repositioned     Hand Dominance Right   Extremity/Trunk Assessment Upper Extremity Assessment Upper Extremity Assessment: Generalized weakness  Communication Communication Communication: No difficulties   Cognition Arousal/Alertness: Awake/alert Behavior During Therapy: WFL for tasks assessed/performed Overall Cognitive Status: History of cognitive impairments - at baseline Area of Impairment: Safety/judgement;Problem solving         Safety/Judgement: Decreased awareness of safety   Problem Solving: Slow processing                Home Living    Living Arrangements: Children Available Help at Discharge: Family (son who works 8-5 daily; but per pt and son neighbors and friends are with pt during the day most of the time) Type of Home: House Home Access: Ramped entrance     Home Layout: One level     Bathroom Shower/Tub: Tub/shower unit;Curtain Shower/tub characteristics: Engineer, building services: Standard     Home Equipment: Environmental consultant - 2 wheels          Prior Functioning/Environment Level of Independence: Independent with assistive device(s)        Comments: Per son and pt, pt was Independent PTA w/ all basic ADLs and some IADLs    OT Diagnosis: Generalized weakness   OT Problem List: Decreased strength;Decreased activity tolerance;Impaired balance (sitting and/or standing)   OT Treatment/Interventions: Self-care/ADL training;Patient/family education;Energy conservation    OT Goals(Current goals can be found in the care plan section) Acute Rehab OT Goals Patient Stated Goal: to go home OT Goal Formulation: With patient/family Time For Goal Achievement: 03/14/15 Potential to Achieve Goals: Good  OT Frequency: Min 2X/week              End of Session Equipment Utilized During Treatment: Gait belt;Rolling walker Nurse Communication:  (Can we please get HHOT/PT/Aid/RN added to pt's D/C; made sure if was ok for pt to have a Glucernia since she was not eating any of her lunch)  Activity Tolerance: Patient limited by fatigue (ambulating 150 feet and toileting wore her out) Patient left: in chair;with call bell/phone within reach;with chair alarm set;with family/visitor present;with nursing/sitter in room   Time: 6962-9528 OT Time Calculation (min): 37 min Charges:  OT General Charges $OT Visit: 1 Procedure OT Evaluation $OT Eval Moderate Complexity: 1 Procedure OT Treatments $Self Care/Home Management : 8-22 mins G-Codes: OT G-codes **NOT FOR INPATIENT CLASS** Functional Assessment Tool Used: Clinical  observation Functional Limitation: Self care Self Care Current Status (U1324): At least 1 percent but less than 20 percent impaired, limited or restricted Self Care Goal Status (M0102): At least 1 percent but less than 20 percent impaired, limited or restricted  Evette Georges [phone] 03/07/2015, 12:52 PM

## 2015-03-07 NOTE — Discharge Summary (Addendum)
Physician Discharge Summary  Julia [name]. Julia [name] [name] DOB: 04-30-1927 DOA: 03/05/2015  PCP: Julia Meo, MD  Admit date: 03/05/2015 Discharge date: 03/07/2015  Time spent: 30 minutes  Recommendations for Outpatient Follow-up:  1. Needs BMET 1-2 weeks 2. meds have been down-ward adjusted 3. Things may need to be reassessed by PCP 4. rec goals of care and ? Work-up parkinsons  Discharge Diagnoses:  Principal Problem:   Syncope Active Problems:   CKD (chronic kidney disease) stage 2, GFR 60-89 ml/min   Hyperlipidemia   CAD (coronary artery disease), severe single vessel LAD disease, nl. EF   Shortness of breath on exertion   Essential hypertension   Chest pain   Discharge Condition: gaurded  Diet recommendation: diabetic  Filed Weights   03/05/15 2258 03/06/15 0600 03/07/15 0358  Weight: 32.3 kg (71 lb 3.3 oz) 33.2 kg (73 lb 3.1 oz) 32.4 kg (71 lb 6.9 oz)    History of present illness:  82 y/o ? CAD svere single vessle LAD medically menaged HTN TyII DM Cervical spondylosis symtpomatic bradyacardia documented Prior HTN emergency Prior Dizzyness and "blacking out" 01/2012-Ct head chr disease  82 y/o ? CAD svere single with passing out episode-having abdominal pain and had an episode of diarrhea this a.m. Nursing reports very tearful and somewhat unintelligible  She was obs'd overnight on tele Noted her Hr was in the 50's so multiple changes to medications made.   She had some loose stools    She was hemodynamically stable  Her potassium was corrected  Long discussion with son about her decline, unwillingness to do some types of therapy and take insulin  We have simplified her meds  As OP rec Goals of Care with pcpc    Discharge Exam: Filed Vitals:   03/07/15 0120 03/07/15 0358  BP: 170/61 159/54  Pulse: 65 58  Temp: 98.2 F (36.8 C) 98.8 F (37.1 C)  Resp: 20 18    General: eomi ncat agitated Cardiovascular:  s1 s 2no  m/r/g Respiratory: clear no added sound  Discharge Instructions    Current Discharge Medication List    CONTINUE these medications which have CHANGED   Details  torsemide (DEMADEX) 20 MG tablet Take 1 tablet (20 mg total) by mouth every other day.      CONTINUE these medications which have NOT CHANGED   Details  aspirin EC 81 MG tablet Take 81 mg by mouth daily.    atorvastatin (LIPITOR) 20 MG tablet TAKE 1 TABLET BY MOUTH DAILY AS DIRECTED Qty: 90 tablet, Refills: 2    glimepiride (AMARYL) 4 MG tablet Take 4 mg by mouth daily with breakfast.    isosorbide mononitrate (IMDUR) 60 MG 24 hr tablet TAKE 1 TABLET BY MOUTH EVERY MORNING AND TAKE 1/2 TABLET BY MOUTH EVERY EVENING Qty: 45 tablet, Refills: 9    levETIRAcetam (KEPPRA) 750 MG tablet Take 1 tablet (750 mg total) by mouth 2 (two) times daily. Qty: 60 tablet, Refills: 11    Multiple Vitamin (MULTIVITAMIN WITH MINERALS) TABS tablet Take 1 tablet by mouth daily.    NITROSTAT 0.4 MG SL tablet DISSOLVE 1 TABLET UNDER THE TONGUE EVERY 5 MINUTES AS NEEDED FOR CHEST FOR PAIN AS DIRECTED Qty: 25 tablet, Refills: 3    omeprazole (PRILOSEC) 20 MG capsule Take 20 mg by mouth daily as needed (heartburn).     polyethylene glycol powder (GLYCOLAX/MIRALAX) powder TAKE 6 CAPFULS OF MIRALAX IN A 32 OUNCE GATORADE AND DRINK THE WHOLE BEVERAGE FOLLOWED BY 3 CAPFULS TWICE A  DAY FOR THE NEXT WEEK AND FOLLOW UP WITH YOUR PRIMARY CARE PHYSICIAN. Qty: 850 g, Refills: 0    ranolazine (RANEXA) 500 MG 12 hr tablet Take 1 tablet (500 mg total) by mouth 3 (three) times daily. Qty: 28 tablet, Refills: 0    traMADol (ULTRAM) 50 MG tablet Take 25-50 mg by mouth every 4 (four) hours as needed for moderate pain.  Refills: 5      STOP taking these medications     lisinopril (PRINIVIL,ZESTRIL) 10 MG tablet      metoprolol tartrate (LOPRESSOR) 25 MG tablet        No Known Allergies    The results of significant diagnostics from this  hospitalization (including imaging, microbiology, ancillary and laboratory) are listed below for reference.    Significant Diagnostic Studies: Dg Chest 2 View  03/05/2015  CLINICAL DATA:  Intermittent chest pain and back pain for several months EXAM: CHEST  2 VIEW COMPARISON:  05/28/2012 FINDINGS: Cardiomediastinal silhouette is stable. No acute infiltrate or pleural effusion. No pulmonary edema. Atherosclerotic calcifications of thoracic aorta. Mild degenerative changes lower thoracic spine. Diffuse osteopenia. Stable hyperinflation and chronic mild interstitial prominence. IMPRESSION: No active cardiopulmonary disease. Stable hyperinflation and chronic mild interstitial prominence. Electronically Signed   By: Natasha Mead M.D.   On: 03/05/2015 20:01   Ct Head Wo Contrast  03/05/2015  CLINICAL DATA:  Acute onset of difficulty breathing. Dizziness when standing up. Initial encounter. EXAM: CT HEAD WITHOUT CONTRAST TECHNIQUE: Contiguous axial images were obtained from the base of the skull through the vertex without intravenous contrast. COMPARISON:  CTA of the head performed 05/28/2012, and MRI of the brain performed 05/29/2012 FINDINGS: There is no evidence of acute infarction, mass lesion, or intra- or extra-axial hemorrhage on CT. Prominence of the ventricles and sulci reflects mild to moderate cortical volume loss. Mild cerebellar atrophy is noted. Scattered periventricular and subcortical white matter change likely reflects small vessel ischemic microangiopathy. Small chronic lacunar infarcts are noted in the right basal ganglia. The brainstem and fourth ventricle are within normal limits. The cerebral hemispheres demonstrate grossly normal gray-white differentiation. No mass effect or midline shift is seen. There is no evidence of fracture; visualized osseous structures are unremarkable in appearance. The orbits are within normal limits. The paranasal sinuses and mastoid air cells are well-aerated. No  significant soft tissue abnormalities are seen. IMPRESSION: 1. No acute intracranial pathology seen on CT. 2. Mild to moderate cortical volume loss and scattered small vessel ischemic microangiopathy. 3. Small chronic lacunar infarcts at the right basal ganglia. Electronically Signed   By: Roanna Raider M.D.   On: 03/05/2015 21:54    Microbiology: Recent Results (from the past 240 hour(s))  Urine culture     Status: None (Preliminary result)   Collection Time: 03/05/15 10:18 PM  Result Value Ref Range Status   Specimen Description URINE, CATHETERIZED  Final   Special Requests NONE  Final   Culture NO GROWTH < 24 HOURS  Final   Report Status PENDING  Incomplete     Labs: Basic Metabolic Panel:  Recent Labs Lab 03/05/15 1935 03/06/15 0457 03/07/15 0645  NA 139 143 143  K 2.8* 4.5 4.0  CL 96* 107 110  CO2 32 30 28  GLUCOSE 325* 177* 99  BUN CREATININE 0.87 0.74 0.78  CALCIUM 9.7 9.0 9.2  MG  --  1.7  --    Liver Function Tests:  Recent Labs Lab 03/06/15 0457  AST 16  ALT 13*  ALKPHOS 50  BILITOT 1.2  PROT 4.7*  ALBUMIN 2.6*   No results for input(s): LIPASE, AMYLASE in the last 168 hours. No results for input(s): AMMONIA in the last 168 hours. CBC:  Recent Labs Lab 03/05/15 1935 03/06/15 0457  WBC 4.9 6.2  HGB 11.7* 11.7*  HCT 33.4* 32.9*  MCV 85.9 87.0  PLT 176 160   Cardiac Enzymes:  Recent Labs Lab 03/05/15 2318 03/06/15 0457 03/06/15 1924  CKTOTAL 41  --   --   TROPONINI 0.03 <0.03 <0.03   BNP: BNP (last 3 results) No results for input(s): BNP in the last 8760 hours.  ProBNP (last 3 results) No results for input(s): PROBNP in the last 8760 hours.  CBG:  Recent Labs Lab 03/05/15 2302 03/06/15 0749 03/06/15 2022 03/07/15 0736  GLUCAP 186* 149* 99 95       Signed:  Rhetta Mura MD   [facility] 03/07/2015, 9:34 AM

## 2015-03-08 LAB — HEMOGLOBIN A1C
HEMOGLOBIN A1C: 10 % — AB (ref 4.8–5.6)
MEAN PLASMA GLUCOSE: 240 mg/dL

## 2015-03-12 ENCOUNTER — Emergency Department (HOSPITAL_COMMUNITY): Payer: Medicare Other

## 2015-03-12 ENCOUNTER — Emergency Department (HOSPITAL_COMMUNITY)
Admission: EM | Admit: 2015-03-12 | Discharge: 2015-03-12 | Disposition: A | Discharge status: Home / Self Care | Payer: Medicare Other | Attending: Emergency Medicine | Admitting: Emergency Medicine

## 2015-03-12 ENCOUNTER — Encounter (HOSPITAL_COMMUNITY): Payer: Self-pay | Admitting: *Deleted

## 2015-03-12 DIAGNOSIS — Z7982 Long term (current) use of aspirin: Secondary | ICD-10-CM | POA: Diagnosis not present

## 2015-03-12 DIAGNOSIS — Z79899 Other long term (current) drug therapy: Secondary | ICD-10-CM | POA: Insufficient documentation

## 2015-03-12 DIAGNOSIS — E785 Hyperlipidemia, unspecified: Secondary | ICD-10-CM | POA: Diagnosis not present

## 2015-03-12 DIAGNOSIS — I252 Old myocardial infarction: Secondary | ICD-10-CM | POA: Diagnosis not present

## 2015-03-12 DIAGNOSIS — N182 Chronic kidney disease, stage 2 (mild): Secondary | ICD-10-CM | POA: Diagnosis not present

## 2015-03-12 DIAGNOSIS — Z9861 Coronary angioplasty status: Secondary | ICD-10-CM | POA: Insufficient documentation

## 2015-03-12 DIAGNOSIS — M1990 Unspecified osteoarthritis, unspecified site: Secondary | ICD-10-CM | POA: Insufficient documentation

## 2015-03-12 DIAGNOSIS — Z9889 Other specified postprocedural states: Secondary | ICD-10-CM | POA: Diagnosis not present

## 2015-03-12 DIAGNOSIS — E119 Type 2 diabetes mellitus without complications: Secondary | ICD-10-CM | POA: Insufficient documentation

## 2015-03-12 DIAGNOSIS — F0390 Unspecified dementia without behavioral disturbance: Secondary | ICD-10-CM | POA: Insufficient documentation

## 2015-03-12 DIAGNOSIS — Z8673 Personal history of transient ischemic attack (TIA), and cerebral infarction without residual deficits: Secondary | ICD-10-CM | POA: Insufficient documentation

## 2015-03-12 DIAGNOSIS — I509 Heart failure, unspecified: Secondary | ICD-10-CM | POA: Insufficient documentation

## 2015-03-12 DIAGNOSIS — Z7984 Long term (current) use of oral hypoglycemic drugs: Secondary | ICD-10-CM | POA: Diagnosis not present

## 2015-03-12 DIAGNOSIS — I2510 Atherosclerotic heart disease of native coronary artery without angina pectoris: Secondary | ICD-10-CM | POA: Diagnosis not present

## 2015-03-12 DIAGNOSIS — I129 Hypertensive chronic kidney disease with stage 1 through stage 4 chronic kidney disease, or unspecified chronic kidney disease: Secondary | ICD-10-CM | POA: Diagnosis not present

## 2015-03-12 DIAGNOSIS — K219 Gastro-esophageal reflux disease without esophagitis: Secondary | ICD-10-CM | POA: Insufficient documentation

## 2015-03-12 DIAGNOSIS — R0602 Shortness of breath: Secondary | ICD-10-CM | POA: Diagnosis present

## 2015-03-12 LAB — BASIC METABOLIC PANEL
Anion gap: 9 (ref 5–15)
BUN: 6 mg/dL (ref 6–20)
CHLORIDE: 99 mmol/L — AB (ref 101–111)
CO2: 30 mmol/L (ref 22–32)
CREATININE: 0.89 mg/dL (ref 0.44–1.00)
Calcium: 9.3 mg/dL (ref 8.9–10.3)
GFR calc non Af Amer: 57 mL/min — ABNORMAL LOW (ref >60)
Glucose, Bld: 163 mg/dL — ABNORMAL HIGH (ref 65–99)
POTASSIUM: 4 mmol/L (ref 3.5–5.1)
SODIUM: 138 mmol/L (ref 135–145)

## 2015-03-12 LAB — I-STAT TROPONIN, ED: Troponin i, poc: 0.02 ng/mL (ref 0.00–0.08)

## 2015-03-12 LAB — CBC
HEMATOCRIT: 32.2 % — AB (ref 36.0–46.0)
Hemoglobin: 10.9 g/dL — ABNORMAL LOW (ref 12.0–15.0)
MCH: 29.9 pg (ref 26.0–34.0)
MCHC: 33.9 g/dL (ref 30.0–36.0)
MCV: 88.2 fL (ref 78.0–100.0)
Platelets: 174 10*3/uL (ref 150–400)
RBC: 3.65 MIL/uL — AB (ref 3.87–5.11)
RDW: 12.7 % (ref 11.5–15.5)
WBC: 4.8 10*3/uL (ref 4.0–10.5)

## 2015-03-12 LAB — BRAIN NATRIURETIC PEPTIDE: B NATRIURETIC PEPTIDE 5: 596.2 pg/mL — AB (ref 0.0–100.0)

## 2015-03-12 MED ORDER — FUROSEMIDE 10 MG/ML IJ SOLN
40.0000 mg | INTRAMUSCULAR | Status: AC
  Administered 2015-03-12: 40 mg via INTRAVENOUS
  Filled 2015-03-12: qty 4

## 2015-03-12 NOTE — ED Notes (Signed)
Pt back from x-ray.

## 2015-03-12 NOTE — ED Notes (Signed)
Pt. Brought back to Room 13 and Transport arrived to take pt. To X-ray.

## 2015-03-12 NOTE — ED Notes (Signed)
EDP speaking with family 

## 2015-03-12 NOTE — Discharge Instructions (Signed)
Take torsemide 1 dose daily until follow up with primary care provider.   Heart Failure Heart failure is a condition in which the heart has trouble pumping blood. This means your heart does not pump blood efficiently for your body to work well. In some cases of heart failure, fluid may back up into your lungs or you may have swelling (edema) in your lower legs. Heart failure is usually a long-term (chronic) condition. It is important for you to take good care of yourself and follow your health care provider's treatment plan. CAUSES  Some health conditions can cause heart failure. Those health conditions include:  High blood pressure (hypertension). Hypertension causes the heart muscle to work harder than normal. When pressure in the blood vessels is high, the heart needs to pump (contract) with more force in order to circulate blood throughout the body. High blood pressure eventually causes the heart to become stiff and weak.  Coronary artery disease (CAD). CAD is the buildup of cholesterol and fat (plaque) in the arteries of the heart. The blockage in the arteries deprives the heart muscle of oxygen and blood. This can cause chest pain and may lead to a heart attack. High blood pressure can also contribute to CAD.  Heart attack (myocardial infarction). A heart attack occurs when one or more arteries in the heart become blocked. The loss of oxygen damages the muscle tissue of the heart. When this happens, part of the heart muscle dies. The injured tissue does not contract as well and weakens the heart's ability to pump blood.  Abnormal heart valves. When the heart valves do not open and close properly, it can cause heart failure. This makes the heart muscle pump harder to keep the blood flowing.  Heart muscle disease (cardiomyopathy or myocarditis). Heart muscle disease is damage to the heart muscle from a variety of causes. These can include drug or alcohol abuse, infections, or unknown reasons.  These can increase the risk of heart failure.  Lung disease. Lung disease makes the heart work harder because the lungs do not work properly. This can cause a strain on the heart, leading it to fail.  Diabetes. Diabetes increases the risk of heart failure. High blood sugar contributes to high fat (lipid) levels in the blood. Diabetes can also cause slow damage to tiny blood vessels that carry important nutrients to the heart muscle. When the heart does not get enough oxygen and food, it can cause the heart to become weak and stiff. This leads to a heart that does not contract efficiently.  Other conditions can contribute to heart failure. These include abnormal heart rhythms, thyroid problems, and low blood counts (anemia). Certain unhealthy behaviors can increase the risk of heart failure, including:  Being overweight.  Smoking or chewing tobacco.  Eating foods high in fat and cholesterol.  Abusing illicit drugs or alcohol.  Lacking physical activity. SYMPTOMS  Heart failure symptoms may vary and can be hard to detect. Symptoms may include:  Shortness of breath with activity, such as climbing stairs.  Persistent cough.  Swelling of the feet, ankles, legs, or abdomen.  Unexplained weight gain.  Difficulty breathing when lying flat (orthopnea).  Waking from sleep because of the need to sit up and get more air.  Rapid heartbeat.  Fatigue and loss of energy.  Feeling light-headed, dizzy, or close to fainting.  Loss of appetite.  Nausea.  Increased urination during the night (nocturia). DIAGNOSIS  A diagnosis of heart failure is based on your history,  symptoms, physical examination, and diagnostic tests. Diagnostic tests for heart failure may include:  Echocardiography.  Electrocardiography.  Chest X-ray.  Blood tests.  Exercise stress test.  Cardiac angiography.  Radionuclide scans. TREATMENT  Treatment is aimed at managing the symptoms of heart failure.  Medicines, behavioral changes, or surgical intervention may be necessary to treat heart failure.  Medicines to help treat heart failure may include:  Angiotensin-converting enzyme (ACE) inhibitors. This type of medicine blocks the effects of a blood protein called angiotensin-converting enzyme. ACE inhibitors relax (dilate) the blood vessels and help lower blood pressure.  Angiotensin receptor blockers (ARBs). This type of medicine blocks the actions of a blood protein called angiotensin. Angiotensin receptor blockers dilate the blood vessels and help lower blood pressure.  Water pills (diuretics). Diuretics cause the kidneys to remove salt and water from the blood. The extra fluid is removed through urination. This loss of extra fluid lowers the volume of blood the heart pumps.  Beta blockers. These prevent the heart from beating too fast and improve heart muscle strength.  Digitalis. This increases the force of the heartbeat.  Healthy behavior changes include:  Obtaining and maintaining a healthy weight.  Stopping smoking or chewing tobacco.  Eating heart-healthy foods.  Limiting or avoiding alcohol.  Stopping illicit drug use.  Physical activity as directed by your health care provider.  Surgical treatment for heart failure may include:  A procedure to open blocked arteries, repair damaged heart valves, or remove damaged heart muscle tissue.  A pacemaker to improve heart muscle function and control certain abnormal heart rhythms.  An internal cardioverter defibrillator to treat certain serious abnormal heart rhythms.  A left ventricular assist device (LVAD) to assist the pumping ability of the heart. HOME CARE INSTRUCTIONS   Take medicines only as directed by your health care provider. Medicines are important in reducing the workload of your heart, slowing the progression of heart failure, and improving your symptoms.  Do not stop taking your medicine unless directed by  your health care provider.  Do not skip any dose of medicine.  Refill your prescriptions before you run out of medicine. Your medicines are needed every day.  Engage in moderate physical activity if directed by your health care provider. Moderate physical activity can benefit some people. The elderly and people with severe heart failure should consult with a health care provider for physical activity recommendations.  Eat heart-healthy foods. Food choices should be free of trans fat and low in saturated fat, cholesterol, and salt (sodium). Healthy choices include fresh or frozen fruits and vegetables, fish, lean meats, legumes, fat-free or low-fat dairy products, and whole grain or high fiber foods. Talk to a dietitian to learn more about heart-healthy foods.  Limit sodium if directed by your health care provider. Sodium restriction may reduce symptoms of heart failure in some people. Talk to a dietitian to learn more about heart-healthy seasonings.  Use healthy cooking methods. Healthy cooking methods include roasting, grilling, broiling, baking, poaching, steaming, or stir-frying. Talk to a dietitian to learn more about healthy cooking methods.  Limit fluids if directed by your health care provider. Fluid restriction may reduce symptoms of heart failure in some people.  Weigh yourself every day. Daily weights are important in the early recognition of excess fluid. You should weigh yourself every morning after you urinate and before you eat breakfast. Wear the same amount of clothing each time you weigh yourself. Record your daily weight. Provide your health care provider with your weight  record.  Monitor and record your blood pressure if directed by your health care provider.  Check your pulse if directed by your health care provider.  Lose weight if directed by your health care provider. Weight loss may reduce symptoms of heart failure in some people.  Stop smoking or chewing tobacco.  Nicotine makes your heart work harder by causing your blood vessels to constrict. Do not use nicotine gum or patches before talking to your health care provider.  Keep all follow-up visits as directed by your health care provider. This is important.  Limit alcohol intake to no more than 1 drink per day for nonpregnant women and 2 drinks per day for men. One drink equals 12 ounces of beer, 5 ounces of wine, or 1 ounces of hard liquor. Drinking more than that is harmful to your heart. Tell your health care provider if you drink alcohol several times a week. Talk with your health care provider about whether alcohol is safe for you. If your heart has already been damaged by alcohol or you have severe heart failure, drinking alcohol should be stopped completely.  Stop illicit drug use.  Stay up-to-date with immunizations. It is especially important to prevent respiratory infections through current pneumococcal and influenza immunizations.  Manage other health conditions such as hypertension, diabetes, thyroid disease, or abnormal heart rhythms as directed by your health care provider.  Learn to manage stress.  Plan rest periods when fatigued.  Learn strategies to manage high temperatures. If the weather is extremely hot:  Avoid vigorous physical activity.  Use air conditioning or fans or seek a cooler location.  Avoid caffeine and alcohol.  Wear loose-fitting, lightweight, and light-colored clothing.  Learn strategies to manage cold temperatures. If the weather is extremely cold:  Avoid vigorous physical activity.  Layer clothes.  Wear mittens or gloves, a hat, and a scarf when going outside.  Avoid alcohol.  Obtain ongoing education and support as needed.  Participate in or seek rehabilitation as needed to maintain or improve independence and quality of life. SEEK MEDICAL CARE IF:   You have a rapid weight gain.  You have increasing shortness of breath that is unusual for  you.  You are unable to participate in your usual physical activities.  You tire easily.  You cough more than normal, especially with physical activity.  You have any or more swelling in areas such as your hands, feet, ankles, or abdomen.  You are unable to sleep because it is hard to breathe.  You feel like your heart is beating fast (palpitations).  You become dizzy or light-headed upon standing up. SEEK IMMEDIATE MEDICAL CARE IF:   You have difficulty breathing.  There is a change in mental status such as decreased alertness or difficulty with concentration.  You have a pain or discomfort in your chest.  You have an episode of fainting (syncope). MAKE SURE YOU:   Understand these instructions.  Will watch your condition.  Will get help right away if you are not doing well or get worse.   This information is not intended to replace advice given to you by your health care provider. Make sure you discuss any questions you have with your health care provider.   Document Released: 01/30/2005 Document Revised: 06/16/2014 Document Reviewed: 03/01/2012 Elsevier Interactive Patient Education [employer]

## 2015-03-12 NOTE — ED Notes (Signed)
Attempted to collect blood but it clotted, called Julia [name] phlebotomist to inform. Stated he would come draw

## 2015-03-12 NOTE — ED Notes (Signed)
Pt family member standing at doorway, stating that he would like an update, told him that we were waiting for 1 more blood test to come back. Asked it the pt would be admitted

## 2015-03-12 NOTE — ED Notes (Signed)
Pt reports ongoing sob, hx of chf. spo2 98% at triage, ekg done.

## 2015-03-12 NOTE — ED Notes (Signed)
EDP at bedside  

## 2015-03-12 NOTE — ED Provider Notes (Signed)
CSN: [id number]     Arrival date & time 03/12/15  1850 History   First MD Initiated Contact with Patient 03/12/15 1913     Chief Complaint  Patient presents with  . Shortness of Breath     (Consider location/radiation/quality/duration/timing/severity/associated sxs/prior Treatment) HPI Comments: 82yo F w/ PMH including CHF, CAD, CVA, memory loss, HTN, GERD who p/w shortness of breath. History obtained with the assistance of the patient's son because of the patient's memory loss. He reports that patient has an ongoing long history of shortness of breath which is intermittently worse. She has been very weak recently and states that she has difficulty holding her head up. Today, she told her son "I can't breathe" and told him that she needed to go to the hospital so he brought her here. No cough/cold symptoms, vomiting, or complaints of new pain. The patient has chronic pain which she states is "from head to toe."   Patient is a 82 y.o. female presenting with shortness of breath. The history is provided by the patient and a relative.  Shortness of Breath   Past Medical History  Diagnosis Date  . Hypertension   . GERD (gastroesophageal reflux disease)   . Edema leg 12/06/2011  . Hyperlipidemia 12/06/2011  . CAD (coronary artery disease), severe single vessel LAD disease, nl. EF 12/08/2011    Unsuccessful PCI  . Shortness of breath     "all the time since last Friday" (12/12/2011)  . Type II diabetes mellitus (HCC)   . CKD (chronic kidney disease) stage 2, GFR 60-89 ml/min 12/06/2011  . Stroke [facility])     "several small strokes"; denies residual  (12/12/2011)  . Arthritis     "all my joints" (12/12/2011)  . Anorexia   . Failure to thrive   . Memory loss   . Myocardial infarction (HCC)   . CHF (congestive heart failure) [facility])    Past Surgical History  Procedure Laterality Date  . Pseudoaneurysm repair  12/12/2011    "no surgery; did ultrasonic; right femoral compression"  .  Appendectomy    . Cholecystectomy    . Colectomy      "took out 27 1/2 inches" (12/12/2011)  . Lumbar disc surgery      "2 discs removed, spacer put in" (12/12/2011)  . Carpal tunnel release      bilaterally  . Hammer toe surgery      right  . Abdominal hysterectomy    . Back surgery    . Coronary angioplasty with stent placement  12/08/2011    "failed"  . Cardiac catheterization  12/07/2011  . Left heart catheterization with coronary angiogram N/A 12/07/2011    Procedure: LEFT HEART CATHETERIZATION WITH CORONARY ANGIOGRAM;  Surgeon: Marykay Lex, MD;  Location: [facility] CATH LAB;  Service: Cardiovascular;  Laterality: N/A;  . Percutaneous coronary stent intervention (pci-s) Bilateral 12/08/2011    Procedure: PERCUTANEOUS CORONARY STENT INTERVENTION (PCI-S);  Surgeon: [name], MD;  Location: [facility] CATH LAB;  Service: Cardiovascular;  Laterality: Bilateral;   Family History  Problem Relation Age of Onset  . Leukemia Brother   . Cancer Brother     ?  . Diabetes      Both Maternal and Paternal sides   Social History  Substance Use Topics  . Smoking status: Never Smoker   . Smokeless tobacco: Never Used  . Alcohol Use: No   OB History    No data available     Review of Systems  Unable to  perform ROS: Dementia  Respiratory: Positive for shortness of breath.       Allergies  Review of patient's allergies indicates no known allergies.  Home Medications   Prior to Admission medications   Medication Sig Start Date End Date Taking? Authorizing Provider  aspirin EC 81 MG tablet Take 81 mg by mouth daily.   Yes Historical Provider, MD  atorvastatin (LIPITOR) 20 MG tablet TAKE 1 TABLET BY MOUTH DAILY AS DIRECTED 09/24/14  Yes [name], MD  glimepiride (AMARYL) 4 MG tablet Take 4 mg by mouth daily with breakfast.   Yes Historical Provider, MD  isosorbide mononitrate (IMDUR) 60 MG 24 hr tablet TAKE 1 TABLET BY MOUTH EVERY MORNING AND TAKE 1/2 TABLET BY MOUTH EVERY  EVENING 06/15/14  Yes [name], MD  levETIRAcetam (KEPPRA) 750 MG tablet Take 1 tablet (750 mg total) by mouth 2 (two) times daily. 06/03/12  Yes Geoffry Paradise, MD  Multiple Vitamin (MULTIVITAMIN WITH MINERALS) TABS tablet Take 1 tablet by mouth daily.   Yes Historical Provider, MD  NITROSTAT 0.4 MG SL tablet DISSOLVE 1 TABLET UNDER THE TONGUE EVERY 5 MINUTES AS NEEDED FOR CHEST FOR PAIN AS DIRECTED 11/27/14  Yes [name], MD  omeprazole (PRILOSEC) 20 MG capsule Take 20 mg by mouth daily as needed (heartburn).    Yes Historical Provider, MD  polyethylene glycol powder (GLYCOLAX/MIRALAX) powder TAKE 6 CAPFULS OF MIRALAX IN A 32 OUNCE GATORADE AND DRINK THE WHOLE BEVERAGE FOLLOWED BY 3 CAPFULS TWICE A DAY FOR THE NEXT WEEK AND FOLLOW UP WITH YOUR PRIMARY CARE PHYSICIAN. Patient taking differently: Take 0.5 Containers by mouth every other day.  07/21/14  Yes Lyndal Pulley, MD  ranolazine (RANEXA) 500 MG 12 hr tablet Take 1 tablet (500 mg total) by mouth 3 (three) times daily. 02/25/15  Yes [name], MD  torsemide (DEMADEX) 20 MG tablet Take 1 tablet (20 mg total) by mouth every other day. 03/07/15  Yes Rhetta Mura, MD  traMADol (ULTRAM) 50 MG tablet Take 25-50 mg by mouth every 4 (four) hours as needed for moderate pain.  02/27/14  Yes Historical Provider, MD   BP 188/64 mmHg  Pulse 55  Resp 15  SpO2 95% Physical Exam  Constitutional: She is oriented to person, place, and time.  cachectic, frail elderly woman, resting tremor  HENT:  Head: Normocephalic and atraumatic.  Moist mucous membranes  Eyes: Conjunctivae are normal. Pupils are equal, round, and reactive to light.  Neck: Neck supple.  Cardiovascular: Normal rate, regular rhythm and normal heart sounds.   No murmur heard. Pulmonary/Chest: Effort normal and breath sounds normal.  Abdominal: Soft. Bowel sounds are normal. She exhibits no distension. There is no tenderness.  Musculoskeletal: She exhibits no edema.   Neurological: She is alert and oriented to person, place, and time.  Fluent speech  Skin: Skin is warm and dry.  Psychiatric: She has a normal mood and affect. Judgment normal.  Nursing note and vitals reviewed.   ED Course  Procedures (including critical care time) Labs Review Labs Reviewed  BASIC METABOLIC PANEL - Abnormal; Notable for the following:    Chloride 99 (*)    Glucose, Bld 163 (*)    GFR calc non Af Amer 57 (*)    All other components within normal limits  CBC - Abnormal; Notable for the following:    RBC 3.65 (*)    Hemoglobin 10.9 (*)    HCT 32.2 (*)    All other components  within normal limits  BRAIN NATRIURETIC PEPTIDE - Abnormal; Notable for the following:    B Natriuretic Peptide 596.2 (*)    All other components within normal limits  I-STAT TROPOININ, ED    Imaging Review Dg Chest 2 View  03/12/2015  CLINICAL DATA:  Shortness of breath and body pain all over. EXAM: CHEST  2 VIEW COMPARISON:  03/13/2015 FINDINGS: Lungs are hyperexpanded. The lungs are clear wiithout focal pneumonia, edema, pneumothorax or pleural effusion. Interstitial markings are diffusely coarsened with chronic features. Skin fold overlies the right lung base. Telemetry lead overlies the upper right lung. The cardio pericardial silhouette is enlarged. Bones are diffusely demineralized. IMPRESSION: Cardiomegaly with emphysema.  No acute cardiopulmonary process. Electronically Signed   By: Kennith Center M.D.   On: 03/12/2015 19:23   I have personally reviewed and evaluated these lab results as part of my medical decision-making.   EKG Interpretation   Date/Time:  Friday March 12 2015 19:00:48 EST Ventricular Rate:  55 PR Interval:  152 QRS Duration: 84 QT Interval:  438 QTC Calculation: 419 R Axis:   89 Text Interpretation:  Sinus bradycardia Minimal voltage criteria for LVH,  may be normal variant Anteroseptal infarct , age undetermined Abnormal ECG  No significant change since  last tracing Confirmed by Jannat Rosemeyer MD, Scotty Pinder  [phone]) on 03/12/2015 7:24:05 PM     Medications  furosemide (LASIX) injection 40 mg (40 mg Intravenous Given 03/12/15 2157)    MDM   Final diagnoses:  Acute on chronic congestive heart failure, unspecified congestive heart failure type (HCC)   Pt w/ h/o CHF p/w shortness of breath that is chronic but worse today. The patient was thin and frail on exam, chronically ill-appearing but in no acute distress. O2 sat stable on room air. She had normal work of breathing and no obvious wheezing. EKG unchanged from previous. Chest x-ray shows cardiomegaly with no acute process. Obtained above lab work which was notable only for BNP of 596. Son states that after recent hospitalization, the patient's home diuretics were tapered back to every other day. Given the chronicity of the patient's shortness of breath, I suspect CHF exacerbation.Gave the patient IV Lasix. I have extensively discussed options with family including risks and benefits of admission versus discharge home. As the patient is not hypoxic and is breathing comfortably, I feel she is eligible for outpatient treatment given her risk of nosocomial infection and her own desire to be at home. Instructed family to take diuretics once daily instead of once every other day and to follow-up with PCP this week for reevaluation and weight check. Family felt comfortable w/ this plan and patient discharged in satisfactory condition.   Laurence Spates, MD 03/13/15 [phone]

## 2015-03-19 ENCOUNTER — Telehealth: Payer: Self-pay | Admitting: *Deleted

## 2015-03-19 NOTE — Telephone Encounter (Signed)
PA for Ranexa sent via cover my meds.

## 2015-03-30 ENCOUNTER — Emergency Department (HOSPITAL_COMMUNITY)

## 2015-03-30 ENCOUNTER — Encounter (HOSPITAL_COMMUNITY): Payer: Self-pay

## 2015-03-30 ENCOUNTER — Emergency Department (HOSPITAL_COMMUNITY)
Admission: EM | Admit: 2015-03-30 | Discharge: 2015-03-30 | Disposition: A | Discharge status: Home / Self Care | Attending: Physician Assistant | Admitting: Physician Assistant

## 2015-03-30 DIAGNOSIS — I509 Heart failure, unspecified: Secondary | ICD-10-CM | POA: Diagnosis not present

## 2015-03-30 DIAGNOSIS — E785 Hyperlipidemia, unspecified: Secondary | ICD-10-CM | POA: Insufficient documentation

## 2015-03-30 DIAGNOSIS — F0390 Unspecified dementia without behavioral disturbance: Secondary | ICD-10-CM | POA: Diagnosis not present

## 2015-03-30 DIAGNOSIS — I2510 Atherosclerotic heart disease of native coronary artery without angina pectoris: Secondary | ICD-10-CM | POA: Diagnosis not present

## 2015-03-30 DIAGNOSIS — W1839XA Other fall on same level, initial encounter: Secondary | ICD-10-CM | POA: Diagnosis not present

## 2015-03-30 DIAGNOSIS — S50811A Abrasion of right forearm, initial encounter: Secondary | ICD-10-CM | POA: Insufficient documentation

## 2015-03-30 DIAGNOSIS — Z8739 Personal history of other diseases of the musculoskeletal system and connective tissue: Secondary | ICD-10-CM | POA: Insufficient documentation

## 2015-03-30 DIAGNOSIS — I252 Old myocardial infarction: Secondary | ICD-10-CM | POA: Diagnosis not present

## 2015-03-30 DIAGNOSIS — N182 Chronic kidney disease, stage 2 (mild): Secondary | ICD-10-CM | POA: Diagnosis not present

## 2015-03-30 DIAGNOSIS — Z8719 Personal history of other diseases of the digestive system: Secondary | ICD-10-CM | POA: Insufficient documentation

## 2015-03-30 DIAGNOSIS — Y998 Other external cause status: Secondary | ICD-10-CM | POA: Insufficient documentation

## 2015-03-30 DIAGNOSIS — I129 Hypertensive chronic kidney disease with stage 1 through stage 4 chronic kidney disease, or unspecified chronic kidney disease: Secondary | ICD-10-CM | POA: Diagnosis not present

## 2015-03-30 DIAGNOSIS — Y92128 Other place in nursing home as the place of occurrence of the external cause: Secondary | ICD-10-CM | POA: Insufficient documentation

## 2015-03-30 DIAGNOSIS — W19XXXA Unspecified fall, initial encounter: Secondary | ICD-10-CM

## 2015-03-30 DIAGNOSIS — E119 Type 2 diabetes mellitus without complications: Secondary | ICD-10-CM | POA: Diagnosis not present

## 2015-03-30 DIAGNOSIS — Y9389 Activity, other specified: Secondary | ICD-10-CM | POA: Diagnosis not present

## 2015-03-30 DIAGNOSIS — S0990XA Unspecified injury of head, initial encounter: Secondary | ICD-10-CM | POA: Diagnosis present

## 2015-03-30 DIAGNOSIS — S06360A Traumatic hemorrhage of cerebrum, unspecified, without loss of consciousness, initial encounter: Secondary | ICD-10-CM | POA: Diagnosis not present

## 2015-03-30 DIAGNOSIS — Z8673 Personal history of transient ischemic attack (TIA), and cerebral infarction without residual deficits: Secondary | ICD-10-CM | POA: Insufficient documentation

## 2015-03-30 LAB — COMPREHENSIVE METABOLIC PANEL
ALT: 17 U/L (ref 14–54)
ANION GAP: 9 (ref 5–15)
AST: 18 U/L (ref 15–41)
Albumin: 3.5 g/dL (ref 3.5–5.0)
Alkaline Phosphatase: 62 U/L (ref 38–126)
BILIRUBIN TOTAL: 1.1 mg/dL (ref 0.3–1.2)
BUN: <5 mg/dL — ABNORMAL LOW (ref 6–20)
CO2: 28 mmol/L (ref 22–32)
Calcium: 9.3 mg/dL (ref 8.9–10.3)
Chloride: 107 mmol/L (ref 101–111)
Creatinine, Ser: 0.63 mg/dL (ref 0.44–1.00)
GFR calc non Af Amer: >60 mL/min (ref >60)
GLUCOSE: 274 mg/dL — AB (ref 65–99)
POTASSIUM: 2.8 mmol/L — AB (ref 3.5–5.1)
Sodium: 144 mmol/L (ref 135–145)
TOTAL PROTEIN: 5.8 g/dL — AB (ref 6.5–8.1)

## 2015-03-30 LAB — URINALYSIS, ROUTINE W REFLEX MICROSCOPIC
Bilirubin Urine: NEGATIVE
KETONES UR: NEGATIVE mg/dL
LEUKOCYTES UA: NEGATIVE
NITRITE: NEGATIVE
PROTEIN: 100 mg/dL — AB
Specific Gravity, Urine: 1.011 (ref 1.005–1.030)
pH: 7.5 (ref 5.0–8.0)

## 2015-03-30 LAB — CBC WITH DIFFERENTIAL/PLATELET
Basophils Absolute: 0 10*3/uL (ref 0.0–0.1)
Basophils Relative: 0 %
EOS PCT: 1 %
Eosinophils Absolute: 0.1 10*3/uL (ref 0.0–0.7)
HEMATOCRIT: 34.5 % — AB (ref 36.0–46.0)
Hemoglobin: 12 g/dL (ref 12.0–15.0)
LYMPHS ABS: 0.7 10*3/uL (ref 0.7–4.0)
LYMPHS PCT: 12 %
MCH: 30.6 pg (ref 26.0–34.0)
MCHC: 34.8 g/dL (ref 30.0–36.0)
MCV: 88 fL (ref 78.0–100.0)
MONO ABS: 0.3 10*3/uL (ref 0.1–1.0)
MONOS PCT: 5 %
NEUTROS ABS: 4.7 10*3/uL (ref 1.7–7.7)
Neutrophils Relative %: 82 %
PLATELETS: 164 10*3/uL (ref 150–400)
RBC: 3.92 MIL/uL (ref 3.87–5.11)
RDW: 12.8 % (ref 11.5–15.5)
WBC: 5.7 10*3/uL (ref 4.0–10.5)

## 2015-03-30 LAB — PROTIME-INR
INR: 1.05 (ref 0.00–1.49)
Prothrombin Time: 13.9 seconds (ref 11.6–15.2)

## 2015-03-30 LAB — URINE MICROSCOPIC-ADD ON

## 2015-03-30 MED ORDER — ACETAMINOPHEN 325 MG PO TABS
650.0000 mg | ORAL_TABLET | Freq: Once | ORAL | Status: DC
  Filled 2015-03-30: qty 2

## 2015-03-30 MED ORDER — POTASSIUM CHLORIDE 20 MEQ/15ML (10%) PO SOLN
40.0000 meq | Freq: Once | ORAL | Status: AC
  Administered 2015-03-30: 40 meq via ORAL
  Filled 2015-03-30: qty 30

## 2015-03-30 MED ORDER — ACETAMINOPHEN 160 MG/5ML PO SOLN
650.0000 mg | Freq: Once | ORAL | Status: AC
  Administered 2015-03-30: 650 mg via ORAL
  Filled 2015-03-30: qty 20.3

## 2015-03-30 NOTE — Progress Notes (Signed)
[facility] ED Room 5-[facility]-RN Visit  Patient seen in ED with son at bedside.  Patient has an OOF DNR. Patient lethargic and oriented to person only. Discharge disposition undetermined at this time.  Patient c/o pain "all over."  According to son, patient has had multiple falls lately.  He said he found her on the ground with the walker on top of her.  Offered emotional support and left contact information for son.  He denied any needs at this time.  Alerted RN of patient's c/o pain.  Updated medication list provided.  [facility] will continue to follow.  Please call with any questions.  Thank you, Hessie Knows RN, BSN [facility] Liaison [phone]

## 2015-03-30 NOTE — ED Notes (Signed)
Patient transported to X-ray 

## 2015-03-30 NOTE — ED Provider Notes (Signed)
CSN: [id number]     Arrival date & time 03/30/15  1442 History   First MD Initiated Contact with Patient 03/30/15 1502     Chief Complaint  Patient presents with  . Fall     (Consider location/radiation/quality/duration/timing/severity/associated sxs/prior Treatment) HPI\  Patient is an 82 year old female past medical history significant for CHF, CAD, CVA, dementia, hypertension and on hospice currently presenting today after fall. Patient reportedly had a fall earlier today found by hospice. Then she was found again by her son. She complained of pain all over.  Son notes no external signs of trauma.  On arrival patient does not complain of any pain at the time.  Level V caveat dementia.  Past Medical History  Diagnosis Date  . Hypertension   . GERD (gastroesophageal reflux disease)   . Edema leg 12/06/2011  . Hyperlipidemia 12/06/2011  . CAD (coronary artery disease), severe single vessel LAD disease, nl. EF 12/08/2011    Unsuccessful PCI  . Shortness of breath     "all the time since last Friday" (12/12/2011)  . Type II diabetes mellitus (HCC)   . CKD (chronic kidney disease) stage 2, GFR 60-89 ml/min 12/06/2011  . Stroke [facility])     "several small strokes"; denies residual  (12/12/2011)  . Arthritis     "all my joints" (12/12/2011)  . Anorexia   . Failure to thrive   . Memory loss   . Myocardial infarction (HCC)   . CHF (congestive heart failure) [facility])    Past Surgical History  Procedure Laterality Date  . Pseudoaneurysm repair  12/12/2011    "no surgery; did ultrasonic; right femoral compression"  . Appendectomy    . Cholecystectomy    . Colectomy      "took out 27 1/2 inches" (12/12/2011)  . Lumbar disc surgery      "2 discs removed, spacer put in" (12/12/2011)  . Carpal tunnel release      bilaterally  . Hammer toe surgery      right  . Abdominal hysterectomy    . Back surgery    . Coronary angioplasty with stent placement  12/08/2011    "failed"  .  Cardiac catheterization  12/07/2011  . Left heart catheterization with coronary angiogram N/A 12/07/2011    Procedure: LEFT HEART CATHETERIZATION WITH CORONARY ANGIOGRAM;  Surgeon: Marykay Lex, MD;  Location: [facility] CATH LAB;  Service: Cardiovascular;  Laterality: N/A;  . Percutaneous coronary stent intervention (pci-s) Bilateral 12/08/2011    Procedure: PERCUTANEOUS CORONARY STENT INTERVENTION (PCI-S);  Surgeon: [name], MD;  Location: [facility] CATH LAB;  Service: Cardiovascular;  Laterality: Bilateral;   Family History  Problem Relation Age of Onset  . Leukemia Brother   . Cancer Brother     ?  . Diabetes      Both Maternal and Paternal sides   Social History  Substance Use Topics  . Smoking status: Never Smoker   . Smokeless tobacco: Never Used  . Alcohol Use: No   OB History    No data available     Review of Systems  Unable to perform ROS: Dementia  Constitutional: Negative for activity change.      Allergies  Review of patient's allergies indicates no known allergies.  Home Medications   Prior to Admission medications   Medication Sig Start Date End Date Taking? Authorizing Provider  LORazepam (ATIVAN) 1 MG tablet Take 1 mg by mouth every 4 (four) hours as needed for anxiety.  03/18/15  Yes Historical  Provider, MD  Morphine Sulfate (MORPHINE CONCENTRATE) 10 mg / 0.5 ml concentrated solution Take 0.25 mLs by mouth every 2 (two) hours as needed for severe pain.  03/17/15  Yes Historical Provider, MD  polyethylene glycol (MIRALAX / GLYCOLAX) packet Take 17 g by mouth daily as needed for mild constipation or moderate constipation.   Yes Historical Provider, MD  torsemide (DEMADEX) 20 MG tablet Take 1 tablet (20 mg total) by mouth every other day. Patient taking differently: Take 20 mg by mouth daily as needed (FLUID/SWELLING).  03/07/15  Yes Rhetta Mura, MD  atorvastatin (LIPITOR) 20 MG tablet TAKE 1 TABLET BY MOUTH DAILY AS DIRECTED Patient not taking: Reported on  03/30/2015 09/24/14   [name], MD  isosorbide mononitrate (IMDUR) 60 MG 24 hr tablet TAKE 1 TABLET BY MOUTH EVERY MORNING AND TAKE 1/2 TABLET BY MOUTH EVERY EVENING Patient not taking: Reported on 03/30/2015 06/15/14   [name], MD  levETIRAcetam (KEPPRA) 750 MG tablet Take 1 tablet (750 mg total) by mouth 2 (two) times daily. Patient not taking: Reported on 03/30/2015 06/03/12   Geoffry Paradise, MD  NITROSTAT 0.4 MG SL tablet DISSOLVE 1 TABLET UNDER THE TONGUE EVERY 5 MINUTES AS NEEDED FOR CHEST FOR PAIN AS DIRECTED Patient not taking: Reported on 03/30/2015 11/27/14   [name], MD  polyethylene glycol powder (GLYCOLAX/MIRALAX) powder TAKE 6 CAPFULS OF MIRALAX IN A 32 OUNCE GATORADE AND DRINK THE WHOLE BEVERAGE FOLLOWED BY 3 CAPFULS TWICE A DAY FOR THE NEXT WEEK AND FOLLOW UP WITH YOUR PRIMARY CARE PHYSICIAN. Patient not taking: Reported on 03/30/2015 07/21/14   Lyndal Pulley, MD  ranolazine (RANEXA) 500 MG 12 hr tablet Take 1 tablet (500 mg total) by mouth 3 (three) times daily. Patient not taking: Reported on 03/30/2015 02/25/15   [name], MD   BP 101/50 mmHg  Pulse 63  Temp(Src) 97.9 F (36.6 C) (Oral)  Resp 18  Ht 4\' 8"  (1.422 m)  Wt 70 lb (31.752 kg)  BMI 15.70 kg/m2  SpO2 100% Physical Exam  Constitutional: She appears well-developed and well-nourished.  Frail elderly demented female.  HENT:  Head: Normocephalic and atraumatic.  Eyes: Conjunctivae are normal. Right eye exhibits no discharge.  Neck: Neck supple.  Cardiovascular: Normal rate, regular rhythm and normal heart sounds.   No murmur heard. Pulmonary/Chest: Effort normal and breath sounds normal. She has no wheezes. She has no rales.  Abdominal: Soft. She exhibits no distension. There is no tenderness.  Musculoskeletal: Normal range of motion. She exhibits no edema.  Small abrasion to right forearm.  Neurological: No cranial nerve deficit.  oreinted X2  Skin: Skin is warm and dry. No rash noted. She is  not diaphoretic.  Aside from an abrasion to right forearm, no external signs of trauma. Moving all four extremities  Psychiatric: She has a normal mood and affect.  Nursing note and vitals reviewed.   ED Course  Procedures (including critical care time) Labs Review Labs Reviewed  CBC WITH DIFFERENTIAL/PLATELET - Abnormal; Notable for the following:    HCT 34.5 (*)    All other components within normal limits  COMPREHENSIVE METABOLIC PANEL - Abnormal; Notable for the following:    Potassium 2.8 (*)    Glucose, Bld 274 (*)    BUN <5 (*)    Total Protein 5.8 (*)    All other components within normal limits  URINALYSIS, ROUTINE W REFLEX MICROSCOPIC (NOT AT [facility]) - Abnormal; Notable for the following:  Glucose, UA >1000 (*)    Hgb urine dipstick SMALL (*)    Protein, ur 100 (*)    All other components within normal limits  URINE MICROSCOPIC-ADD ON - Abnormal; Notable for the following:    Squamous Epithelial / LPF 0-5 (*)    Bacteria, UA RARE (*)    All other components within normal limits  URINE CULTURE  PROTIME-INR    Imaging Review Dg Chest 2 View  03/30/2015  CLINICAL DATA:  Patient found on floor at home.  Mid back pain. EXAM: CHEST  2 VIEW COMPARISON:  March 12, 2015. FINDINGS: Stable cardiomediastinal silhouette. No pneumothorax or pleural effusion is noted. Atherosclerosis of thoracic aorta is noted. No acute pulmonary disease is noted. Bony thorax is intact. IMPRESSION: No active cardiopulmonary disease. Electronically Signed   By: Lupita Raider, M.D.   On: 03/30/2015 15:38   Dg Pelvis 1-2 Views  03/30/2015  CLINICAL DATA:  Fall, found on floor. EXAM: PELVIS - 1-2 VIEW COMPARISON:  CT 07/21/2014 FINDINGS: Mild symmetric degenerative changes in the hips bilaterally. Diffuse osteopenia. SI joints are symmetric and unremarkable. No acute bony abnormality. Specifically, no fracture, subluxation, or dislocation. Soft tissues are intact. IMPRESSION: No acute bony  abnormality. Electronically Signed   By: Charlett Nose M.D.   On: 03/30/2015 15:37   Ct Head Wo Contrast  03/30/2015  CLINICAL DATA:  Pain following fall EXAM: CT HEAD WITHOUT CONTRAST CT CERVICAL SPINE WITHOUT CONTRAST TECHNIQUE: Multidetector CT imaging of the head and cervical spine was performed following the standard protocol without intravenous contrast. Multiplanar CT image reconstructions of the cervical spine were also generated. COMPARISON:  Cervical spine CT May 28, 2012; head CT March 05, 2015 FINDINGS: CT HEAD FINDINGS There is moderate diffuse atrophy. There is a new 6 x 6 mm hemorrhage just medial to the temporal horn of the left lateral ventricle in the medial superior left temporal lobe. No other hemorrhage is apparent. There is no appreciable mass effect. There is no extra-axial fluid collection or midline shift. There is patchy small vessel disease throughout the centra semiovale bilaterally. Elsewhere gray-white compartments appear normal. No acute infarct evident. The bony calvarium appears intact. The mastoid air cells are clear. No intraorbital lesions are evident. CT CERVICAL SPINE FINDINGS There is no demonstrable fracture or spondylolisthesis. Prevertebral soft tissues and predental space regions are normal. There is marked pannus surrounding the posterior and superior aspects of the odontoid. There is a cystic area in the posterior odontoid, stable in appearance. There is mild disc space narrowing at C5-6. There is facet hypertrophy at multiple levels bilaterally. No disc extrusion or stenosis. There are foci of carotid artery calcification bilaterally. There are small thyroid nodular lesions consistent with multinodular goiter. No dominant thyroid mass. IMPRESSION: CT head: New 6 x 6 mm hemorrhage in the medial left temporal lobe just medial to the temporal horn of the left lateral ventricle. No other hemorrhage. There is atrophy with patchy periventricular small vessel disease  which is stable. No extra-axial fluid or midline shift. CT cervical spine: No fracture or spondylolisthesis. Areas of arthropathy, stable. Extensive pannus in the region of the odontoid. Cystic change in the posterior odontoid, stable. There is bilateral carotid artery calcification. Stable multinodular goiter without dominant mass. Critical Value/emergent results were called by telephone at the time of interpretation on 03/30/2015 at 4:22 pm to Dr. Bary Castilla , who verbally acknowledged these results. Electronically Signed   By: Bretta Bang III M.D.   On:  03/30/2015 16:24   Ct Cervical Spine Wo Contrast  03/30/2015  CLINICAL DATA:  Pain following fall EXAM: CT HEAD WITHOUT CONTRAST CT CERVICAL SPINE WITHOUT CONTRAST TECHNIQUE: Multidetector CT imaging of the head and cervical spine was performed following the standard protocol without intravenous contrast. Multiplanar CT image reconstructions of the cervical spine were also generated. COMPARISON:  Cervical spine CT May 28, 2012; head CT March 05, 2015 FINDINGS: CT HEAD FINDINGS There is moderate diffuse atrophy. There is a new 6 x 6 mm hemorrhage just medial to the temporal horn of the left lateral ventricle in the medial superior left temporal lobe. No other hemorrhage is apparent. There is no appreciable mass effect. There is no extra-axial fluid collection or midline shift. There is patchy small vessel disease throughout the centra semiovale bilaterally. Elsewhere gray-white compartments appear normal. No acute infarct evident. The bony calvarium appears intact. The mastoid air cells are clear. No intraorbital lesions are evident. CT CERVICAL SPINE FINDINGS There is no demonstrable fracture or spondylolisthesis. Prevertebral soft tissues and predental space regions are normal. There is marked pannus surrounding the posterior and superior aspects of the odontoid. There is a cystic area in the posterior odontoid, stable in appearance. There is  mild disc space narrowing at C5-6. There is facet hypertrophy at multiple levels bilaterally. No disc extrusion or stenosis. There are foci of carotid artery calcification bilaterally. There are small thyroid nodular lesions consistent with multinodular goiter. No dominant thyroid mass. IMPRESSION: CT head: New 6 x 6 mm hemorrhage in the medial left temporal lobe just medial to the temporal horn of the left lateral ventricle. No other hemorrhage. There is atrophy with patchy periventricular small vessel disease which is stable. No extra-axial fluid or midline shift. CT cervical spine: No fracture or spondylolisthesis. Areas of arthropathy, stable. Extensive pannus in the region of the odontoid. Cystic change in the posterior odontoid, stable. There is bilateral carotid artery calcification. Stable multinodular goiter without dominant mass. Critical Value/emergent results were called by telephone at the time of interpretation on 03/30/2015 at 4:22 pm to Dr. Bary Castilla , who verbally acknowledged these results. Electronically Signed   By: Bretta Bang III M.D.   On: 03/30/2015 16:24   I have personally reviewed and evaluated these images and lab results as part of my medical decision-making.   EKG Interpretation None      MDM   Final diagnoses:  Fall, initial encounter  Traumatic hemorrhage of cerebrum without loss of consciousness, unspecified laterality, initial encounter [facility])   Patient is a 82 year old female with dementia on hospice. She is presenting with 2 falls today. Patient is unsteady at baseline. Sent here for evaluation by hospice make sure that she has no fractures. Patient had no external signs of trauma except for small palpation on right forearm. She has no tenderness to palpation on exam.  We will get CT head and neck given her age. Patient is DNR/DNI the patient's son requesting small  workup for fall.We'll get x-ray of chest and EKG urine and basic labs.      10:30  PM [facility] noted. Neurosurgery paged.   Neurosurgery said nothing to do, wouldn't repeat scan.  Mildly hypokalemia. Treated with liquid K.   Discussed with son.  We would not do any interventions in hospital.  Will discharge home with hospice and son.   Milam Allbaugh Randall An, MD 03/30/15 2231

## 2015-03-30 NOTE — ED Notes (Signed)
Per [facility] pt from home had fall between 1215 and 1345, son found pt on floor. Pt reports mid back pain. Hx dementia and confused to baseline . Denies neck pain. Unknown head injury nor loc. Pt alert yet disoriented x 3, answers to her name only per [facility]. No obvious extremity deformity nor further injury.

## 2015-03-30 NOTE — ED Notes (Signed)
MD at bedside. 

## 2015-03-30 NOTE — Discharge Instructions (Signed)
We gave patient potassium today. Please have her follow-up with her primary care physician to get her potassium checked again in 2 days' time. Patient also had small bleeding on her brain. If she has any change in mental status including increased confusion, sleepiness please return immediately to the emergency department at needed.    Intracranial Hemorrhage An intracranial hemorrhage is bleeding in the layers between the skull (cranium) and brain. A blood vessel bursts and allows blood to leak inside the cranial cavity. The leaking blood then collects (hematoma). This causes pressure and damage to brain cells. The bleeding can be mild to severe. In severe cases, it can lead to permanent damage or death. Symptoms may come on suddenly or develop over time. Early diagnosis and treatment leads to better recovery.  There are four types of intracranial hemorrhage: subarachnoid, subdural, extradural, or cerebral hemorrhage. CAUSES   Head injury (trauma).  Ruptured brain aneurysm.  Bleeding from blood vessels that develop abnormally (arteriovenous malformation).  Bleeding disorder.  Use of blood thinners (anticoagulants).  Use of certain drugs, such as cocaine. For some people with intracranial hemorrhage, the cause is unknown.  RISK FACTORS  Using tobacco products, such as cigarettes and chewing tobacco.  Having high blood pressure (hypertension).  Abusing alcohol.  Being a female, especially of postmenopausal age.  Having a family history of disease in the blood vessels of the brain (cerebrovascular disease).  Having certain genetic syndromes that result in kidney disease or connective tissue disease. SIGNS AND SYMPTOMS  A sudden, severe headache with no known cause. The headache is often described as the worst headache ever experienced.  Nausea or vomiting, especially when combined with other symptoms such as a headache.  Sudden weakness or numbness of the face, arm, or leg,  especially on one side of the body.  Sudden trouble walking or difficulty moving arms or legs.  Sudden confusion.  Sudden personality changes.  Trouble speaking (aphasia) or understanding.  Difficulty swallowing.  Sudden trouble seeing in one or both eyes.  Double vision.  Dizziness.  Loss of balance or coordination.  Intolerance to light.  Stiff neck. DIAGNOSIS  Your health care provider will perform a physical exam and ask about your symptoms. If an intracranial hemorrhage is suspected, various tests may be ordered. These tests may include:   A CT scan.  An MRI.  A cerebral angiogram.  A spinal tap (lumbar puncture).  Blood tests. TREATMENT Immediate treatment in the hospital is often required to reduce the risk of brain damage. Treatment will depend on the cause of the bleeding, where it is located, and the extent of the bleeding and damage. The goals of treatment include stopping the bleeding, repairing the cause of bleeding, providing relief of symptoms, and preventing problems.   Medicines may be given to:  Lower blood pressure (antihypertensives).  Relieve pain (analgesics).  Relieve nausea or vomiting.  Surgery may be needed to stop the bleeding, repair the cause of the bleeding, or remove the blood.  Rehabilitation may be needed to improve any cognitive and day-to-day functions impaired by the condition. Further treatment depends on the duration, severity, and cause of your symptoms. Physical, speech, and occupational therapists will assess you and work to improve any functions impaired by the intracranial hemorrhage. Measures will be taken to prevent short-term and long-term problems, including infection from breathing foreign material into the lungs (aspiration pneumonia), blood clots in the legs, bedsores, and falls. HOME CARE INSTRUCTIONS  Take medicines only as directed by your  health care provider.  Eat healthy foods as directed by your health care  provider:  A diet low in salt (sodium), saturated fat, trans fat, and cholesterol may be recommended to manage your blood pressure.  Foods may need to be soft or pureed, or small bites may need to be taken in order to avoid aspirating or choking.  If studies show that your ability to swallow safely has been affected, you may need to seek help from specialists such as a dietitian, speech and language pathologist, or an occupational therapist. These health care providers can teach you how to safely get the nutrition your body needs.  Rest and limit activities or movements as directed by your health care provider.  Do not use any tobacco products including cigarettes, chewing tobacco, or electronic cigarettes. If you need help quitting, ask your health care provider.  Limit alcohol intake to no more than 1 drink per day for nonpregnant women and 2 drinks per day for men. One drink equals 12 ounces of beer, 5 ounces of wine, or 1 ounces of hard liquor.  Make any other lifestyle changes as directed by your health care provider.  Monitor and record your blood pressure as directed by your health care provider.  A safe home environment is important to reduce the risk of falls. Your health care provider may arrange for specialists to evaluate your home. Having grab bars in the bedroom and bathroom is often important. Your health care provider may arrange for special equipment to be used at home, such as raised toilets and a seat for the shower.  Do physical, occupational, and speech therapy as directed by your health care provider. Ongoing therapy may be needed to maximize your recovery.  Use a walker or a cane at all times if directed by your health care provider.  Keep all follow-up visits with your health care provider and other specialists. This is important. This includes any referrals, physical therapy, and rehabilitation. SEEK IMMEDIATE MEDICAL CARE IF:   You have a sudden, severe headache  with no known cause.  You have nausea or vomiting occurring with another symptom.  You have sudden weakness or numbness of the face, arm, or leg, especially on one side of the body.  You have sudden trouble walking or difficulty moving your arms or legs.  You have sudden confusion.  You have trouble speaking (aphasia) or understanding.  You have sudden trouble seeing in one or both eyes.  You have a sudden loss of balance or coordination.  You have a stiff neck.  You have difficulty breathing.  You have a partial or total loss of consciousness. These symptoms may represent a serious problem that is an emergency. Do not wait to see if the symptoms will go away. Get medical help right away. Call your local emergency services (911 in the [place] Do not drive yourself to the hospital.   This information is not intended to replace advice given to you by your health care provider. Make sure you discuss any questions you have with your health care provider.   Document Released: 08/27/2013 Document Reviewed: 08/27/2013 Elsevier Interactive Patient Education [employer]

## 2015-03-30 NOTE — ED Notes (Signed)
Bed: ZO10 Expected date:  Expected time:  Means of arrival:  Comments: 53F/fall/c-collar

## 2015-03-31 LAB — URINE CULTURE

## 2015-04-20 ENCOUNTER — Ambulatory Visit: Payer: Medicare Other | Admitting: Cardiovascular Disease

## 2015-05-15 DEATH — deceased

## 2017-03-26 IMAGING — CT CT HEAD W/O CM
3 of 4 series · 16 of 30 positions shown, 17 images · non-contrast
Comparison: CTA of the head performed 05/28/2012, and MRI of the
brain performed 05/29/2012

CLINICAL DATA: Acute onset of difficulty breathing. Dizziness when
standing up. Initial encounter.

EXAM:
CT HEAD WITHOUT CONTRAST
TECHNIQUE: Contiguous axial images were obtained from the base of the skull
through the vertex without intravenous contrast.

[Series 2: head without · axial · non-contrast · 0.42mm/px · z∈[+1083,+1173]mm · 4 of 31 slices shown, 5 images]
[im 7/31  brain]
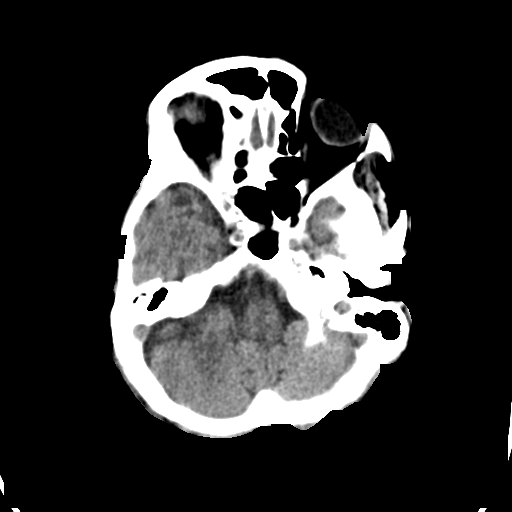
[im 7/31  bone]
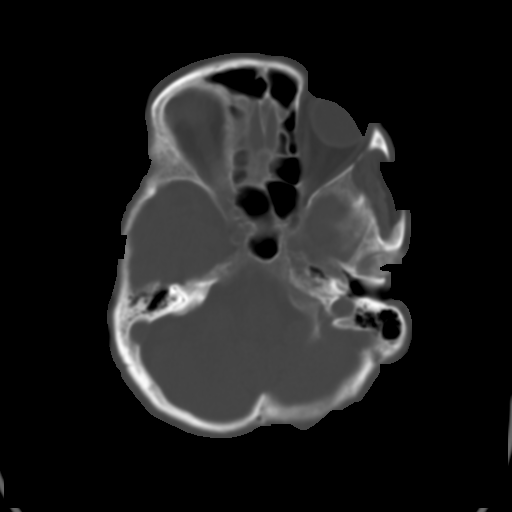
[im 13/31  brain]
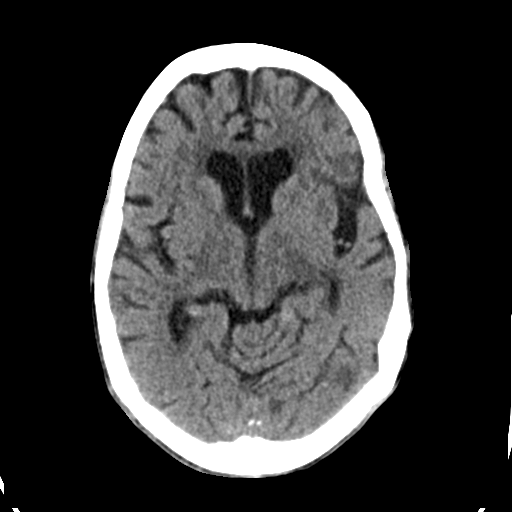
[im 19/31  brain]
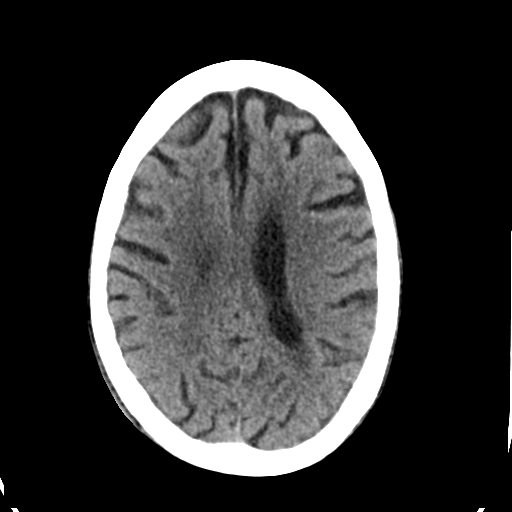
[im 25/31  brain]
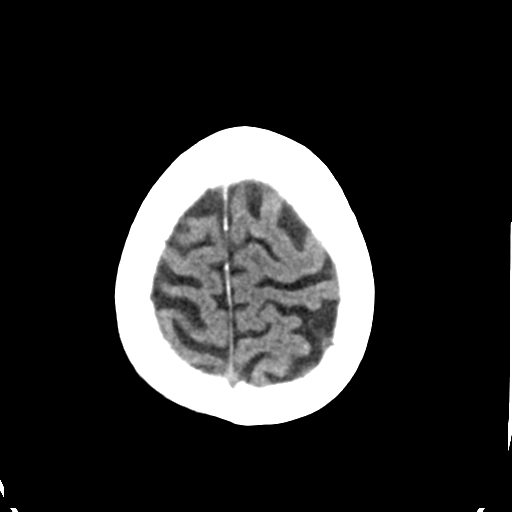

[Series 3: head bone · axial · 0.42mm/px · z∈[+1063,+1191]mm · 8 of 76 slices shown (1 of 2)]
[im 6/76  bone]
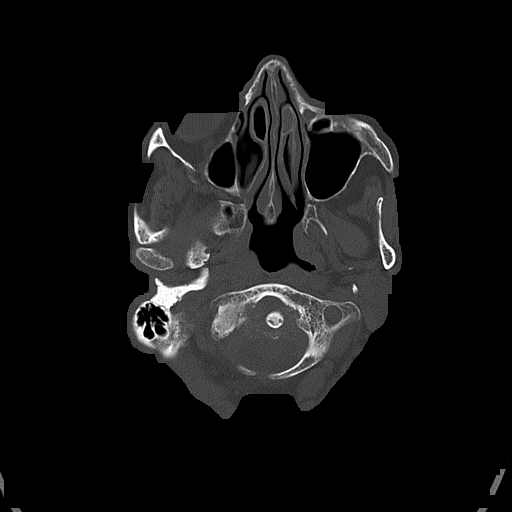
[im 17/76  bone]
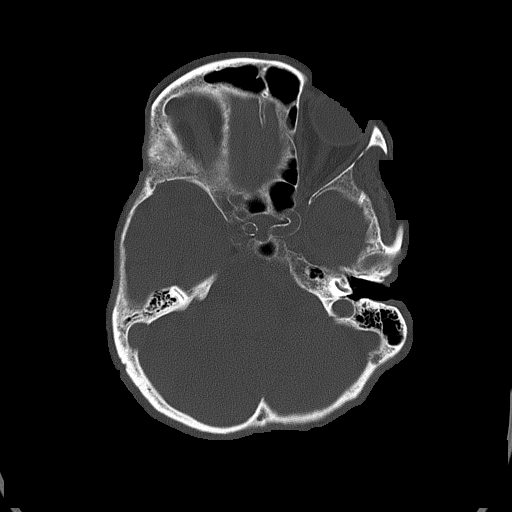
[im 27/76  bone]
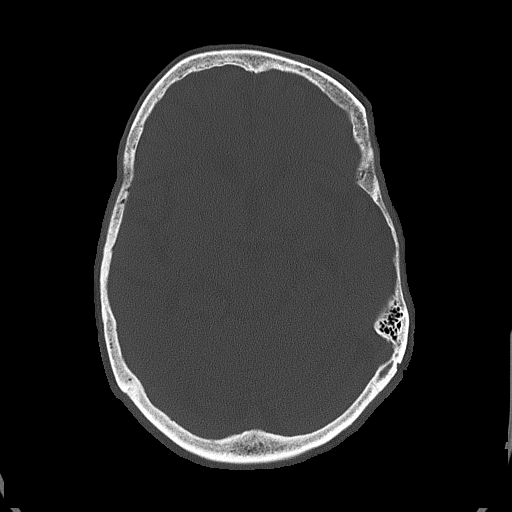
[im 33/76  bone]
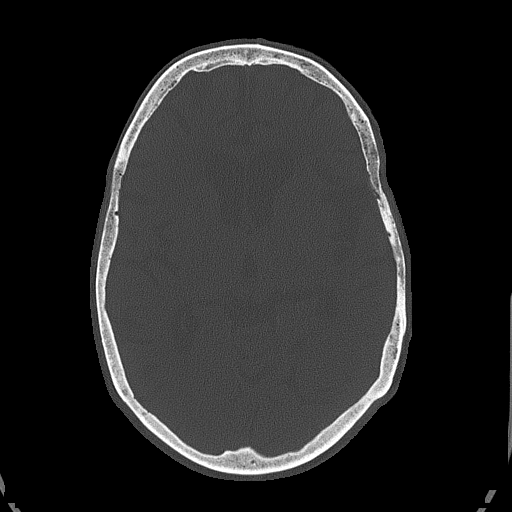
[im 43/76  bone]
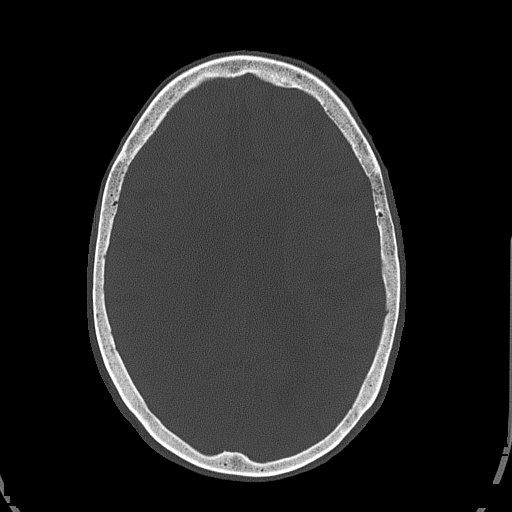
[im 49/76  bone]
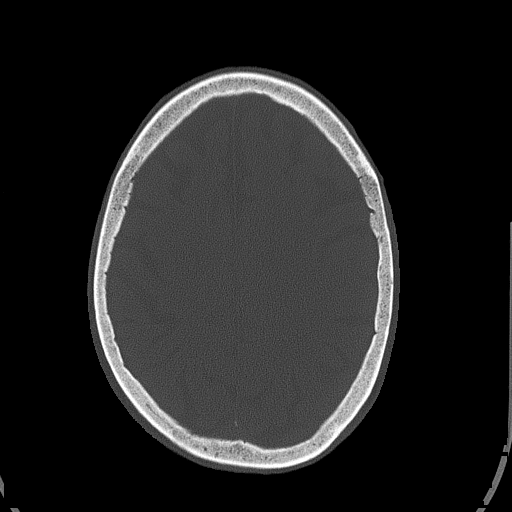
[im 59/76  bone]
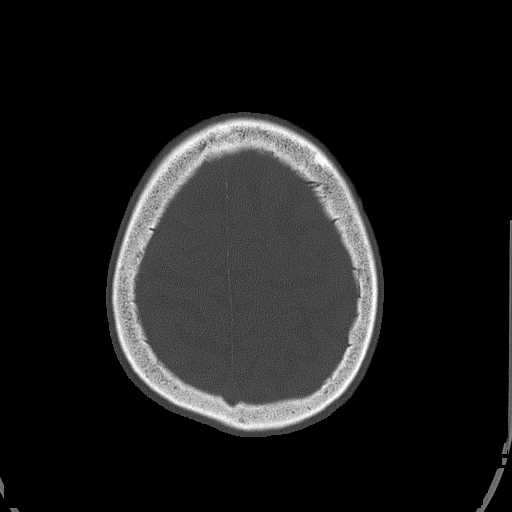
[im 70/76  bone]
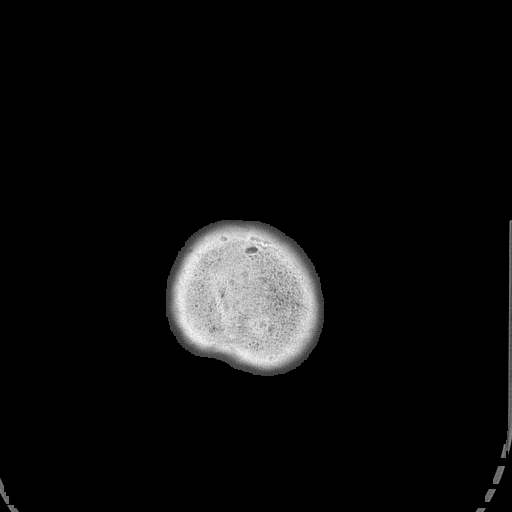

[Series 5: head bone · axial · 0.42mm/px · z∈[+1065,+1101]mm · 4 of 31 slices shown (2 of 2)]
[im 7/31  bone]
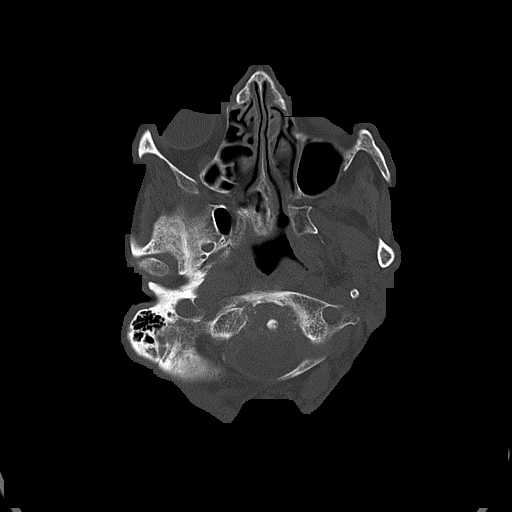
[im 13/31  bone]
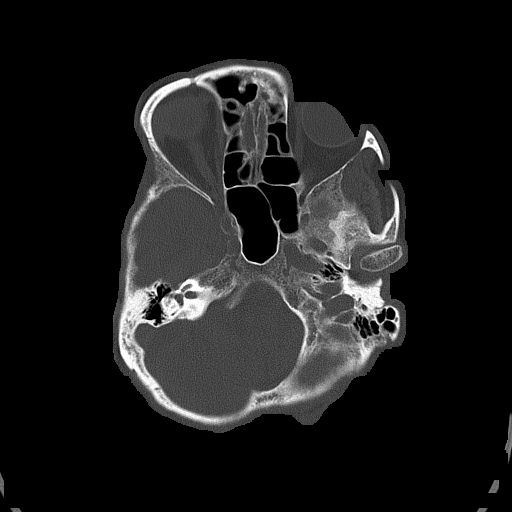
[im 19/31  bone]
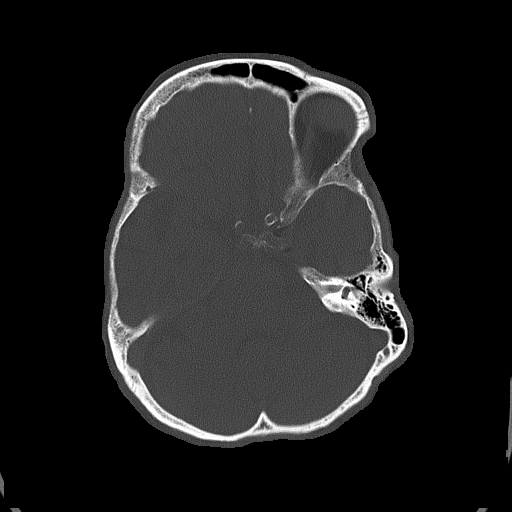
[im 25/31  bone]
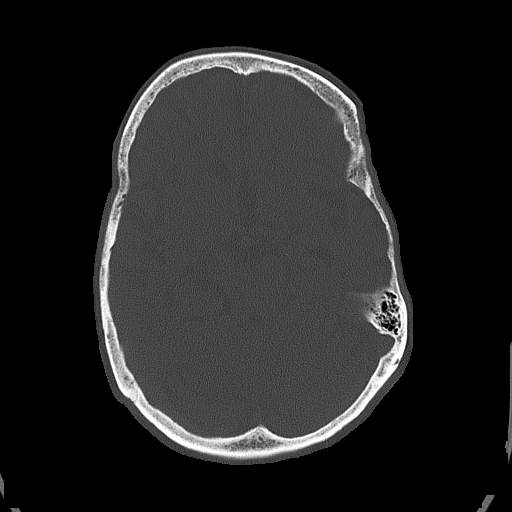

[16 of 30 positions shown; findings below may reference images not displayed]

FINDINGS: There is no evidence of acute infarction, mass lesion, or intra- or
extra-axial hemorrhage on CT.

Prominence of the ventricles and sulci reflects mild to moderate
cortical volume loss. Mild cerebellar atrophy is noted. Scattered
periventricular and subcortical white matter change likely reflects
small vessel ischemic microangiopathy. Small chronic lacunar
infarcts are noted in the right basal ganglia.

The brainstem and fourth ventricle are within normal limits. The
cerebral hemispheres demonstrate grossly normal gray-white
differentiation. No mass effect or midline shift is seen.

There is no evidence of fracture; visualized osseous structures are
unremarkable in appearance. The orbits are within normal limits. The
paranasal sinuses and mastoid air cells are well-aerated. No
significant soft tissue abnormalities are seen.
IMPRESSION: 1. No acute intracranial pathology seen on CT.
2. Mild to moderate cortical volume loss and scattered small vessel
ischemic microangiopathy.
3. Small chronic lacunar infarcts at the right basal ganglia.

## 2017-03-26 IMAGING — CR DG CHEST 2V
2 series · 2 of 2 positions shown · non-contrast
Comparison: 05/28/2012

CLINICAL DATA: Intermittent chest pain and back pain for several
months

EXAM:
CHEST  2 VIEW

[chest lat]
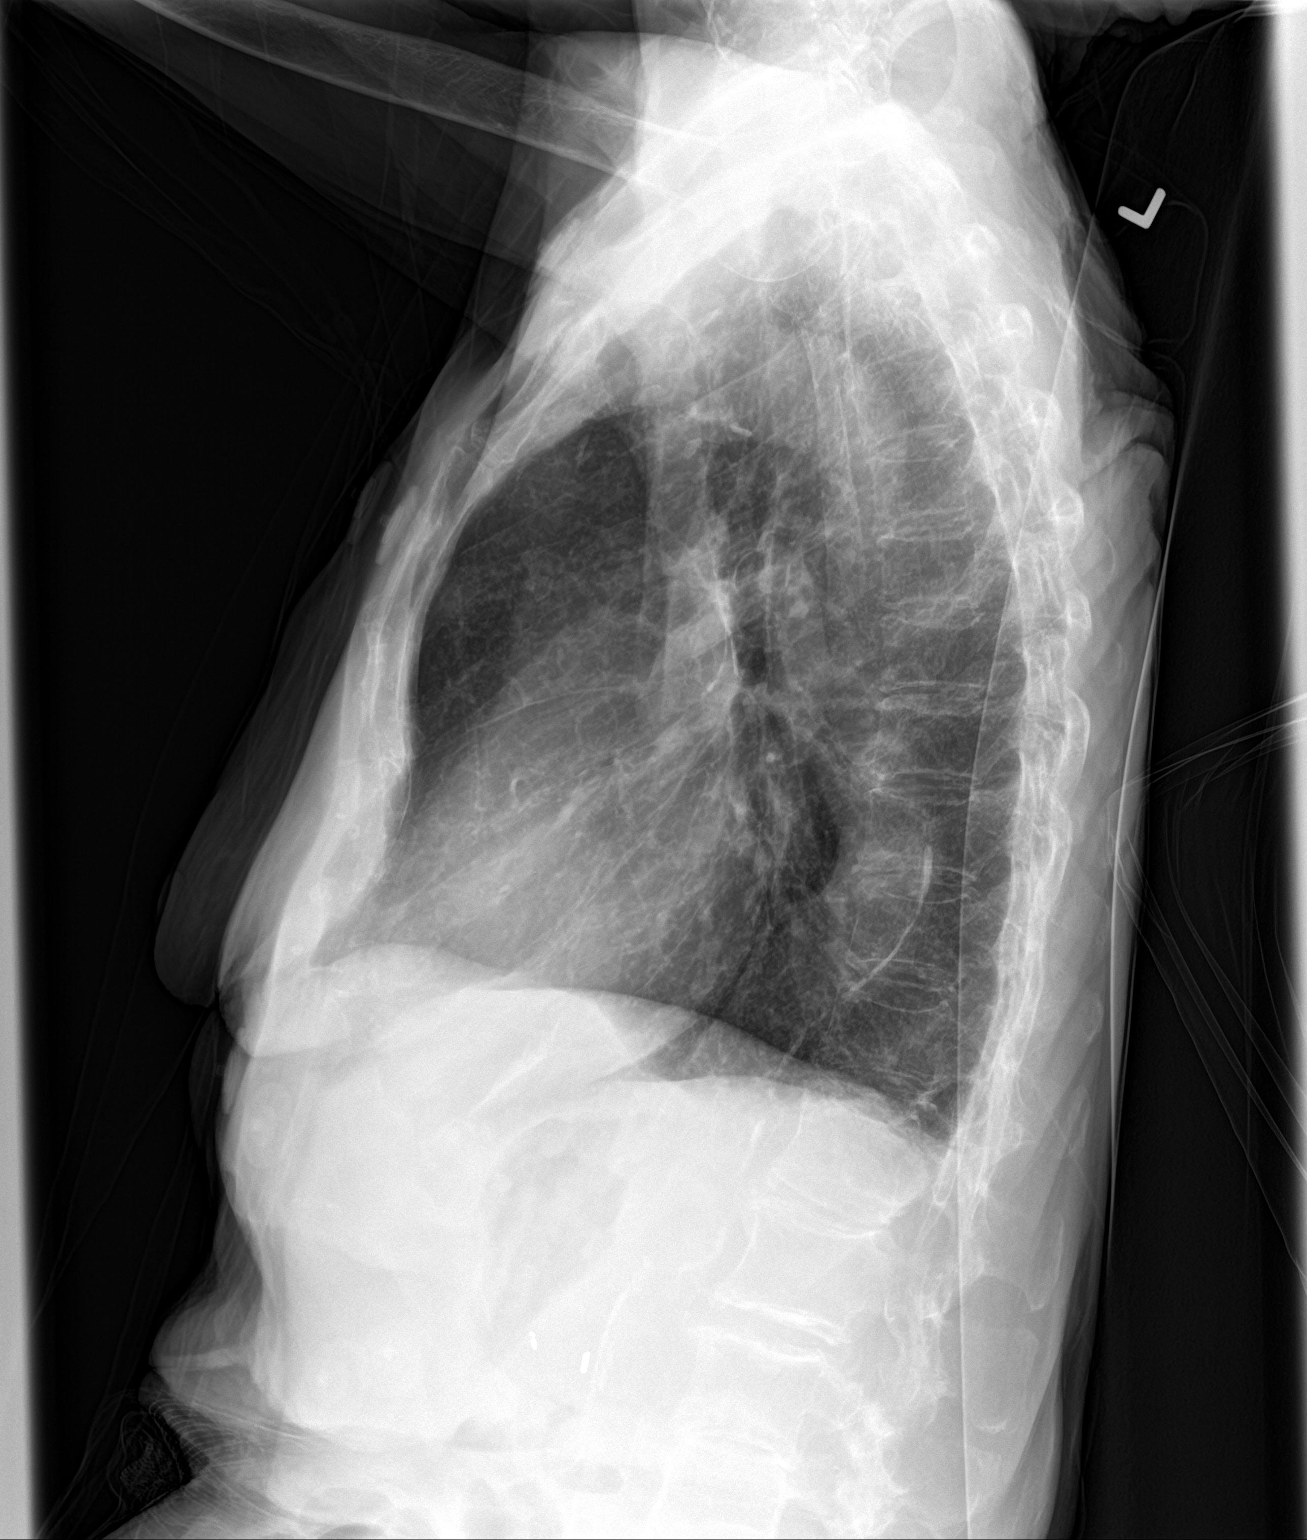

[chest ap]
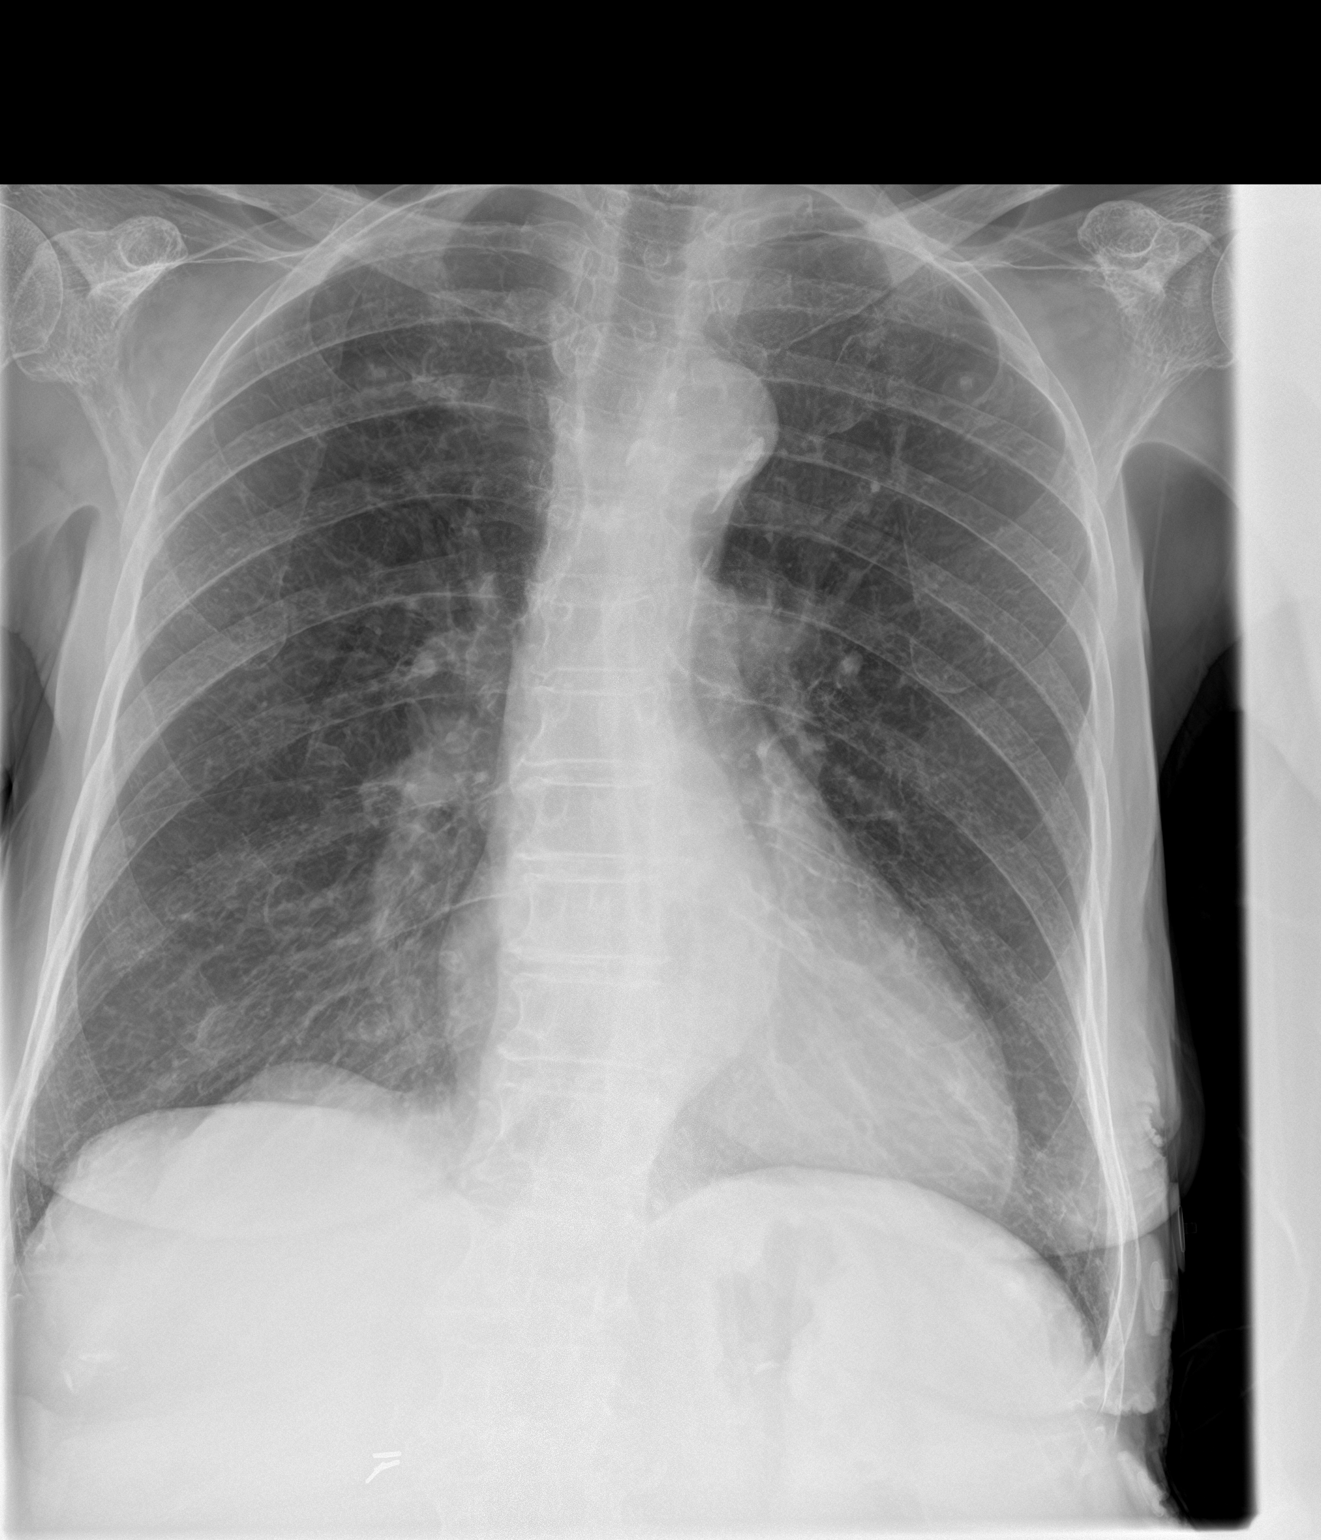

[2 of 2 positions shown; findings below may reference images not displayed]

FINDINGS: Cardiomediastinal silhouette is stable. No acute infiltrate or
pleural effusion. No pulmonary edema. Atherosclerotic calcifications
of thoracic aorta. Mild degenerative changes lower thoracic spine.
Diffuse osteopenia. Stable hyperinflation and chronic mild
interstitial prominence.
IMPRESSION: No active cardiopulmonary disease. Stable hyperinflation and chronic
mild interstitial prominence.
